# Patient Record
Sex: Male | Born: 1955 | Race: White | Hispanic: No | Marital: Married | Smoking: Current every day smoker
Health system: Southern US, Community
[De-identification: ages and names within clinical notes are randomized; demographics above are authoritative.]

## PROBLEM LIST (undated history)

## (undated) DIAGNOSIS — I898 Other specified noninfective disorders of lymphatic vessels and lymph nodes: Secondary | ICD-10-CM

## (undated) DIAGNOSIS — Z95810 Presence of automatic (implantable) cardiac defibrillator: Secondary | ICD-10-CM

## (undated) DIAGNOSIS — R739 Hyperglycemia, unspecified: Secondary | ICD-10-CM

## (undated) DIAGNOSIS — I2510 Atherosclerotic heart disease of native coronary artery without angina pectoris: Secondary | ICD-10-CM

## (undated) DIAGNOSIS — J189 Pneumonia, unspecified organism: Secondary | ICD-10-CM

## (undated) DIAGNOSIS — Z955 Presence of coronary angioplasty implant and graft: Secondary | ICD-10-CM

## (undated) DIAGNOSIS — M1990 Unspecified osteoarthritis, unspecified site: Secondary | ICD-10-CM

## (undated) DIAGNOSIS — R519 Headache, unspecified: Secondary | ICD-10-CM

## (undated) DIAGNOSIS — R51 Headache: Secondary | ICD-10-CM

## (undated) DIAGNOSIS — I469 Cardiac arrest, cause unspecified: Secondary | ICD-10-CM

## (undated) DIAGNOSIS — I213 ST elevation (STEMI) myocardial infarction of unspecified site: Secondary | ICD-10-CM

## (undated) DIAGNOSIS — C3490 Malignant neoplasm of unspecified part of unspecified bronchus or lung: Secondary | ICD-10-CM

## (undated) DIAGNOSIS — I472 Ventricular tachycardia: Secondary | ICD-10-CM

## (undated) DIAGNOSIS — I255 Ischemic cardiomyopathy: Secondary | ICD-10-CM

## (undated) DIAGNOSIS — E039 Hypothyroidism, unspecified: Secondary | ICD-10-CM

## (undated) DIAGNOSIS — Z9289 Personal history of other medical treatment: Secondary | ICD-10-CM

## (undated) DIAGNOSIS — K219 Gastro-esophageal reflux disease without esophagitis: Secondary | ICD-10-CM

## (undated) DIAGNOSIS — R7989 Other specified abnormal findings of blood chemistry: Secondary | ICD-10-CM

## (undated) HISTORY — PX: NO PAST SURGERIES: SHX2092

## (undated) HISTORY — DX: Personal history of other medical treatment: Z92.89

---

## 2008-11-22 DIAGNOSIS — C3490 Malignant neoplasm of unspecified part of unspecified bronchus or lung: Secondary | ICD-10-CM

## 2008-11-22 HISTORY — DX: Malignant neoplasm of unspecified part of unspecified bronchus or lung: C34.90

## 2008-12-23 ENCOUNTER — Ambulatory Visit: Payer: Self-pay | Admitting: Oncology

## 2008-12-31 ENCOUNTER — Ambulatory Visit: Payer: Self-pay | Admitting: Diagnostic Radiology

## 2008-12-31 ENCOUNTER — Ambulatory Visit (HOSPITAL_BASED_OUTPATIENT_CLINIC_OR_DEPARTMENT_OTHER): Admission: RE | Admit: 2008-12-31 | Discharge: 2008-12-31 | Payer: Self-pay | Admitting: Family Medicine

## 2009-01-01 ENCOUNTER — Ambulatory Visit: Payer: Self-pay | Admitting: Cardiothoracic Surgery

## 2009-01-02 ENCOUNTER — Ambulatory Visit: Payer: Self-pay | Admitting: Oncology

## 2009-01-03 ENCOUNTER — Ambulatory Visit: Payer: Self-pay | Admitting: General Surgery

## 2009-01-06 ENCOUNTER — Ambulatory Visit: Payer: Self-pay | Admitting: General Surgery

## 2009-01-20 ENCOUNTER — Ambulatory Visit: Payer: Self-pay | Admitting: Oncology

## 2009-01-20 ENCOUNTER — Ambulatory Visit: Payer: Self-pay | Admitting: Cardiothoracic Surgery

## 2009-02-20 ENCOUNTER — Ambulatory Visit: Payer: Self-pay | Admitting: Oncology

## 2009-02-20 ENCOUNTER — Ambulatory Visit: Payer: Self-pay | Admitting: Cardiothoracic Surgery

## 2009-03-22 ENCOUNTER — Ambulatory Visit: Payer: Self-pay | Admitting: Cardiothoracic Surgery

## 2009-03-22 ENCOUNTER — Ambulatory Visit: Payer: Self-pay | Admitting: Oncology

## 2009-04-22 ENCOUNTER — Ambulatory Visit: Payer: Self-pay | Admitting: Cardiothoracic Surgery

## 2009-04-22 ENCOUNTER — Ambulatory Visit: Payer: Self-pay | Admitting: Oncology

## 2009-05-22 ENCOUNTER — Ambulatory Visit: Payer: Self-pay | Admitting: Cardiothoracic Surgery

## 2009-05-22 ENCOUNTER — Ambulatory Visit: Payer: Self-pay | Admitting: Oncology

## 2009-06-22 ENCOUNTER — Ambulatory Visit: Payer: Self-pay | Admitting: Oncology

## 2009-06-22 ENCOUNTER — Ambulatory Visit: Payer: Self-pay | Admitting: Cardiothoracic Surgery

## 2009-07-23 ENCOUNTER — Ambulatory Visit: Payer: Self-pay | Admitting: Oncology

## 2009-07-23 ENCOUNTER — Ambulatory Visit: Payer: Self-pay | Admitting: Cardiothoracic Surgery

## 2009-10-22 ENCOUNTER — Ambulatory Visit: Payer: Self-pay | Admitting: Internal Medicine

## 2009-11-05 ENCOUNTER — Ambulatory Visit: Payer: Self-pay | Admitting: Radiation Oncology

## 2009-11-20 ENCOUNTER — Ambulatory Visit: Payer: Self-pay | Admitting: Internal Medicine

## 2009-11-22 ENCOUNTER — Ambulatory Visit: Payer: Self-pay | Admitting: Internal Medicine

## 2009-12-19 ENCOUNTER — Ambulatory Visit: Payer: Self-pay | Admitting: Oncology

## 2009-12-23 ENCOUNTER — Ambulatory Visit: Payer: Self-pay | Admitting: Oncology

## 2009-12-23 ENCOUNTER — Ambulatory Visit: Payer: Self-pay | Admitting: Internal Medicine

## 2010-03-22 ENCOUNTER — Ambulatory Visit: Payer: Self-pay | Admitting: Oncology

## 2010-03-23 ENCOUNTER — Ambulatory Visit: Payer: Self-pay | Admitting: Oncology

## 2010-04-17 ENCOUNTER — Ambulatory Visit: Payer: Self-pay | Admitting: Oncology

## 2010-04-22 ENCOUNTER — Ambulatory Visit: Payer: Self-pay | Admitting: Oncology

## 2010-06-22 ENCOUNTER — Ambulatory Visit: Payer: Self-pay | Admitting: Oncology

## 2010-07-22 ENCOUNTER — Ambulatory Visit: Payer: Self-pay | Admitting: Cardiothoracic Surgery

## 2010-07-23 ENCOUNTER — Ambulatory Visit: Payer: Self-pay | Admitting: Cardiothoracic Surgery

## 2010-09-09 ENCOUNTER — Ambulatory Visit: Payer: Self-pay | Admitting: Oncology

## 2010-09-24 ENCOUNTER — Ambulatory Visit: Payer: Self-pay | Admitting: Oncology

## 2010-10-22 ENCOUNTER — Ambulatory Visit: Payer: Self-pay | Admitting: Oncology

## 2010-11-22 ENCOUNTER — Ambulatory Visit: Payer: Self-pay | Admitting: Oncology

## 2012-07-23 DIAGNOSIS — J189 Pneumonia, unspecified organism: Secondary | ICD-10-CM

## 2012-07-23 HISTORY — PX: CORONARY ANGIOPLASTY WITH STENT PLACEMENT: SHX49

## 2012-07-23 HISTORY — DX: Pneumonia, unspecified organism: J18.9

## 2012-08-13 ENCOUNTER — Inpatient Hospital Stay (HOSPITAL_COMMUNITY)
Admission: EM | Admit: 2012-08-13 | Discharge: 2012-09-07 | DRG: 550 | Disposition: A | Discharge status: Home / Self Care | Payer: BC Managed Care – PPO | Attending: Cardiovascular Disease | Admitting: Cardiovascular Disease

## 2012-08-13 ENCOUNTER — Inpatient Hospital Stay (HOSPITAL_COMMUNITY): Payer: BC Managed Care – PPO

## 2012-08-13 ENCOUNTER — Encounter (HOSPITAL_COMMUNITY)
Admission: EM | Disposition: A | Discharge status: Home / Self Care | Payer: Self-pay | Source: Home / Self Care | Attending: Cardiovascular Disease

## 2012-08-13 ENCOUNTER — Emergency Department (HOSPITAL_COMMUNITY): Payer: BC Managed Care – PPO

## 2012-08-13 ENCOUNTER — Encounter (HOSPITAL_COMMUNITY): Payer: Self-pay

## 2012-08-13 DIAGNOSIS — I472 Ventricular tachycardia, unspecified: Secondary | ICD-10-CM | POA: Diagnosis present

## 2012-08-13 DIAGNOSIS — R579 Shock, unspecified: Secondary | ICD-10-CM | POA: Diagnosis present

## 2012-08-13 DIAGNOSIS — I2510 Atherosclerotic heart disease of native coronary artery without angina pectoris: Secondary | ICD-10-CM

## 2012-08-13 DIAGNOSIS — I2589 Other forms of chronic ischemic heart disease: Secondary | ICD-10-CM | POA: Diagnosis present

## 2012-08-13 DIAGNOSIS — I898 Other specified noninfective disorders of lymphatic vessels and lymph nodes: Secondary | ICD-10-CM | POA: Clinically undetermined

## 2012-08-13 DIAGNOSIS — R918 Other nonspecific abnormal finding of lung field: Secondary | ICD-10-CM

## 2012-08-13 DIAGNOSIS — F17200 Nicotine dependence, unspecified, uncomplicated: Secondary | ICD-10-CM | POA: Diagnosis present

## 2012-08-13 DIAGNOSIS — R0902 Hypoxemia: Secondary | ICD-10-CM

## 2012-08-13 DIAGNOSIS — Z85118 Personal history of other malignant neoplasm of bronchus and lung: Secondary | ICD-10-CM

## 2012-08-13 DIAGNOSIS — R7309 Other abnormal glucose: Secondary | ICD-10-CM | POA: Diagnosis not present

## 2012-08-13 DIAGNOSIS — Z950 Presence of cardiac pacemaker: Secondary | ICD-10-CM | POA: Diagnosis present

## 2012-08-13 DIAGNOSIS — Z955 Presence of coronary angioplasty implant and graft: Secondary | ICD-10-CM

## 2012-08-13 DIAGNOSIS — I2119 ST elevation (STEMI) myocardial infarction involving other coronary artery of inferior wall: Principal | ICD-10-CM | POA: Diagnosis present

## 2012-08-13 DIAGNOSIS — J189 Pneumonia, unspecified organism: Secondary | ICD-10-CM | POA: Diagnosis not present

## 2012-08-13 DIAGNOSIS — I213 ST elevation (STEMI) myocardial infarction of unspecified site: Secondary | ICD-10-CM

## 2012-08-13 DIAGNOSIS — E873 Alkalosis: Secondary | ICD-10-CM | POA: Diagnosis not present

## 2012-08-13 DIAGNOSIS — K219 Gastro-esophageal reflux disease without esophagitis: Secondary | ICD-10-CM

## 2012-08-13 DIAGNOSIS — J9600 Acute respiratory failure, unspecified whether with hypoxia or hypercapnia: Secondary | ICD-10-CM | POA: Diagnosis not present

## 2012-08-13 DIAGNOSIS — I4729 Other ventricular tachycardia: Secondary | ICD-10-CM | POA: Diagnosis present

## 2012-08-13 DIAGNOSIS — R7989 Other specified abnormal findings of blood chemistry: Secondary | ICD-10-CM | POA: Diagnosis not present

## 2012-08-13 DIAGNOSIS — R570 Cardiogenic shock: Secondary | ICD-10-CM | POA: Diagnosis present

## 2012-08-13 DIAGNOSIS — I5021 Acute systolic (congestive) heart failure: Secondary | ICD-10-CM | POA: Diagnosis not present

## 2012-08-13 DIAGNOSIS — I509 Heart failure, unspecified: Secondary | ICD-10-CM | POA: Diagnosis not present

## 2012-08-13 DIAGNOSIS — J90 Pleural effusion, not elsewhere classified: Secondary | ICD-10-CM | POA: Diagnosis not present

## 2012-08-13 DIAGNOSIS — I469 Cardiac arrest, cause unspecified: Secondary | ICD-10-CM | POA: Diagnosis not present

## 2012-08-13 DIAGNOSIS — R739 Hyperglycemia, unspecified: Secondary | ICD-10-CM

## 2012-08-13 DIAGNOSIS — E876 Hypokalemia: Secondary | ICD-10-CM | POA: Diagnosis not present

## 2012-08-13 DIAGNOSIS — J869 Pyothorax without fistula: Secondary | ICD-10-CM | POA: Diagnosis not present

## 2012-08-13 DIAGNOSIS — I959 Hypotension, unspecified: Secondary | ICD-10-CM | POA: Diagnosis present

## 2012-08-13 DIAGNOSIS — I255 Ischemic cardiomyopathy: Secondary | ICD-10-CM

## 2012-08-13 DIAGNOSIS — E871 Hypo-osmolality and hyponatremia: Secondary | ICD-10-CM | POA: Diagnosis not present

## 2012-08-13 HISTORY — DX: Atherosclerotic heart disease of native coronary artery without angina pectoris: I25.10

## 2012-08-13 HISTORY — DX: Presence of coronary angioplasty implant and graft: Z95.5

## 2012-08-13 HISTORY — DX: Ventricular tachycardia, unspecified: I47.20

## 2012-08-13 HISTORY — DX: Hyperglycemia, unspecified: R73.9

## 2012-08-13 HISTORY — DX: Gastro-esophageal reflux disease without esophagitis: K21.9

## 2012-08-13 HISTORY — PX: LEFT HEART CATHETERIZATION WITH CORONARY ANGIOGRAM: SHX5451

## 2012-08-13 HISTORY — DX: Ventricular tachycardia: I47.2

## 2012-08-13 HISTORY — DX: Ischemic cardiomyopathy: I25.5

## 2012-08-13 HISTORY — DX: Other specified abnormal findings of blood chemistry: R79.89

## 2012-08-13 HISTORY — DX: ST elevation (STEMI) myocardial infarction of unspecified site: I21.3

## 2012-08-13 HISTORY — DX: Other specified noninfective disorders of lymphatic vessels and lymph nodes: I89.8

## 2012-08-13 HISTORY — PX: PERCUTANEOUS CORONARY STENT INTERVENTION (PCI-S): SHX5485

## 2012-08-13 HISTORY — PX: TEMPORARY PACEMAKER INSERTION: SHX5471

## 2012-08-13 HISTORY — DX: Cardiac arrest, cause unspecified: I46.9

## 2012-08-13 LAB — CBC WITH DIFFERENTIAL/PLATELET
HCT: 41.8 % (ref 39.0–52.0)
Hemoglobin: 14.8 g/dL (ref 13.0–17.0)
Lymphocytes Relative: 13 % (ref 12–46)
Lymphs Abs: 1.5 10*3/uL (ref 0.7–4.0)
MCHC: 35.4 g/dL (ref 30.0–36.0)
Monocytes Absolute: 0.7 10*3/uL (ref 0.1–1.0)
Monocytes Relative: 6 % (ref 3–12)
Neutro Abs: 9.1 10*3/uL — ABNORMAL HIGH (ref 1.7–7.7)
RBC: 4.62 MIL/uL (ref 4.22–5.81)
WBC: 11.5 10*3/uL — ABNORMAL HIGH (ref 4.0–10.5)

## 2012-08-13 LAB — COMPREHENSIVE METABOLIC PANEL
Alkaline Phosphatase: 59 U/L (ref 39–117)
BUN: 14 mg/dL (ref 6–23)
CO2: 24 mEq/L (ref 19–32)
Chloride: 95 mEq/L — ABNORMAL LOW (ref 96–112)
Creatinine, Ser: 0.82 mg/dL (ref 0.50–1.35)
GFR calc non Af Amer: >90 mL/min (ref >90)
Glucose, Bld: 273 mg/dL — ABNORMAL HIGH (ref 70–99)
Total Bilirubin: 0.4 mg/dL (ref 0.3–1.2)

## 2012-08-13 LAB — TROPONIN I
Troponin I: <0.3 ng/mL (ref <0.30)
Troponin I: <0.3 ng/mL (ref <0.30)

## 2012-08-13 LAB — PROTIME-INR
INR: 1.11 (ref 0.00–1.49)
Prothrombin Time: 14.2 seconds (ref 11.6–15.2)

## 2012-08-13 LAB — CK TOTAL AND CKMB (NOT AT ARMC)
CK, MB: 5.3 ng/mL — ABNORMAL HIGH (ref 0.3–4.0)
Total CK: 173 U/L (ref 7–232)

## 2012-08-13 LAB — GLUCOSE, CAPILLARY: Glucose-Capillary: 105 mg/dL — ABNORMAL HIGH (ref 70–99)

## 2012-08-13 SURGERY — LEFT HEART CATHETERIZATION WITH CORONARY ANGIOGRAM
Anesthesia: LOCAL | Site: Groin | Laterality: Right

## 2012-08-13 MED ORDER — FENTANYL CITRATE 0.05 MG/ML IJ SOLN
INTRAMUSCULAR | Status: AC
  Filled 2012-08-13: qty 2

## 2012-08-13 MED ORDER — AMIODARONE HCL IN DEXTROSE 360-4.14 MG/200ML-% IV SOLN
30.0000 mg/h | INTRAVENOUS | Status: DC
  Administered 2012-08-13: 30 mg/h via INTRAVENOUS
  Administered 2012-08-14 – 2012-08-18 (×16): 60 mg/h via INTRAVENOUS
  Administered 2012-08-18 – 2012-08-20 (×4): 30 mg/h via INTRAVENOUS
  Filled 2012-08-13 (×45): qty 200

## 2012-08-13 MED ORDER — SODIUM CHLORIDE 0.9 % IV SOLN
0.2500 mg/kg/h | INTRAVENOUS | Status: AC
  Filled 2012-08-13: qty 250

## 2012-08-13 MED ORDER — HEPARIN (PORCINE) IN NACL 2-0.9 UNIT/ML-% IJ SOLN
INTRAMUSCULAR | Status: AC
  Filled 2012-08-13: qty 1000

## 2012-08-13 MED ORDER — ACETAMINOPHEN 325 MG PO TABS
650.0000 mg | ORAL_TABLET | ORAL | Status: DC | PRN
  Administered 2012-08-20 – 2012-08-21 (×3): 650 mg via ORAL
  Filled 2012-08-13 (×3): qty 2

## 2012-08-13 MED ORDER — METOPROLOL TARTRATE 1 MG/ML IV SOLN
INTRAVENOUS | Status: AC
  Filled 2012-08-13: qty 5

## 2012-08-13 MED ORDER — TICAGRELOR 90 MG PO TABS
ORAL_TABLET | ORAL | Status: AC
  Filled 2012-08-13: qty 2

## 2012-08-13 MED ORDER — MORPHINE SULFATE 2 MG/ML IJ SOLN: 2.0000 mg | INTRAMUSCULAR | Status: DC | PRN

## 2012-08-13 MED ORDER — MIDAZOLAM HCL 2 MG/2ML IJ SOLN
INTRAMUSCULAR | Status: AC
  Filled 2012-08-13: qty 2

## 2012-08-13 MED ORDER — DEXTROSE 5 % IV SOLN
60.0000 mg/h | Freq: Once | INTRAVENOUS | Status: AC
  Administered 2012-08-13: 150 mg via INTRAVENOUS
  Filled 2012-08-13: qty 9

## 2012-08-13 MED ORDER — METOPROLOL TARTRATE 1 MG/ML IV SOLN
5.0000 mg | Freq: Once | INTRAVENOUS | Status: AC
  Administered 2012-08-13: 5 mg via INTRAVENOUS

## 2012-08-13 MED ORDER — LIDOCAINE HCL (PF) 1 % IJ SOLN
INTRAMUSCULAR | Status: AC
  Filled 2012-08-13: qty 30

## 2012-08-13 MED ORDER — SODIUM CHLORIDE 0.9 % IV SOLN: 0.2500 mg/kg/h | INTRAVENOUS | Status: DC

## 2012-08-13 MED ORDER — SODIUM CHLORIDE 0.9 % IV SOLN
0.2500 mg/kg/h | INTRAVENOUS | Status: AC
  Administered 2012-08-13: 0.25 mg/kg/h via INTRAVENOUS
  Filled 2012-08-13: qty 250

## 2012-08-13 MED ORDER — HEPARIN SODIUM (PORCINE) 5000 UNIT/ML IJ SOLN
4000.0000 [IU] | Freq: Once | INTRAMUSCULAR | Status: AC
  Administered 2012-08-13: 4000 [IU] via INTRAVENOUS

## 2012-08-13 MED ORDER — ZOLPIDEM TARTRATE 5 MG PO TABS
10.0000 mg | ORAL_TABLET | Freq: Every evening | ORAL | Status: DC | PRN
  Administered 2012-08-13: 10 mg via ORAL
  Filled 2012-08-13: qty 2

## 2012-08-13 MED ORDER — SODIUM CHLORIDE 0.9 % IV SOLN
INTRAVENOUS | Status: DC
  Administered 2012-08-13: 23:00:00 via INTRAVENOUS
  Administered 2012-08-14: 50 mL/h via INTRAVENOUS
  Administered 2012-08-16: 08:00:00 via INTRAVENOUS

## 2012-08-13 MED ORDER — ALPRAZOLAM 0.25 MG PO TABS
0.2500 mg | ORAL_TABLET | Freq: Three times a day (TID) | ORAL | Status: DC | PRN
  Administered 2012-08-15: 0.25 mg via ORAL
  Filled 2012-08-13: qty 1

## 2012-08-13 MED ORDER — ONDANSETRON HCL 4 MG/2ML IJ SOLN
4.0000 mg | Freq: Four times a day (QID) | INTRAMUSCULAR | Status: DC | PRN
  Administered 2012-08-14 (×3): 4 mg via INTRAVENOUS
  Filled 2012-08-13 (×3): qty 2

## 2012-08-13 MED ORDER — TICAGRELOR 90 MG PO TABS
90.0000 mg | ORAL_TABLET | Freq: Two times a day (BID) | ORAL | Status: DC
  Administered 2012-08-14 – 2012-09-07 (×49): 90 mg via ORAL
  Filled 2012-08-13 (×52): qty 1

## 2012-08-13 MED ORDER — METOPROLOL TARTRATE 12.5 MG HALF TABLET
12.5000 mg | ORAL_TABLET | Freq: Two times a day (BID) | ORAL | Status: DC
  Administered 2012-08-13: 12.5 mg via ORAL
  Filled 2012-08-13 (×3): qty 1

## 2012-08-13 MED ORDER — NITROGLYCERIN 0.2 MG/ML ON CALL CATH LAB
INTRAVENOUS | Status: AC
  Filled 2012-08-13: qty 1

## 2012-08-13 MED ORDER — RAMIPRIL 1.25 MG PO CAPS
1.2500 mg | ORAL_CAPSULE | Freq: Every day | ORAL | Status: DC
  Administered 2012-08-14 – 2012-08-15 (×2): 1.25 mg via ORAL
  Filled 2012-08-13 (×5): qty 1

## 2012-08-13 MED ORDER — ASPIRIN EC 81 MG PO TBEC
81.0000 mg | DELAYED_RELEASE_TABLET | Freq: Every day | ORAL | Status: DC
  Administered 2012-08-14 – 2012-09-07 (×25): 81 mg via ORAL
  Filled 2012-08-13 (×25): qty 1

## 2012-08-13 MED ORDER — BIVALIRUDIN 250 MG IV SOLR
INTRAVENOUS | Status: AC
  Filled 2012-08-13: qty 250

## 2012-08-13 MED ORDER — ADENOSINE 6 MG/2ML IV SOLN
INTRAVENOUS | Status: AC
  Filled 2012-08-13: qty 8

## 2012-08-13 MED ORDER — INSULIN ASPART 100 UNIT/ML ~~LOC~~ SOLN
0.0000 [IU] | Freq: Three times a day (TID) | SUBCUTANEOUS | Status: DC
  Administered 2012-08-14: 2 [IU] via SUBCUTANEOUS
  Administered 2012-08-15 (×2): 1 [IU] via SUBCUTANEOUS

## 2012-08-13 NOTE — CV Procedure (Signed)
Emergent Cardiac Catheterization/ Temporary Pacemaker/PTCA-Stenting of RCA  [name] [name], 56 y.o., male  Full note dictated; See diagram in chart  DICTATION [phone] , [id number]  Ao: 89/56 LV 89/8/22  LM: nl LAD: ostial occlusion RI: patent LCx: 70% prox, 50% mid stenosis RCA: 70% prox, 50% mid; acute occlusion post crux with Timi 0 flow  Successful PTCA/PCI/Stent witn BM Vision 2.75 x 28 mm stent post dilated to 3.1 to 2.8 mm taper into PDA proximally  EF: 30 - 35% with significant anterolateral and inferior hypocontractility.  Pacemaker inserted into RV  With paced rhythm stabilization of recurrent VT.   Lennette Bihari, MD, [facility] 08/13/2012 4:56 PM

## 2012-08-13 NOTE — H&P (Signed)
[name] [name] is an 56 y.o. male.   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  EMS called and he was found to be in sustained V. Tach.  Originally given adenosine without change in rhythm,  Then a total of 2-150 mg boluses of Amiodarone.the patient was conscious through out  Ride to hospital.  He also rec'd 4 baby asa.   Mild chest discomfort.  No other associated symptoms except dizziness while in V tach.   In ER he rec'd another 150 mg of amio for sustained V. Tach.  And IV Lopressor and 4,00 units of Heparin.  Labs drawn in ER and CXR.  EKG appears true post. MI.   History of Lung cancer 3 years ago treated with 33 treatments of radiation and chemo as well.  He was told he had a mass above his heart.    Past Medical History  Diagnosis Date  . Cancer     lung  . STEMI (ST elevation myocardial infarction) 08/13/2012  . Sustained ventricular tachycardia with associated syncope 08/13/2012  . Hx of cancer of lung, 3 years ago treated with radiation and chemo and resolved 08/13/2012  . GERD (gastroesophageal reflux disease) 08/13/2012    History reviewed. No pertinent past surgical history.  History reviewed. No pertinent family history. Social History:  does not have a smoking history on file. He does not have any smokeless tobacco history on file. His alcohol and drug histories not on file. married.  Continued tobacco abuse, started age 43 stopped for 1 year after lung Ca, then resumed again.(TK)  Allergies: No Known Allergies  OUT PATIENT Medications PRN ibuprofen OTC PPI for indigestion since radiation   No results found for this or any previous visit (from the past 48 hour(s)). No results found.  ROS: General:no recent colds or fevers Skin:no rashes or ulcers HEENT:no blurred vision, dentures WR:UEAV chest pain with episode today PUL:no  SOB GI:+ indigestion since radiation, no diarrhea, constipation or melena GU:no hematuria, no dysuria WU:JWJXBJYNW pains Neuro:syncope today Endo:no diabetes, no thyroid disease   Blood pressure 125/70, pulse 186, SpO2 99.00%. PE: General:alert and oriented, anxious , dizzy with v. tach Skin:warm and dry brisk capillary refill HEENT:normocephalic, sclera clear, dentures in place Neck: 1 cm JVD, no carotid bruits Heart:S1S2 RRR at times rapid.  No [facility] Lungs:clear ant. Without rales rhonchi or wheezes Abd:+ BS, soft non tender, no masses palpated Ext:no edema Neuro:alert and oriented X 3, MAE, follows commands, + facial symmetry.    Assessment/Plan Active Problems:  STEMI (ST elevation myocardial infarction)  Sustained ventricular tachycardia with associated syncope  Hx of cancer of lung, 3 years ago treated with radiation and chemo and resolved  GERD (gastroesophageal reflux disease)  PLAN: IV heparin, IV amio, IV lopressor given.  To cath lab emergently for true post. MI and associated sustained v tach.  Labs pending.  [name],[name] 08/13/2012, 1:54 PM    Patient seen and examined. Agree with assessment and plan. Pt with recurrent VT treated with 3 boluses of amiodarone and IV Lopressor. ECG suggest IMI. Plan emergent cath with pacemaker and PCI if needed. Discussed with pt and wife.   Lennette Bihari, MD, [facility] 08/13/2012 3:33 PM

## 2012-08-13 NOTE — ED Provider Notes (Addendum)
History     CSN: [id number]  Arrival date & time 08/13/12  1310   First MD Initiated Contact with Patient 08/13/12 1337    Low 5 CAVEAT  UNSTABLE VITAL SIGNS  Chief Complaint  Patient presents with  . Loss of Consciousness   Seen on arrival chief complaint syncope and chest pain. (Consider location/radiation/quality/duration/timing/severity/associated sxs/prior treatment) HPI Patient called 911 today as he suffered a syncopal event while parked in his truck. He was apparently unconscious for approximately 45 minutes. He complained of slight anterior chest pain. On EMS presents he went in to a tachycardic wide-complex rhythm. EMS treated patient with adenosine 12 mg IV, without change rhythm. EMS then encoded to me. With a faxed 12-lead ECG which showed wide-complex tachycardia suggestive of ventricular tachycardia. I ordered amiodarone 150 mg IV bolus which transiently converted patient to normal sinus rhythm for approximately 2 minutes. A twelve-lead ECG performed after conversion showed normal sinus rhythm 75 beats per minute with acute inferior and posterior wall myocardial infarction. Code STEMI called while patient in the field. Patient then returned to wide-complex rhythm after initial amiodarone bolus administered. A second amiodarone bolus ordered by me was administered.  150 mg, without success in converting the patient. Upon arrival here patient complained of lightheadedness and minimal anterior chest discomfort. He was treated with an amiodarone drip at 1 mg per minute which converted patient to sinus rhythm within one or 2 minutes after amiodarone intravenous drip startED. Past Medical History  Diagnosis Date  . 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 No pertinent past surgical history.  History reviewed. No pertinent family history.  History  Substance Use Topics  . Smoking status: Not on file  . Smokeless  tobacco: Not on file  . Alcohol Use:    social history positive smoker occasional alcohol no drug use    Review of Systems  Unable to perform ROS: Unstable vital signs  Cardiovascular: Positive for chest pain.  Neurological: Positive for weakness.    Allergies  Review of patient's allergies indicates no known allergies.  Home Medications  No current outpatient prescriptions on file.  There were no vitals taken for this visit.  Physical Exam  Nursing note and vitals reviewed. Constitutional: He appears well-developed and well-nourished.  HENT:  Head: Normocephalic and atraumatic.  Eyes: Conjunctivae normal are normal. Pupils are equal, round, and reactive to light.  Neck: Neck supple. No tracheal deviation present. No thyromegaly present.  Cardiovascular: Regular rhythm.   No murmur heard.      Tachycardic  Pulmonary/Chest: Effort normal and breath sounds normal.  Abdominal: Soft. Bowel sounds are normal. He exhibits no distension. There is no tenderness.  Musculoskeletal: Normal range of motion. He exhibits no edema and no tenderness.  Neurological: He is alert. Coordination normal.  Skin: Skin is warm and dry. No rash noted.  Psychiatric: He has a normal mood and affect.    ED Course  Procedures (including critical care time)   Labs Reviewed  CK TOTAL AND CKMB  TROPONIN I  CBC WITH DIFFERENTIAL  COMPREHENSIVE METABOLIC PANEL  PROTIME-INR  TSH  MAGNESIUM  PHOSPHORUS   No results found.   Date: 09/22/20131313 PM  Rate: 190  Rhythm: ventricular tachycardia  QRS Axis: left  Intervals: normal  ST/T Wave abnormalities: nonspecific ST/T changes  Conduction Disutrbances:none  Narrative Interpretation:   Old EKG Reviewed: none available No diagnosis found.   Date: 08/13/2012 1317  PM  Rate: 90  Rhythm: normal sinus rhythm  QRS Axis: normal  Intervals: normal  ST/T Wave abnormalities: nonspecific T wave changes  Conduction Disutrbances:none  Narrative  Interpretation:   Old EKG Reviewed: none available Patient administered aspirin 324 mg by mouth and heparin 4000 unit intravenous bolus. Amiodarone drip started at 1 mg per minute. Patient broke to sinus rhythm within 5 minutes of amiodarone drip started and however ventricular tachycardia returned and patient became lightheaded. He denied chest pain upon return ventricular tachycardia Dr. [name] arrived and took over care of patient a prescription patient to cardiac catheterization laboratory Diagnosis #1 acute posterior wall STEMI Diagnosis #2 ventricular tachycardia  MDM  Patient taken to cardiac catheterization lab for treatment of dysrhythmia and acute MI CRITICAL CARE Performed by: Doug Sou   Total critical care time: 60 MINUTE  Critical care time was exclusive of separately billable procedures and treating other patients.  Critical care was necessary to treat or prevent imminent or life-threatening deterioration.  Critical care was time spent personally by me on the following activities: development of treatment plan with patient and/or surrogate as well as nursing, discussions with consultants, evaluation of patient's response to treatment, examination of patient, obtaining history from patient or surrogate, ordering and performing treatments and interventions, ordering and review of laboratory studies, ordering and review of radiographic studies, pulse oximetry and re-evaluation of patient's condition.        Doug Sou, MD 08/13/12 1349  Doug Sou, MD 08/13/12 1350

## 2012-08-13 NOTE — ED Notes (Signed)
Pt reports mild chest pain starting yesterday  and passed out in his truck, ST elevation, found in VT, given 300 amiodarone, 12 adenosine and 324 ASA

## 2012-08-13 NOTE — ED Notes (Signed)
To cath lab.

## 2012-08-14 DIAGNOSIS — R0902 Hypoxemia: Secondary | ICD-10-CM

## 2012-08-14 DIAGNOSIS — R222 Localized swelling, mass and lump, trunk: Secondary | ICD-10-CM

## 2012-08-14 DIAGNOSIS — R918 Other nonspecific abnormal finding of lung field: Secondary | ICD-10-CM | POA: Insufficient documentation

## 2012-08-14 DIAGNOSIS — I472 Ventricular tachycardia: Secondary | ICD-10-CM

## 2012-08-14 DIAGNOSIS — I2119 ST elevation (STEMI) myocardial infarction involving other coronary artery of inferior wall: Principal | ICD-10-CM

## 2012-08-14 DIAGNOSIS — Z85118 Personal history of other malignant neoplasm of bronchus and lung: Secondary | ICD-10-CM

## 2012-08-14 LAB — CBC
Hemoglobin: 13.3 g/dL (ref 13.0–17.0)
Platelets: 219 10*3/uL (ref 150–400)
RBC: 4.25 MIL/uL (ref 4.22–5.81)
WBC: 9.5 10*3/uL (ref 4.0–10.5)

## 2012-08-14 LAB — TROPONIN I: Troponin I: <0.3 ng/mL (ref <0.30)

## 2012-08-14 LAB — BASIC METABOLIC PANEL
BUN: 8 mg/dL (ref 6–23)
Calcium: 7.9 mg/dL — ABNORMAL LOW (ref 8.4–10.5)
Creatinine, Ser: 0.62 mg/dL (ref 0.50–1.35)
GFR calc Af Amer: >90 mL/min (ref >90)
GFR calc non Af Amer: >90 mL/min (ref >90)

## 2012-08-14 LAB — GLUCOSE, CAPILLARY: Glucose-Capillary: 144 mg/dL — ABNORMAL HIGH (ref 70–99)

## 2012-08-14 LAB — HEPARIN LEVEL (UNFRACTIONATED): Heparin Unfractionated: 0.37 IU/mL (ref 0.30–0.70)

## 2012-08-14 LAB — POCT ACTIVATED CLOTTING TIME: Activated Clotting Time: 579 seconds

## 2012-08-14 MED ORDER — METOPROLOL TARTRATE 1 MG/ML IV SOLN
INTRAVENOUS | Status: AC
  Administered 2012-08-14: 5 mg
  Filled 2012-08-14: qty 5

## 2012-08-14 MED ORDER — LIDOCAINE IN D5W 4-5 MG/ML-% IV SOLN
1.0000 mg/min | INTRAVENOUS | Status: AC
  Administered 2012-08-14: 2 mg/min via INTRAVENOUS
  Filled 2012-08-14 (×3): qty 500

## 2012-08-14 MED ORDER — METOPROLOL TARTRATE 1 MG/ML IV SOLN
5.0000 mg | Freq: Once | INTRAVENOUS | Status: AC
  Administered 2012-08-14: 5 mg via INTRAVENOUS

## 2012-08-14 MED ORDER — AMIODARONE LOAD VIA INFUSION
150.0000 mg | Freq: Once | INTRAVENOUS | Status: AC
  Administered 2012-08-14: 150 mg via INTRAVENOUS
  Filled 2012-08-14: qty 83.34

## 2012-08-14 MED ORDER — LIDOCAINE BOLUS VIA INFUSION
100.0000 mg | Freq: Once | INTRAVENOUS | Status: AC
  Administered 2012-08-14: 100 mg via INTRAVENOUS
  Filled 2012-08-14: qty 100

## 2012-08-14 MED ORDER — METOPROLOL TARTRATE 12.5 MG HALF TABLET
12.5000 mg | ORAL_TABLET | Freq: Four times a day (QID) | ORAL | Status: DC
  Administered 2012-08-14 – 2012-08-15 (×4): 12.5 mg via ORAL
  Filled 2012-08-14 (×16): qty 1

## 2012-08-14 MED ORDER — HEPARIN (PORCINE) IN NACL 100-0.45 UNIT/ML-% IJ SOLN
1450.0000 [IU]/h | INTRAMUSCULAR | Status: DC
  Administered 2012-08-14 – 2012-08-15 (×2): 1450 [IU]/h via INTRAVENOUS
  Filled 2012-08-14 (×3): qty 250

## 2012-08-14 MED ORDER — LIDOCAINE IN D5W 4-5 MG/ML-% IV SOLN
1.0000 mg/min | INTRAVENOUS | Status: DC
  Filled 2012-08-14: qty 250

## 2012-08-14 MED ORDER — AMIODARONE IV BOLUS ONLY 150 MG/100ML: 150.0000 mg | Freq: Once | INTRAVENOUS | Status: AC

## 2012-08-14 MED ORDER — ONDANSETRON HCL 4 MG/2ML IJ SOLN
4.0000 mg | INTRAMUSCULAR | Status: DC | PRN
  Administered 2012-08-15 – 2012-09-01 (×2): 4 mg via INTRAVENOUS
  Filled 2012-08-14 (×3): qty 2

## 2012-08-14 MED ORDER — FUROSEMIDE 10 MG/ML IJ SOLN
40.0000 mg | Freq: Once | INTRAMUSCULAR | Status: AC
  Administered 2012-08-14: 40 mg via INTRAVENOUS

## 2012-08-14 MED ORDER — LIDOCAINE IN D5W 4-5 MG/ML-% IV SOLN
2.0000 mg/min | INTRAVENOUS | Status: DC
  Administered 2012-08-14: 2 mg/min via INTRAVENOUS
  Filled 2012-08-14: qty 250

## 2012-08-14 MED ORDER — LEVALBUTEROL HCL 0.63 MG/3ML IN NEBU
0.6300 mg | INHALATION_SOLUTION | Freq: Four times a day (QID) | RESPIRATORY_TRACT | Status: DC | PRN
  Administered 2012-08-14 – 2012-08-15 (×2): 0.63 mg via RESPIRATORY_TRACT
  Filled 2012-08-14 (×4): qty 3

## 2012-08-14 MED ORDER — MAGNESIUM SULFATE 40 MG/ML IJ SOLN
2.0000 g | Freq: Once | INTRAMUSCULAR | Status: AC
  Administered 2012-08-14: 2 g via INTRAVENOUS
  Filled 2012-08-14: qty 50

## 2012-08-14 MED ORDER — PROCAINAMIDE HCL 100 MG/ML IJ SOLN
500.0000 mg | Freq: Once | INTRAVENOUS | Status: DC
  Filled 2012-08-14: qty 5

## 2012-08-14 MED ORDER — PROMETHAZINE HCL 25 MG/ML IJ SOLN
12.5000 mg | INTRAMUSCULAR | Status: DC | PRN
  Administered 2012-08-14: 12.5 mg via INTRAVENOUS
  Filled 2012-08-14: qty 1

## 2012-08-14 MED ORDER — AMIODARONE IV BOLUS ONLY 150 MG/100ML
150.0000 mg | Freq: Once | INTRAVENOUS | Status: AC
  Administered 2012-08-14: 150 mg via INTRAVENOUS

## 2012-08-14 MED ORDER — AMIODARONE IV BOLUS ONLY 150 MG/100ML
150.0000 mg | Freq: Once | INTRAVENOUS | Status: DC
  Administered 2012-08-14: 150 mg via INTRAVENOUS

## 2012-08-14 MED ORDER — FUROSEMIDE 10 MG/ML IJ SOLN
INTRAMUSCULAR | Status: AC
  Filled 2012-08-14: qty 4

## 2012-08-14 MED FILL — Dextrose Inj 5%: INTRAVENOUS | Qty: 50 | Status: AC

## 2012-08-14 NOTE — Cardiovascular Report (Signed)
NAMESAMEUL, TAGLE NO.:  [id number]  MEDICAL RECORD NO.:  [id number]  LOCATION:  2901                         FACILITY:  [facility]  PHYSICIAN:  Nicki Guadalajara, M.D.     DATE OF BIRTH:  1956-04-16  DATE OF PROCEDURE:  08/13/2012 DATE OF DISCHARGE:                           CARDIAC CATHETERIZATION   PROCEDURE:  Emergent cardiac catheterization:  Transvenous temporary pacemaker; coronary angiography; percutaneous coronary intervention with PTCA/stenting of right coronary artery; left ventriculography.  INDICATIONS:  [name] is a 56 year old gentleman who has long history of tobacco abuse and a history of previous lung cancer. Unfortunately, the patient again has resumed tobacco use.  Today, without any known prior cardiac history, he apparently "passed out." EMS was called and he was found to have sustained VT.  Initially, the EMS thought this may be a SVT and they gave Adenosine, without change in rhythm.  The patient then received amiodarone bolus.  A code STEMI was called.  He was taken to [facility] ER.  Upon arrival, he was still in the VT. He received an additional bolus of amiodarone, which did break him into sinus rhythm.  This did show T-wave inversions precordially with suggestion of mild ST elevation inferiorly.  The patient again developed recurrent VT in the ER.  At this time, I had arrived and I prescribed an additional 150 mg bolus of amiodarone in addition to IV Lopressor.  He was on an IV amiodarone drip.  The patient was then taken acutely to the cardiac catheterization laboratory.  PROCEDURE IN DETAIL:  The patient was prepped and draped in usual fashion.  He was given Versed 2 mg plus fentanyl 50 mcg.  Right femoral artery and right femoral vein were cannulated and a 6-French arterial and 7-French venous sheath were inserted.  The pacemaker was advanced to the RV apex.  With pacing at a heart rate of 89 beats per minute, the patient  maintained V-paced rhythm and it was no longer in recurrent ventricular tachycardia. A 6-French FL4 catheter was inserted into the left coronary artery.  Scout angiography revealed apparent occluded LAD at the origin, but this seem to have an old appearance and did not appear to be the acute vessel.  A right catheter was then inserted and selective angiography in the right coronary artery confirmed that the right coronary artery was the acute vessel with total occlusion in just beyond the acute margin with TIMI 0 flow.  The catheter was then exchanged for a 6-French right guiding catheter.  The patient received Angiomax bolus plus infusion.  Asahi medium wire was initially advanced and ultimately this was able to cross the total occlusion and was advanced somewhat further beyond this.  A 2-0 balloon was used with dilatation.  The wire then came up to more difficult longer reocclusion. Ultimately, the wire was then removed and replaced with an Intuition wire.  With balloon support, this wire was able to cross the more distal occlusion, which was at the takeoff of the PDA and PLA system. Dilatation was done with a 2-0 balloon at this site.  The balloon was then upgraded to a 2.5 x 20 mm Emerge balloon and multiple dilatations  were made.  The patient was then given Brilinta with the decision to place a bare-metal stent at this site.  180 mg of Brilinta were administered.  A 2.75 x 28 mm bare-metal Vision stent was then inserted and placed into the proximal PDA extending proximally into the distal RCA with joining of the continuation branch leading to the smaller PLA system.  This was dilated x2.  A 3.0 x 21 mm Noncompliant Sprinter Balloon was then used for post-stent dilatation with a tapering from 3.1 mm in the proximal portion of the stent just beyond the acute margin to approximately 2.8 mm in the ostium proximal portion of the PDA vessel. The 100% occlusion was reduced to 0%.  PDA now  was small-caliber vessel. There was collateralization to the LAD system via the right coronary system, which was further covered to evidence that the LAD most likely was the acute vessel.  Scout angiography confirmed an excellent angiographic result with brisk 3 TIMI-3 flow.  RAO ventriculography was then performed.  At this time, attempt was made to lower the pacemaker rate down to 60, but when the pacemaker rate dropped down to 60, the patient again started to have recurrent bursts of nonsustained VT, which subsided again once the pacemaker rate was advanced back up to 80 beats per minute.  The patient left the catheterization laboratory with stable hemodynamics with a pacemaker in place.  He was pain-free with resolution of his prior dizziness, which he had experienced in the ER when he was in VT.  HEMODYNAMIC DATA:  Central aortic pressure 89/56.  Left ventricular pressure 89/80. Post A-wave 22.  ANGIOGRAPHIC DATA:  Left main coronary artery was a long vessel, which trifurcated into an LAD, intermediate, and left circumflex system.  The LAD was occluded at the ostium and there was just a very minimal nubbin.  The intermediate vessel was free of significant disease.  The circumflex vessel was a large vessel that had 70% proximal stenosis before the first marginal vessel.  There was diffuse 50% mid stenosis.  The right coronary artery was moderate-sized vessel with 70% smooth proximal stenosis followed by 50% mid stenosis before the acute margin and before a RV marginal branch.  After the RV marginal branch, just beyond the acute margin, the vessel was 100% occluded with TIMI 0 flow. Once the vessel was opened with dilatation at this site and then more distally, there was a high-grade stenosis at the bifurcation of the PDA and PLA system.  Ultimately, the entire region was stented with a 2.75 x 28 mm bare-metal Vision stent, post dilated with stent taper from 3.1 to 2.8  extending into the proximal portion of the PDA to cover the entire diffusely diseased segment.  The whole region was reduced to 0% and there was brisk TIMI-3 flow.  The PDA was a large vessel, which extended to the posterior of the heart to the apex.  Collateralization was also present to the LAD system via the right injection.  RAO ventriculography revealed moderately severe LV dysfunction with moderate hypocontractility anteriorly and moderately severe inferior hypocontractility.  Acute ejection fraction was in the 30-35% range.  IMPRESSION: 1. Acute coronary syndrome/ST segment elevation myocardial infarction     secondary to total proximal right coronary artery occlusion. 2. Recurrent ventricular tachycardia. 3. Temporary pacemaker. 4. Severe multivessel coronary artery disease with total occlusion of     the left anterior descending in its ostium, 70% proximal, 50%     distal circumflex stenoses, and right  coronary artery with 70%     proximal, 50% mid, and total occlusion in the region of the acute     margin. 5. Successful percutaneous transluminal coronary angioplasty/stenting     of the right coronary artery with 100%     occlusion being reduced to 0%, with ultimate insertion of a 2.75 x     28 mm bare-metal Vision stent post dilated with stent taper to 3.1-     2.8 mm. 6. Bivalirudin/Brilinta/IC nitroglycerin.          ______________________________ Nicki Guadalajara, M.D.     TK/MEDQ  D:  08/13/2012  T:  08/14/2012  Job:  [id number]

## 2012-08-14 NOTE — Progress Notes (Signed)
Corine Shelter PA paged. Patient having runs of VT but is alert and oriented at the current time. Orders received for IV Lopessor 5mg  and 150mg  Amio bolus from gtt currently running. PA states he will be at bedside promptly.

## 2012-08-14 NOTE — Consult Note (Signed)
Name: [name] [name] MRN: [id number] DOB: 1956-06-06    LOS: 1  Referring Provider:  [name] (cards) Reason for Referral:  Recurrent VT, hx small cell lung ca  PULMONARY / CRITICAL CARE MEDICINE  HPI:  55yo male with hx CAD, small cell lung ca (rx 3 years ago with chemo and xrt) with ??residual R hilar mass, ischemic cardiomyopathy with EF 30% presented 9/22 with syncope and sustained VTach and ECG suggestive of acute MI.  He was admitted by cardiology and given IV amiodarone and taken emergently to cath lab where he had PTCA/stent and pacemaker placement. On am 9/23 he developed further recurrent VT with shock x 2, amiodarone, lidocaine, Mg+ and PCCM asked to consult. Pt did not require intubation during ACLS.   Past Medical History  Diagnosis Date  . Cancer     lung  . STEMI (ST elevation myocardial infarction) 08/13/2012  . Sustained ventricular tachycardia with associated syncope 08/13/2012  . Hx of cancer of lung, 3 years ago treated with radiation and chemo and resolved 08/13/2012  . GERD (gastroesophageal reflux disease) 08/13/2012  . Hyperglycemia 08/13/2012  . Cardiomyopathy, ischemic, by cardiac cath 30-35% 08/13/12  08/13/2012  . CAD (coronary artery disease) 3 vessel disease 08/13/2012  . S/P coronary artery stent placement, acutely to RCA for acute Post. MI with BMS, 08/13/12 08/13/2012   Past Surgical History  Procedure Date  . No past surgeries   . Cardiac catheterization    Prior to Admission medications   Medication Sig Start Date End Date Taking? Authorizing Provider  ibuprofen (ADVIL,MOTRIN) 200 MG tablet Take 600 mg by mouth every 6 (six) hours as needed. For pain   Yes Historical Provider, MD  omeprazole (PRILOSEC) 20 MG capsule Take 20 mg by mouth daily as needed. For heartburn   Yes Historical Provider, MD   Allergies No Known Allergies  Family History History reviewed. No pertinent family history. Social History  reports that he has been smoking.  He does not have  any smokeless tobacco history on file. He reports that he does not drink alcohol or use illicit drugs.  Review Of Systems:  No c/o.  Denies chest pain, SOB.  All other systems reviewed and were neg.   Vital Signs: Temp:  [97.4 F (36.3 C)-98.2 F (36.8 C)] 97.8 F (36.6 C) (09/23 0700) Pulse Rate:  [68-186] 80  (09/23 0700) Resp:  [15-37] 24  (09/23 0700) BP: (82-125)/(52-80) 99/78 mmHg (09/23 0700) SpO2:  [94 %-100 %] 98 % (09/23 0700) Weight:  [220 lb 7.4 oz (100 kg)-231 lb 7.7 oz (105 kg)] 231 lb 7.7 oz (105 kg) (09/22 1530)  Physical Examination: General:  wdwn male, NAD  Neuro:  Awake, alert, appropriate HEENT:  Mm dry, no JVD  Cardiovascular:  s1s2 V paced  Lungs:  resps even non labored on [place], CTA Abdomen:  Soft, +bs Ext: warm and dry, no edema   Principal Problem:  *STEMI (ST elevation myocardial infarction) Active Problems:  Sustained ventricular tachycardia with associated syncope  Hx of cancer of lung, 3 years ago treated with radiation and chemo and resolved  GERD (gastroesophageal reflux disease)  Hyperglycemia, pt has been on meds for diabetes in the past  Cardiomyopathy, ischemic, by cardiac cath 30-35% 08/13/12   CAD (coronary artery disease) 3 vessel disease  S/P coronary artery stent placement, acutely to RCA for acute Post. MI with BMS, 08/13/12  Pacemaker, temporary placed 08/13/12   ASSESSMENT AND PLAN  Recurrent VT - s/p pacemaker, stenting in  cath lab Acute MI Hx lung ca - small cell -- s/p chemo, xrt with ?residual R hilar mass. No recent imaging. ?? Enlarging mass contributing to VT   PLAN -  CT chest with contrast -- will f/u after CT chest.  ??residual/enlarging mass v radiation pneumonitis. If this is indeed recurrence of cancer will likely need palliative radiation.   O2 as needed  VT management per cards    BEST PRACTICE / DISPOSITION Level of Care:  ICU Primary Service:  cardiology Consultants:  none Code Status:  full Diet:  NPO DVT  Px:  heparin GI Px:  n/i Skin Integrity:  intact Social / Family:  Updated at bedside   [facility], NP Pulmonary and Critical Care Medicine [facility] Pager: [phone]  08/14/2012, 9:25 AM  Will order repeat CT to differentiate if the opacifications seen on CT are tissue (making it more likely to be oncologic in origin) or simple radiation burns.  If additional tissue is noted there then this is likely recurrence of his cancer and palliative radiation would be recommended.  Will F/U post CT.  Patient seen and examined, agree with above note.  I dictated the care and orders written for this patient under my direction.  [name], M.D. [phone]

## 2012-08-14 NOTE — Progress Notes (Signed)
8:21 code called with patient in symptomatic VT. Corine Shelter at bedside and Dr [name] on the way. Zoll already in place and patient shocked promptly. See code sheet.

## 2012-08-14 NOTE — Progress Notes (Deleted)
ANTICOAGULATION CONSULT NOTE - Initial Consult  Pharmacy Consult for Heparin Indication: chest pain/ACS with continued VT/VF  No Known Allergies  Patient Measurements: Height: 6\' 5"  (195.6 cm) Weight: 231 lb 7.7 oz (105 kg) IBW/kg (Calculated) : 89.1  Heparin Dosing Weight: 105 kg  Vital Signs: Temp: 97.8 F (36.6 C) (09/23 0700) Temp src: Oral (09/23 0700) BP: 99/78 mmHg (09/23 0700) Pulse Rate: 80  (09/23 0700)  Labs:  Basename 08/14/12 0549 08/14/12 0530 08/14/12 0130 08/13/12 1900 08/13/12 1324  HGB -- 13.3 -- -- 14.8  HCT -- 38.7* -- -- 41.8  PLT -- 219 -- -- 217  APTT -- -- -- -- --  LABPROT -- -- -- -- 14.2  INR -- -- -- -- 1.11  HEPARINUNFRC -- -- -- -- --  CREATININE -- -- 0.62 -- 0.82  CKTOTAL -- -- -- 173 167  CKMB -- -- -- 5.3* 4.2*  TROPONINI <0.30 -- <0.30 <0.30 --    Estimated Creatinine Clearance: 133 ml/min (by C-G formula based on Cr of 0.62).   Medical History: Past Medical History  Diagnosis Date  . Cancer     lung  . STEMI (ST elevation myocardial infarction) 08/13/2012  . Sustained ventricular tachycardia with associated syncope 08/13/2012  . Hx of cancer of lung, 3 years ago treated with radiation and chemo and resolved 08/13/2012  . GERD (gastroesophageal reflux disease) 08/13/2012  . Hyperglycemia 08/13/2012  . Cardiomyopathy, ischemic, by cardiac cath 30-35% 08/13/12  08/13/2012  . CAD (coronary artery disease) 3 vessel disease 08/13/2012  . S/P coronary artery stent placement, acutely to RCA for acute Post. MI with BMS, 08/13/12 08/13/2012    Medications:  Scheduled:     . adenosine      . amiodarone (CORDARONE) infusion  60 mg/hr Intravenous Once  . amiodarone  150 mg Intravenous Once  . amiodarone  150 mg Intravenous Once  . amiodarone  150 mg Intravenous Once  . aspirin EC  81 mg Oral Daily  . bivalirudin      . bivalirudin (ANGIOMAX) infusion 5 mg/mL (Cath Lab,ACS,PCI indication)  0.25 mg/kg/hr Intravenous To Cath  . fentaNYL        . heparin      . heparin  4,000 Units Intravenous Once  . insulin aspart  0-9 Units Subcutaneous TID WC  . lidocaine      . lidocaine  100 mg Intravenous Once  . magnesium sulfate 1 - 4 g bolus IVPB  2 g Intravenous Once  . metoprolol      . metoprolol  5 mg Intravenous Once  . metoprolol  5 mg Intravenous Once  . metoprolol tartrate  12.5 mg Oral Q6H  . midazolam      . midazolam      . nitroGLYCERIN      . ramipril  1.25 mg Oral Daily  . Ticagrelor      . Ticagrelor  90 mg Oral BID  . DISCONTD: amiodarone  150 mg Intravenous Once  . DISCONTD: bivalirudin (ANGIOMAX) infusion 5 mg/mL (Cath Lab,ACS,PCI indication)  0.25 mg/kg/hr Intravenous To Cath  . DISCONTD: metoprolol tartrate  12.5 mg Oral BID  . DISCONTD: procainamide (PRONESTYL) BOLUS IVPB  500 mg Intravenous Once    Assessment: 56 yom admitted for STEMI with continued VT/VF; previously received heparin bolus 4000 units in ED and s/p PTCA-stenting to RCA.  Rx consulted for initiation of anticoagulation with heparin. PTA negative for previous anticoagulation medication use. Baseline CBC WNL.   Goal of  Therapy:  Heparin level 0.3-0.7 units/ml Monitor platelets by anticoagulation protocol: Yes   Plan:  Start heparin infusion at 1450 units/hr Check anti-Xa level in 6 hours and daily while on heparin Continue to monitor H&H and platelets and s/sx of bleeding  Tiney Rouge PharmD Candidate  -- I have reviewed the plan and agree with the above  Harland German, Pharm D 08/14/2012 9:46 AM

## 2012-08-14 NOTE — Code Documentation (Signed)
CODE BLUE NOTE  Patient Name: [name] [name]   MRN: [id number]   Date of Birth/ Sex: 05-04-1956 , male      Admission Date: 08/13/2012  Attending Provider: Lennette Bihari, MD  Primary Diagnosis: STEMI (ST elevation myocardial infarction)    Indication: Pt  S/p emergent cath 08/13/2012. This am No chest pain. Runs of symptomatic NSVT this am. He required shock X 2., Amiodarone bolus, Mg++ 2gm, Lidocaine and increased pacer rate. He continued to have VT/VF.  Code blue was subsequently called. At the time of arrival on scene, ACLS protocol was underway.  Defibrillated 120J x2 Amiodarone titrated 30--> 60 Lidocaine bolus 100mg  --> Lidocaine gtt Mg 2gm IV Lopressor AMiodarone 150 mg bolus x2 Debrillated 120J x1  -No bag ventilation, no compression, pt alert and responsive      Technical Description:  - CPR performance duration:  29 minutes   - Was defibrillation or cardioversion used? Yes   - Was external pacer placed? Yes  - Was patient intubated pre/post CPR? No    Medications Administered: Y = Yes; Blank = No Amiodarone    Atropine    Calcium    Epinephrine    Lidocaine    Magnesium    Norepinephrine    Phenylephrine    Sodium bicarbonate    Vasopressin      Post CPR evaluation:  - Final Status - Was patient successfully resuscitated ? Yes - What is current rhythm? NSR with PVCs - What is current hemodynamic status? guarded   Miscellaneous Information:  - Labs sent, including: no  - Primary team notified?  Yes  - Family Notified? Yes  - Additional notes/ transfer status: Transfer to CCU   Manuela Schwartz, MD  08/14/2012, 8:50 AM

## 2012-08-14 NOTE — Progress Notes (Signed)
ANTICOAGULATION CONSULT NOTE - Initial Consult  Pharmacy Consult for Heparin Indication: chest pain/ACS with continued VT/VF  No Known Allergies  Patient Measurements: Height: 6\' 5"  (195.6 cm) Weight: 231 lb 7.7 oz (105 kg) IBW/kg (Calculated) : 89.1  Heparin Dosing Weight: 105 kg  Vital Signs: Temp: 97.9 F (36.6 C) (09/23 1608) Temp src: Oral (09/23 1608) BP: 88/67 mmHg (09/23 1900) Pulse Rate: 89  (09/23 1900)  Labs:  Basename 08/14/12 1800 08/14/12 0549 08/14/12 0530 08/14/12 0130 08/13/12 1900 08/13/12 1324  HGB -- -- 13.3 -- -- 14.8  HCT -- -- 38.7* -- -- 41.8  PLT -- -- 219 -- -- 217  APTT -- -- -- -- -- --  LABPROT -- -- -- -- -- 14.2  INR -- -- -- -- -- 1.11  HEPARINUNFRC 0.37 -- -- -- -- --  CREATININE -- -- -- 0.62 -- 0.82  CKTOTAL -- -- -- -- 173 167  CKMB -- -- -- -- 5.3* 4.2*  TROPONINI -- <0.30 -- <0.30 <0.30 --    Estimated Creatinine Clearance: 133 ml/min (by C-G formula based on Cr of 0.62).   Medical History: Past Medical History  Diagnosis Date  . Cancer     lung  . STEMI (ST elevation myocardial infarction) 08/13/2012  . Sustained ventricular tachycardia with associated syncope 08/13/2012  . Hx of cancer of lung, 3 years ago treated with radiation and chemo and resolved 08/13/2012  . GERD (gastroesophageal reflux disease) 08/13/2012  . Hyperglycemia 08/13/2012  . Cardiomyopathy, ischemic, by cardiac cath 30-35% 08/13/12  08/13/2012  . CAD (coronary artery disease) 3 vessel disease 08/13/2012  . S/P coronary artery stent placement, acutely to RCA for acute Post. MI with BMS, 08/13/12 08/13/2012    Medications:  Scheduled:     . adenosine      . amiodarone  150 mg Intravenous Once  . amiodarone  150 mg Intravenous Once  . amiodarone  150 mg Intravenous Once  . aspirin EC  81 mg Oral Daily  . furosemide      . furosemide  40 mg Intravenous Once  . insulin aspart  0-9 Units Subcutaneous TID WC  . lidocaine  100 mg Intravenous Once  . lidocaine   100 mg Intravenous Once  . magnesium sulfate 1 - 4 g bolus IVPB  2 g Intravenous Once  . metoprolol      . metoprolol  5 mg Intravenous Once  . metoprolol tartrate  12.5 mg Oral Q6H  . ramipril  1.25 mg Oral Daily  . Ticagrelor  90 mg Oral BID  . DISCONTD: amiodarone  150 mg Intravenous Once  . DISCONTD: metoprolol tartrate  12.5 mg Oral BID  . DISCONTD: procainamide (PRONESTYL) BOLUS IVPB  500 mg Intravenous Once    Assessment: Heparin level is 0.37 on 1450 units/hr in this 56 yom admitted for STEMI with continued VT/VF.  Currently heparin level is therapeutic. No bleeding noted. S/p emergent successful PTCA/PCI/stent 9/22 PM. Baseline CBC WNL.    Goal of Therapy:  Heparin level 0.3-0.7 units/ml Monitor platelets by anticoagulation protocol: Yes   Plan:  Continue IV heparin drip at 1450 units/hr. Check heparin level and CBC in AM.   [name] [name], RPh Clinical Pharmacist Pager: [phone] 08/14/2012 7:45 PM

## 2012-08-14 NOTE — Care Management Note (Signed)
    Page 1 of 2   09/07/2012     3:09:53 PM   CARE MANAGEMENT NOTE 09/07/2012  Patient:  [name] [name], [name] [name]   Account Number:  [id number]  Date Initiated:  08/14/2012  Documentation initiated by:  Alvira Philips Assessment:   56 yr-old male adm with dx of STEMI; lives with spouse, independent PTA     Action/Plan:   Anticipated DC Date:  09/01/2012   Anticipated DC Plan:  HOME W HOME HEALTH SERVICES      DC Planning Services  CM consult  Medication Assistance  PCP issues      Choice offered to / List presented to:     DME arranged  OTHER - SEE COMMENT           Status of service:  Completed, signed off Medicare Important Message given?   (If response is "NO", the following Medicare IM given date fields will be blank) Date Medicare IM given:   Date Additional Medicare IM given:    Discharge Disposition:  HOME/SELF CARE  Per UR Regulation:  Reviewed for med. necessity/level of care/duration of stay  If discussed at Long Length of Stay Meetings, dates discussed:   08/22/2012  08/24/2012  08/29/2012  08/31/2012  09/05/2012  09/07/2012    Comments:  09/07/12 [name] [name],R,BSN [phone] PT FOR DISCHARGE HOME TODAY WITH WIFE.  [employer] HAS BEEN FITTED, AND PT HAS HAD TEACHING.  MET WITH PT AND WIFE RE: HOME HEALTH NEEDS:  THEY DENY ANY HOME NEEDS.  PT CURRENTLY AMBULATING WELL WITHOUT ASSISTIVE DEVICE.  09/01/12 [name] Stankiewicz,RN,BSN [phone] TALC PLEURODESIS DONE TODAY AT BEDSIDE.  PT REMAINS ON TPN AND CL DIET. PLANNING DC HOME WITH [employer].   WILL CONT TO FOLLOW PROGRESS, WHICH HAS BEEN SOMEWHAT SLOW.  08/23/12 1100 [name] Mayo RN MSN CCM Talked with spouse re PT recommendations.  She agrees with pt that he is progressing so well he will not need PT - also states they have a cane and walker @ home that belonged to her parents.  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  08/22/12 1600 [name] Mayo RN MSN CCM PT recommends home health PT and  rolling walker.  Discussed with pt who declines referral - requests CM wait until he is ready for discharge - states he feels he will need neither one.  9/30 12n [name] dowell rn,bsn during rounds staff mentioned pt may need lifevest. have placed lifevest form on chart if needed.  08/16/12 0930 [name] Mayo RN MSN CCM Spouse has insurance card received in the mail with effective date of 08/15/12. 1300 Copay check for Brilinta reveals $80 for 30-day supply.  08/14/12 1200 [name] Mayo RN MSN CCM Pt has no insurance as he started a new job and insurance had not gone into effect.  Pt will d/c on Brilinta--provided card for free 30 day supply and pharmaceutical company patient assistance application.  Pt has no PCP--provided information on [facility] in [place], address and telephone number.

## 2012-08-14 NOTE — Progress Notes (Signed)
ANTICOAGULATION CONSULT NOTE - Initial Consult  Pharmacy Consult for Heparin Indication: chest pain/ACS with continued VT/VF  No Known Allergies  Patient Measurements: Height: 6\' 5"  (195.6 cm) Weight: 231 lb 7.7 oz (105 kg) IBW/kg (Calculated) : 89.1  Heparin Dosing Weight: 105 kg  Vital Signs: Temp: 97.8 F (36.6 C) (09/23 0700) Temp src: Oral (09/23 0700) BP: 99/78 mmHg (09/23 0700) Pulse Rate: 80  (09/23 0700)  Labs:  Basename 08/14/12 0549 08/14/12 0530 08/14/12 0130 08/13/12 1900 08/13/12 1324  HGB -- 13.3 -- -- 14.8  HCT -- 38.7* -- -- 41.8  PLT -- 219 -- -- 217  APTT -- -- -- -- --  LABPROT -- -- -- -- 14.2  INR -- -- -- -- 1.11  HEPARINUNFRC -- -- -- -- --  CREATININE -- -- 0.62 -- 0.82  CKTOTAL -- -- -- 173 167  CKMB -- -- -- 5.3* 4.2*  TROPONINI <0.30 -- <0.30 <0.30 --    Estimated Creatinine Clearance: 133 ml/min (by C-G formula based on Cr of 0.62).   Medical History: Past Medical History  Diagnosis Date  . Cancer     lung  . STEMI (ST elevation myocardial infarction) 08/13/2012  . Sustained ventricular tachycardia with associated syncope 08/13/2012  . Hx of cancer of lung, 3 years ago treated with radiation and chemo and resolved 08/13/2012  . GERD (gastroesophageal reflux disease) 08/13/2012  . Hyperglycemia 08/13/2012  . Cardiomyopathy, ischemic, by cardiac cath 30-35% 08/13/12  08/13/2012  . CAD (coronary artery disease) 3 vessel disease 08/13/2012  . S/P coronary artery stent placement, acutely to RCA for acute Post. MI with BMS, 08/13/12 08/13/2012    Medications:  Scheduled:    . adenosine      . amiodarone (CORDARONE) infusion  60 mg/hr Intravenous Once  . amiodarone  150 mg Intravenous Once  . amiodarone  150 mg Intravenous Once  . amiodarone  150 mg Intravenous Once  . aspirin EC  81 mg Oral Daily  . bivalirudin      . bivalirudin (ANGIOMAX) infusion 5 mg/mL (Cath Lab,ACS,PCI indication)  0.25 mg/kg/hr Intravenous To Cath  . fentaNYL        . heparin      . heparin  4,000 Units Intravenous Once  . insulin aspart  0-9 Units Subcutaneous TID WC  . lidocaine      . lidocaine  100 mg Intravenous Once  . magnesium sulfate 1 - 4 g bolus IVPB  2 g Intravenous Once  . metoprolol      . metoprolol  5 mg Intravenous Once  . metoprolol  5 mg Intravenous Once  . metoprolol tartrate  12.5 mg Oral Q6H  . midazolam      . midazolam      . nitroGLYCERIN      . ramipril  1.25 mg Oral Daily  . Ticagrelor      . Ticagrelor  90 mg Oral BID  . DISCONTD: amiodarone  150 mg Intravenous Once  . DISCONTD: bivalirudin (ANGIOMAX) infusion 5 mg/mL (Cath Lab,ACS,PCI indication)  0.25 mg/kg/hr Intravenous To Cath  . DISCONTD: metoprolol tartrate  12.5 mg Oral BID  . DISCONTD: procainamide (PRONESTYL) BOLUS IVPB  500 mg Intravenous Once    Assessment: 56 yom admitted for STEMI with continued VT/VF; previously received heparin bolus 4000 units in ED and s/p PTCA-stenting to RCA  (9/22).  Rx consulted for initiation of  heparin infusion with no bolus d/t bleeding risk. PTA negative for previous anticoagulation medication use.  Baseline CBC WNL.   Goal of Therapy:  Heparin level 0.3-0.7 units/ml Monitor platelets by anticoagulation protocol: Yes   Plan:  Start heparin infusion at 1450 units/hr Check anti-Xa level in 6 hours and daily while on heparin Continue to monitor H&H and platelets and s/sx of bleeding  Tiney Rouge, PharmD Candidate 08/14/2012,9:30 AM  I reviewed the plan above and agree with the recommendations  Harland German, Pharm D 08/14/2012 10:11 AM

## 2012-08-14 NOTE — Consult Note (Signed)
ELECTROPHYSIOLOGY CONSULT NOTE  Patient ID: [name] [name] MRN: [id number], DOB/AGE: January 17, 1956   Admit date: 08/13/2012 Date of Consult: 08/14/2012  Primary Physician: No primary provider on file. Primary Cardiologist: Nicki Guadalajara, MD Reason for Consultation: Sustained VT and Torsaides  History of Present Illness: The patient is a 56 yo man with lung Cancer, s/p chemo and radiation, still smoking who had experienced intermittent chest pain and sob for several days, maybe weeks. He presented with syncope and was found to be in sustained VT. He was treated with DCCV and subsequent ECG demonstrated STT changes and he was taken emergently to catheterization where multivessel disease was demonstrated and he underwent stenting of an occluded PDA. He also had Prox LAD disease. His EF 35%. The patient was placed on IV amio and had a temp TV PPM placed. Today he had recurrent VT, this time polymorphic associated with hemodynamic instability and subsequent defibrillation. He has calmed down from an arrhythmia perspective with IV Amio, Mg, lidocaine and beta blocker. He denies chest pain or sob. No prior syncope until this hospitalization.  Past Medical History Past Medical History  Diagnosis Date  . Cancer     lung  . STEMI (ST elevation myocardial infarction) 08/13/2012  . Sustained ventricular tachycardia with associated syncope 08/13/2012  . Hx of cancer of lung, 3 years ago treated with radiation and chemo and resolved 08/13/2012  . GERD (gastroesophageal reflux disease) 08/13/2012  . Hyperglycemia 08/13/2012  . Cardiomyopathy, ischemic, by cardiac cath 30-35% 08/13/12  08/13/2012  . CAD (coronary artery disease) 3 vessel disease 08/13/2012  . S/P coronary artery stent placement, acutely to RCA for acute Post. MI with BMS, 08/13/12 08/13/2012    Past Surgical History Past Surgical History  Procedure Date  . No past surgeries   . Cardiac catheterization      Allergies/Intolerances No Known  Allergies  Inpatient Medications . adenosine      . amiodarone  150 mg Intravenous Once  . amiodarone  150 mg Intravenous Once  . amiodarone  150 mg Intravenous Once  . aspirin EC  81 mg Oral Daily  . furosemide      . furosemide  40 mg Intravenous Once  . insulin aspart  0-9 Units Subcutaneous TID WC  . lidocaine  100 mg Intravenous Once  . magnesium sulfate 1 - 4 g bolus IVPB  2 g Intravenous Once  . metoprolol      . metoprolol  5 mg Intravenous Once  . metoprolol tartrate  12.5 mg Oral Q6H  . ramipril  1.25 mg Oral Daily  . Ticagrelor  90 mg Oral BID  . DISCONTD: amiodarone  150 mg Intravenous Once  . DISCONTD: metoprolol tartrate  12.5 mg Oral BID  . DISCONTD: procainamide (PRONESTYL) BOLUS IVPB  500 mg Intravenous Once   . sodium chloride 100 mL/hr at 08/13/12 2232  . amiodarone (NEXTERONE PREMIX) 360 mg/200 mL dextrose 60 mg/hr (08/14/12 1514)  . bivalirudin (ANGIOMAX) infusion 5 mg/mL (Cath Lab,ACS,PCI indication)    . heparin 1,450 Units/hr (08/14/12 1147)  . lidocaine    . DISCONTD: lidocaine 2 mg/min (08/14/12 0836)  . DISCONTD: lidocaine      Family History History reviewed. No pertinent family history.   Social History Social History  . Marital Status: Married   Social History Main Topics  . Smoking status: Current Every Day Smoker -- 2.0 packs/day  . Smokeless tobacco: Not on file  . Alcohol Use: No  . Drug Use: No  .  Sexually Active:    Review of Systems General: No chills, fever, night sweats or weight changes  Cardiovascular: No chest pain, dyspnea on exertion, edema, orthopnea, palpitations, paroxysmal nocturnal dyspnea Dermatological: No rash, lesions or masses Respiratory: No cough, dyspnea Urologic: No hematuria, dysuria Abdominal: No nausea, vomiting, diarrhea, bright red blood per rectum, melena, or hematemesis Neurologic: No visual changes, weakness, changes in mental status All other systems reviewed and are otherwise negative except as  noted above.  Physical Exam Blood pressure 125/73, pulse 89, temperature 97.9 F (36.6 C), temperature source Oral, resp. rate 20, height 6\' 5"  (1.956 m), weight 231 lb 7.7 oz (105 kg), SpO2 97.00%.  General: Well developed 56 year old male in no acute distress. HEENT: Normocephalic, atraumatic. EOMs intact. Sclera nonicteric. Oropharynx clear.  Neck: Supple without bruits. No JVD. Lungs:  Respirations regular and unlabored, CTA bilaterally. No wheezes, rales or rhonchi. Heart: RRR. S1, S2 present. Soft systolic murmur, no rub, S3 or S4. Abdomen: Soft, non-tender, non-distended. BS present x 4 quadrants. No hepatosplenomegaly.  Extremities: No clubbing, cyanosis or edema. DP/PT/Radials 2+ and equal bilaterally. Psych: Normal affect. Neuro: Alert and oriented X 3. Moves all extremities spontaneously. Musculoskeletal: No kyphosis. Skin: Intact. Warm and dry. No rashes or petechiae in exposed areas.   Labs  Basename 08/14/12 0549 08/14/12 0130 08/13/12 1900 08/13/12 1324  CKTOTAL -- -- 173 167  CKMB -- -- 5.3* 4.2*  TROPONINI <0.30 <0.30 <0.30 <0.30   Lab Results  Component Value Date   WBC 9.5 08/14/2012   HGB 13.3 08/14/2012   HCT 38.7* 08/14/2012   MCV 91.1 08/14/2012   PLT 219 08/14/2012    Lab 08/14/12 0130 08/13/12 1324  NA 136 --  K 3.9 --  CL 102 --  CO2 22 --  BUN 8 --  CREATININE 0.62 --  CALCIUM 7.9* --  PROT -- 6.6  BILITOT -- 0.4  ALKPHOS -- 59  ALT -- 18  AST -- 30  GLUCOSE 101* --   No components found with this basename: MAGNESIUM   Basename 08/13/12 1324  TSH 5.724*  T4TOTAL --  T3FREE --  THYROIDAB --    Basename 08/13/12 1324  INR 1.11    Radiology/Studies Dg Chest Port 1 View  08/13/2012  *RADIOLOGY REPORT*  Clinical Data: 56 year old male ST elevation myocardial infarction.  PORTABLE CHEST - 1 VIEW  Comparison: 1321 hours the same day.  Findings: Chronic abnormal mediastinal contour is stable.  No pneumothorax.  No pleural effusion.  No  pulmonary edema.  No definite consolidation.  Stable tracheal contour.  IMPRESSION: Stable portable appearance the chest with abnormal mediastinal contour presumably related to chronic lymphadenopathy first identified in 2010 by CT.  No superimposed acute pulmonary abnormality suspected.   Original Report Authenticated By: Harley Hallmark, M.D.    Dg Chest Port 1 View  08/13/2012  *RADIOLOGY REPORT*  Clinical Data: V-tach, ST-segment elevated MI  PORTABLE CHEST - 1 VIEW  Comparison: Chest CT - 12/31/2008  Findings:  Grossly unchanged cardiac silhouette with nodular widening of the mediastinum secondary to bulky mediastinal and right hilar adenopathy.  Lung volumes are reduced with perihilar heterogeneous opacities, likely atelectasis.  No focal airspace opacities.  No definite pleural effusion or pneumothorax.  No acute osseous abnormalities.  IMPRESSION: 1.  Grossly unchanged bulky mediastinal and right hilar lymphadenopathy demonstrated on remote chest CT. 2.  Decreased lung volumes without acute cardiopulmonary disease.   Original Report Authenticated By: Judene Companion.D.  12-lead ECG on presentation to [facility] ED shows monomorphic VT at 187 bpm Telemetry - NSR with ventricular pacing and NSVT.  Assessment and Plan 1a. Sustained, monomorphic VT - in setting of acute coronary ischemia 1b. Torsaides/Polymorphic VT 2. Acute inferior wall MI 3. Abnormal chest x-ray 4. H/o lung CA Rec: A difficult situation. In the short term, I would recommend continued IV lopressor, lidocain, Mg, and Amiodarone. Once/if she cools off from an arrhythmia perspective, would workup status of lung CA with PET scan. If no evidence of recurrent cancer, consider referral for CABG. 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 I will follow with you.  Lewayne Bunting, M.D.

## 2012-08-14 NOTE — Progress Notes (Signed)
Subjective:  No chest. Runs of symptomatic NSVT this am.He required shock X 2., Amiodarone bolus, Mg++ 2gm, Lidocaine and increased pacer rate. He continues to have VT/VF, code called.  Objective:  Vital Signs in the last 24 hours: Temp:  [97.4 F (36.3 C)-98.2 F (36.8 C)] 97.8 F (36.6 C) (09/23 0700) Pulse Rate:  [68-186] 80  (09/23 0700) Resp:  [15-37] 24  (09/23 0700) BP: (82-125)/(52-80) 99/78 mmHg (09/23 0700) SpO2:  [94 %-100 %] 98 % (09/23 0700) Weight:  [100 kg (220 lb 7.4 oz)-105 kg (231 lb 7.7 oz)] 105 kg (231 lb 7.7 oz) (09/22 1530)  Intake/Output from previous day:  Intake/Output Summary (Last 24 hours) at 08/14/12 0819 Last data filed at 08/14/12 0700  Gross per 24 hour  Intake 2179.42 ml  Output   2295 ml  Net -115.58 ml    Physical Exam: General appearance: alert, cooperative  no JVD Lungs: decreased breath sounds Heart: regular rate and rhythm Abd; BS+ No sig edema   Rate: 80   Rhythm: paced  Lab Results:  Basename 08/14/12 0530 08/13/12 1324  WBC 9.5 11.5*  HGB 13.3 14.8  PLT 219 217    Basename 08/14/12 0130 08/13/12 1324  NA 136 132*  K 3.9 4.3  CL 102 95*  CO2 22 24  GLUCOSE 101* 273*  BUN 8 14  CREATININE 0.62 0.82    Basename 08/14/12 0549 08/14/12 0130  TROPONINI <0.30 <0.30   Hepatic Function Panel  Basename 08/13/12 1324  PROT 6.6  ALBUMIN 3.5  AST 30  ALT 18  ALKPHOS 59  BILITOT 0.4  BILIDIR --  IBILI --   No results found for this basename: CHOL in the last 72 hours  Basename 08/13/12 1324  INR 1.11    Imaging: Imaging results have been reviewed  Cardiac Studies:  Assessment/Plan:   Principal Problem:  *STEMI (ST elevation myocardial infarction) Active Problems:  Sustained ventricular tachycardia with associated syncope  CAD (coronary artery disease) 3 vessel disease  Hx of cancer of lung, 3 years ago treated with radiation and chemo and resolved  Cardiomyopathy, ischemic, by cardiac cath 30-35%  08/13/12   S/P coronary artery stent placement, acutely to RCA for acute Post. MI with BMS, 08/13/12  Pacemaker, temporary placed 08/13/12  GERD (gastroesophageal reflux disease)  Hyperglycemia, pt has been on meds for diabetes in the past   Plan- Pt initially awake and alert, no distress, he then had recurrent VT/VF resulting in code blue. Dr [name] present.   Corine Shelter PA-C 08/14/2012, 8:19 AM   Patient seen and examined. Agree with assessment and plan. Pt with recurrent VT. VT yesterday monomorphic suggestive of scar substrate as well an different morphology, and ? Polymorphic secondary to ischemia Recurrent VT this am treated with additional 300 mg amiodorone bolus, IV Lopressor total of 10 mg and V pacing at 90, now v paced rhythm  without recurrence. 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

## 2012-08-14 NOTE — Progress Notes (Signed)
Chaplain Responded to Code Stemi, reported to ED to assist with any pastoral needs. Patient was taken to Cath Lab upon arrival to ED, Chaplain was able to get contact information for family which was call and informed of patients presence in hospital.  Medical staff was informed of family notification and travel from [place].  Greeted family upon arrival to hospital and escorted them to patients room, offered comfort measures as needed. Family was appreciative of all support.  On-Call Chaplain Janell Quiet [phone]

## 2012-08-15 ENCOUNTER — Inpatient Hospital Stay (HOSPITAL_COMMUNITY): Payer: BC Managed Care – PPO

## 2012-08-15 DIAGNOSIS — J9600 Acute respiratory failure, unspecified whether with hypoxia or hypercapnia: Secondary | ICD-10-CM | POA: Diagnosis not present

## 2012-08-15 DIAGNOSIS — I2589 Other forms of chronic ischemic heart disease: Secondary | ICD-10-CM

## 2012-08-15 DIAGNOSIS — J189 Pneumonia, unspecified organism: Secondary | ICD-10-CM | POA: Diagnosis present

## 2012-08-15 DIAGNOSIS — R579 Shock, unspecified: Secondary | ICD-10-CM

## 2012-08-15 LAB — GLUCOSE, CAPILLARY: Glucose-Capillary: 95 mg/dL (ref 70–99)

## 2012-08-15 LAB — BASIC METABOLIC PANEL
BUN: 11 mg/dL (ref 6–23)
CO2: 23 mEq/L (ref 19–32)
Calcium: 8.3 mg/dL — ABNORMAL LOW (ref 8.4–10.5)
Chloride: 97 mEq/L (ref 96–112)
Creatinine, Ser: 0.76 mg/dL (ref 0.50–1.35)
GFR calc Af Amer: >90 mL/min (ref >90)
GFR calc non Af Amer: >90 mL/min (ref >90)
Glucose, Bld: 149 mg/dL — ABNORMAL HIGH (ref 70–99)
Potassium: 4 mEq/L (ref 3.5–5.1)
Sodium: 133 mEq/L — ABNORMAL LOW (ref 135–145)

## 2012-08-15 LAB — LACTATE DEHYDROGENASE, PLEURAL OR PERITONEAL FLUID: LD, Fluid: 124 U/L — ABNORMAL HIGH (ref 3–23)

## 2012-08-15 LAB — CBC
Hemoglobin: 14.3 g/dL (ref 13.0–17.0)
MCH: 31.1 pg (ref 26.0–34.0)
RBC: 4.6 MIL/uL (ref 4.22–5.81)

## 2012-08-15 LAB — BODY FLUID CELL COUNT WITH DIFFERENTIAL
Eos, Fluid: 0 %
Neutrophil Count, Fluid: 3 % (ref 0–25)
Total Nucleated Cell Count, Fluid: 1452 cu mm — ABNORMAL HIGH (ref 0–1000)

## 2012-08-15 LAB — GLUCOSE, SEROUS FLUID: Glucose, Fluid: 153 mg/dL

## 2012-08-15 MED ORDER — SUCCINYLCHOLINE CHLORIDE 20 MG/ML IJ SOLN
INTRAMUSCULAR | Status: AC
  Filled 2012-08-15: qty 10

## 2012-08-15 MED ORDER — PIPERACILLIN-TAZOBACTAM 3.375 G IVPB
3.3750 g | Freq: Three times a day (TID) | INTRAVENOUS | Status: DC
  Administered 2012-08-15 – 2012-08-20 (×15): 3.375 g via INTRAVENOUS
  Filled 2012-08-15 (×17): qty 50

## 2012-08-15 MED ORDER — MIDAZOLAM HCL 2 MG/2ML IJ SOLN
INTRAMUSCULAR | Status: AC
  Administered 2012-08-15: 2 mg
  Filled 2012-08-15: qty 2

## 2012-08-15 MED ORDER — FUROSEMIDE 10 MG/ML IJ SOLN
40.0000 mg | Freq: Once | INTRAMUSCULAR | Status: AC
  Filled 2012-08-15: qty 4

## 2012-08-15 MED ORDER — BIOTENE DRY MOUTH MT LIQD
15.0000 mL | Freq: Two times a day (BID) | OROMUCOSAL | Status: DC
  Administered 2012-08-15: 15 mL via OROMUCOSAL

## 2012-08-15 MED ORDER — FENTANYL CITRATE 0.05 MG/ML IJ SOLN
INTRAMUSCULAR | Status: AC
  Filled 2012-08-15: qty 2

## 2012-08-15 MED ORDER — FENTANYL BOLUS VIA INFUSION
50.0000 ug | Freq: Four times a day (QID) | INTRAVENOUS | Status: DC | PRN
  Filled 2012-08-15: qty 100

## 2012-08-15 MED ORDER — ROCURONIUM BROMIDE 50 MG/5ML IV SOLN
INTRAVENOUS | Status: AC
  Administered 2012-08-15: 80 mg
  Filled 2012-08-15: qty 2

## 2012-08-15 MED ORDER — LIDOCAINE HCL (CARDIAC) 20 MG/ML IV SOLN
INTRAVENOUS | Status: AC
  Filled 2012-08-15: qty 5

## 2012-08-15 MED ORDER — CHLORHEXIDINE GLUCONATE 0.12 % MT SOLN
OROMUCOSAL | Status: AC
  Filled 2012-08-15: qty 15

## 2012-08-15 MED ORDER — PANTOPRAZOLE SODIUM 40 MG IV SOLR
40.0000 mg | INTRAVENOUS | Status: DC
  Administered 2012-08-15: 40 mg via INTRAVENOUS
  Filled 2012-08-15 (×2): qty 40

## 2012-08-15 MED ORDER — VANCOMYCIN HCL 1000 MG IV SOLR
1750.0000 mg | Freq: Once | INTRAVENOUS | Status: AC
  Administered 2012-08-15: 1750 mg via INTRAVENOUS
  Filled 2012-08-15: qty 1750

## 2012-08-15 MED ORDER — ATROPINE SULFATE 1 MG/ML IJ SOLN
INTRAMUSCULAR | Status: AC
  Filled 2012-08-15: qty 1

## 2012-08-15 MED ORDER — BIOTENE DRY MOUTH MT LIQD
15.0000 mL | Freq: Four times a day (QID) | OROMUCOSAL | Status: DC
  Administered 2012-08-15 – 2012-08-20 (×18): 15 mL via OROMUCOSAL

## 2012-08-15 MED ORDER — ETOMIDATE 2 MG/ML IV SOLN
INTRAVENOUS | Status: AC
  Administered 2012-08-15: 20 mg
  Filled 2012-08-15: qty 20

## 2012-08-15 MED ORDER — CHLORHEXIDINE GLUCONATE 0.12 % MT SOLN
15.0000 mL | Freq: Two times a day (BID) | OROMUCOSAL | Status: DC
  Administered 2012-08-15 – 2012-08-19 (×9): 15 mL via OROMUCOSAL
  Filled 2012-08-15 (×8): qty 15

## 2012-08-15 MED ORDER — VANCOMYCIN HCL IN DEXTROSE 1-5 GM/200ML-% IV SOLN
1000.0000 mg | Freq: Two times a day (BID) | INTRAVENOUS | Status: DC
  Administered 2012-08-16 – 2012-08-17 (×4): 1000 mg via INTRAVENOUS
  Filled 2012-08-15 (×4): qty 200

## 2012-08-15 MED ORDER — SODIUM CHLORIDE 0.9 % IV SOLN
50.0000 ug/h | INTRAVENOUS | Status: DC
  Administered 2012-08-15 – 2012-08-17 (×2): 50 ug/h via INTRAVENOUS
  Filled 2012-08-15 (×2): qty 50

## 2012-08-15 MED ORDER — INSULIN ASPART 100 UNIT/ML ~~LOC~~ SOLN
2.0000 [IU] | SUBCUTANEOUS | Status: DC
  Administered 2012-08-15 – 2012-08-17 (×5): 2 [IU] via SUBCUTANEOUS
  Administered 2012-08-17: 6 [IU] via SUBCUTANEOUS
  Administered 2012-08-17: 2 [IU] via SUBCUTANEOUS

## 2012-08-15 MED ORDER — FENTANYL CITRATE 0.05 MG/ML IJ SOLN
INTRAMUSCULAR | Status: AC
  Administered 2012-08-15: 100 ug
  Filled 2012-08-15: qty 2

## 2012-08-15 MED ORDER — SODIUM CHLORIDE 0.9 % IV SOLN
2.0000 mg/h | INTRAVENOUS | Status: DC
  Administered 2012-08-15 – 2012-08-16 (×2): 2 mg/h via INTRAVENOUS
  Administered 2012-08-17 (×2): 3 mg/h via INTRAVENOUS
  Filled 2012-08-15 (×6): qty 10

## 2012-08-15 MED ORDER — NOREPINEPHRINE BITARTRATE 1 MG/ML IJ SOLN
2.0000 ug/min | INTRAVENOUS | Status: DC
  Administered 2012-08-15: 10 ug/min via INTRAVENOUS
  Administered 2012-08-16: 2 ug/min via INTRAVENOUS
  Administered 2012-08-17: 7 ug/min via INTRAVENOUS
  Administered 2012-08-18: 3 ug/min via INTRAVENOUS
  Filled 2012-08-15 (×4): qty 4

## 2012-08-15 MED ORDER — MIDAZOLAM BOLUS VIA INFUSION
1.0000 mg | INTRAVENOUS | Status: DC | PRN
  Filled 2012-08-15: qty 2

## 2012-08-15 NOTE — Progress Notes (Signed)
[facility]  Subjective: Still with some chest discomfort.  Some SOB last night.  Objective: Vital signs in last 24 hours: Temp:  [97.7 F (36.5 C)-98.6 F (37 C)] 98.3 F (36.8 C) (09/24 0400) Pulse Rate:  [71-91] 89  (09/24 0700) Resp:  [16-40] 31  (09/24 0700) BP: (85-125)/(62-73) 96/69 mmHg (09/24 0600) SpO2:  [92 %-100 %] 95 % (09/24 0700) FiO2 (%):  [100 %] 100 % (09/23 0915) Last BM Date: 08/13/12  Intake/Output from previous day: 09/23 0701 - 09/24 0700 In: 3482.1 [P.O.:360; I.V.:3092.1; IV Piggyback:30] Out: 2100 [Urine:1600; Emesis/NG output:500] Intake/Output this shift:    Medications Current Facility-Administered Medications  Medication Dose Route Frequency Provider Last Rate Last Dose  . 0.9 %  sodium chloride infusion   Intravenous Continuous Eda Paschal [place], [place] 50 mL/hr at 08/14/12 1519 50 mL/hr at 08/14/12 1519  . acetaminophen (TYLENOL) tablet 650 mg  650 mg Oral Q4H PRN Lennette Bihari, MD      . ALPRAZolam Prudy Feeler) tablet 0.25 mg  0.25 mg Oral TID PRN Nada Boozer, NP      . amiodarone (NEXTERONE PREMIX) 360 mg/200 mL dextrose IV infusion  60 mg/hr Intravenous Continuous Abelino Derrick, PA 33.3 mL/hr at 08/15/12 0318 60 mg/hr at 08/15/12 0318  . amiodarone (NEXTERONE) 1.8 mg/mL load via infusion 150 mg  150 mg Intravenous Once Lennette Bihari, MD 500.1 mL/hr at 08/14/12 0837 150 mg at 08/14/12 0837  . amiodarone (NEXTERONE) IV bolus only 150 mg/100 mL  150 mg Intravenous Once Abelino Derrick, [place]      . amiodarone (NEXTERONE) IV bolus only 150 mg/100 mL  150 mg Intravenous Once Abelino Derrick, PA   150 mg at 08/14/12 0850  . antiseptic oral rinse (BIOTENE) solution 15 mL  15 mL Mouth Rinse BID Lennette Bihari, MD      . aspirin EC tablet 81 mg  81 mg Oral Daily Lennette Bihari, MD   81 mg at 08/14/12 0906  . furosemide (LASIX) 10 MG/ML injection           . furosemide (LASIX) injection 40 mg  40 mg Intravenous Once [employer], [place]   40  mg at 08/14/12 1522  . heparin ADULT infusion 100 units/mL (25000 units/250 mL)  1,450 Units/hr Intravenous Continuous Benny Lennert, PHARMD 14.5 mL/hr at 08/15/12 0320 1,450 Units/hr at 08/15/12 0320  . insulin aspart (novoLOG) injection 0-9 Units  0-9 Units Subcutaneous TID WC Nada Boozer, NP   2 Units at 08/14/12 1745  . levalbuterol (XOPENEX) nebulizer solution 0.63 mg  0.63 mg Nebulization Q6H PRN Abelino Derrick, PA   0.63 mg at 08/14/12 1607  . lidocaine (cardiac) IV  infusion 4 mg/mL  1-4 mg/min Intravenous Continuous Lennette Bihari, MD 30 mL/hr at 08/14/12 2337 2 mg/min at 08/14/12 2337  . lidocaine (XYLOCAINE) 4 mg/mL bolus via infusion 100 mg  100 mg Intravenous Once Abelino Derrick, PA   100 mg at 08/14/12 0826  . lidocaine (XYLOCAINE) 4 mg/mL bolus via infusion 100 mg  100 mg Intravenous Once Abelino Derrick, PA   100 mg at 08/14/12 1519  . magnesium sulfate IVPB 2 g 50 mL  2 g Intravenous Once Abelino Derrick, [place]   2 g at 08/14/12 0904  . metoprolol (LOPRESSOR) 1 MG/ML injection        5 mg at 08/14/12 0810  . metoprolol (LOPRESSOR) injection 5 mg  5  mg Intravenous Once Abelino Derrick, [place]   5 mg at 08/14/12 0839  . metoprolol tartrate (LOPRESSOR) tablet 12.5 mg  12.5 mg Oral Q6H Eda Paschal [place], [place]   12.5 mg at 08/15/12 0616  . morphine 2 MG/ML injection 2 mg  2 mg Intravenous Q3H PRN Lennette Bihari, MD      . ondansetron [facility]) injection 4 mg  4 mg Intravenous Q4H PRN Chrystie Nose, MD   4 mg at 08/15/12 0207  . promethazine (PHENERGAN) injection 12.5 mg  12.5 mg Intravenous Q4H PRN Chrystie Nose, MD   12.5 mg at 08/14/12 2020  . ramipril (ALTACE) capsule 1.25 mg  1.25 mg Oral Daily Eda Paschal [place], PA   1.25 mg at 08/14/12 1147  . Ticagrelor (BRILINTA) tablet 90 mg  90 mg Oral BID Lennette Bihari, MD   90 mg at 08/14/12 2242  . zolpidem (AMBIEN) tablet 10 mg  10 mg Oral QHS PRN Abelino Derrick, PA   10 mg at 08/13/12 2137  . DISCONTD: amiodarone (NEXTERONE) IV bolus only 150 mg/100 mL   150 mg Intravenous Once Abelino Derrick, PA   150 mg at 08/14/12 0805  . DISCONTD: lidocaine (cardiac) IV  infusion 4 mg/mL  2 mg/min Intravenous Continuous Eda Paschal [place], [place] 30 mL/hr at 08/14/12 0836 2 mg/min at 08/14/12 0836  . DISCONTD: lidocaine (cardiac) IV  infusion 4 mg/mL  1-4 mg/min Intravenous Titrated Lennette Bihari, MD      . DISCONTD: metoprolol tartrate (LOPRESSOR) tablet 12.5 mg  12.5 mg Oral BID Lennette Bihari, MD   12.5 mg at 08/13/12 2136  . DISCONTD: ondansetron (ZOFRAN) injection 4 mg  4 mg Intravenous Q6H PRN Lennette Bihari, MD   4 mg at 08/14/12 1928  . DISCONTD: procainamide (PRONESTYL) 500 mg in dextrose 5 % 50 mL IVPB  500 mg Intravenous Once Lennette Bihari, MD        PE: General appearance: alert, cooperative and no distress Lungs: Bilateral rhonchi Heart: regular rate and rhythm Abdomen: +BS Extremities: No LEE Pulses: 2+ and symmetric 1+ right DP, 2+ left DP.  Right foot cooler than left. Neurologic: Grossly normal  Lab Results:   Basename 08/15/12 0445 08/14/12 0530 08/13/12 1324  WBC 17.4* 9.5 11.5*  HGB 14.3 13.3 14.8  HCT 41.7 38.7* 41.8  PLT 223 219 217   BMET  Basename 08/15/12 0445 08/14/12 0130 08/13/12 1324  NA 133* 136 132*  K 4.0 3.9 4.3  CL 97 102 95*  CO2 23 22 24   GLUCOSE 149* 101* 273*  BUN 11 8 14   CREATININE 0.76 0.62 0.82  CALCIUM 8.3* 7.9* 8.8   PT/INR  Basename 08/13/12 1324  LABPROT 14.2  INR 1.11   Cardiac Panel (last 3 results)  Basename 08/14/12 0549 08/14/12 0130 08/13/12 1900 08/13/12 1324  CKTOTAL -- -- 173 167  CKMB -- -- 5.3* 4.2*  TROPONINI <0.30 <0.30 <0.30 --  RELINDX -- -- 3.1* 2.5   Echo, Study Conclusions  - Left ventricle: The cavity size was normal. Wall thickness was normal. Systolic function was severely reduced. The estimated ejection fraction was in the range of 25% to 30%. There is severe hypokinesis of the anterior, anteroseptal and apical walls with a "hinge-point" suggestive of LAD  territory ischemia/infarct. There ismoderate inferior hypokinesis and mild hypokinesis of the lateral wall. The study is not technically sufficient to allow evaluation of LV diastolic function. - Mitral valve: Calcified annulus. Trivial  regurgitation. - Left atrium: The atrium was normal in size. - Right ventricle: Pacer wire or catheter noted in right ventricle. - Right atrium: Pacer wire or catheter noted in right atrium. - Systemic veins: The IVC measures <2.1 cm but does not collapse >50%, suggesting an elevated RA pressure of 10 mmHg. A pacer wire is seen in the IVC.    Assessment/Plan  Principal Problem:  *STEMI (ST elevation myocardial infarction) Active Problems:  Sustained ventricular tachycardia with associated syncope  Hx of cancer of lung, 3 years ago treated with radiation and chemo and resolved  GERD (gastroesophageal reflux disease)  Hyperglycemia, pt has been on meds for diabetes in the past  Cardiomyopathy, ischemic, by cardiac cath 30-35% 08/13/12   CAD (coronary artery disease) 3 vessel disease  S/P coronary artery stent placement, acutely to RCA for acute Post. MI with BMS, 08/13/12  Pacemaker, temporary placed 08/13/12  Lung mass  Hypoxemia  Plan:  S/p STEMI and PCI to RCA with BM Vision stent.  NSVT 9/23 requiring shock times 2.  No further NSVT. Amiodarone/lidocaine.  EF 25-30% by echo.  CT Chest with CM pending.   ? AICD/CABG.  WBCs increased.  Afebrile   LOS: 2 days    [name] [name] 08/15/2012 7:44 AM

## 2012-08-15 NOTE — Progress Notes (Signed)
Name: [name] [name] MRN: [id number] DOB: 10-18-1956    LOS: 2  Referring Provider:  [name] (cards) Reason for Referral:  Recurrent VT, hx small cell lung ca  PULMONARY / CRITICAL CARE MEDICINE  HPI:  56yo male with hx CAD, small cell lung ca (rx 3 years ago with chemo and xrt) with ??residual R hilar mass, ischemic cardiomyopathy with EF 30% presented 9/22 with syncope and sustained VTach and ECG suggestive of acute MI.  He was admitted by cardiology and given IV amiodarone and taken emergently to cath lab where he had PTCA/stent and pacemaker placement. On am 9/23 he developed further recurrent VT with shock x 2, amiodarone, lidocaine, Mg+ and PCCM asked to consult. Pt did not require intubation during ACLS.   Vital Signs: Temp:  [97.7 F (36.5 C)-98.7 F (37.1 C)] 98.7 F (37.1 C) (09/24 0754) Pulse Rate:  [71-94] 94  (09/24 0800) Resp:  [16-40] 32  (09/24 0800) BP: (85-125)/(62-73) 113/69 mmHg (09/24 0800) SpO2:  [92 %-99 %] 93 % (09/24 0800)  Physical Examination: General:  wdwn male, NAD  Neuro:  Awake, alert, appropriate HEENT:  Mm dry, no JVD  Cardiovascular:  s1s2 V paced  Lungs:  resps even non labored on , CTA Abdomen:  Soft, +bs Ext: warm and dry, no edema   Principal Problem:  *STEMI (ST elevation myocardial infarction) Active Problems:  Sustained ventricular tachycardia with associated syncope  Hx of cancer of lung, 3 years ago treated with radiation and chemo and resolved  GERD (gastroesophageal reflux disease)  Hyperglycemia, pt has been on meds for diabetes in the past  Cardiomyopathy, ischemic, by cardiac cath 30-35% 08/13/12   CAD (coronary artery disease) 3 vessel disease  S/P coronary artery stent placement, acutely to RCA for acute Post. MI with BMS, 08/13/12  Pacemaker, temporary placed 08/13/12  Lung mass  Hypoxemia  BMET    Component Value Date/Time   NA 133* 08/15/2012 0445   K 4.0 08/15/2012 0445   CL 97 08/15/2012 0445   CO2 23 08/15/2012 0445     GLUCOSE 149* 08/15/2012 0445   BUN 11 08/15/2012 0445   CREATININE 0.76 08/15/2012 0445   CALCIUM 8.3* 08/15/2012 0445   GFRNONAA >90 08/15/2012 0445   GFRAA >90 08/15/2012 0445   CBC    Component Value Date/Time   WBC 17.4* 08/15/2012 0445   RBC 4.60 08/15/2012 0445   HGB 14.3 08/15/2012 0445   HCT 41.7 08/15/2012 0445   PLT 223 08/15/2012 0445   MCV 90.7 08/15/2012 0445   MCH 31.1 08/15/2012 0445   MCHC 34.3 08/15/2012 0445   RDW 13.8 08/15/2012 0445   LYMPHSABS 1.5 08/13/2012 1324   MONOABS 0.7 08/13/2012 1324   EOSABS 0.1 08/13/2012 1324   BASOSABS 0.1 08/13/2012 1324   ASSESSMENT AND PLAN  Recurrent VT - s/p pacemaker, stenting in cath lab Acute MI Hx lung ca - small cell -- s/p chemo, xrt with ?residual R hilar mass. No recent imaging. ?? Enlarging mass contributing to VT specially is respiratory distress is an issue.  PLAN -  CT chest with contrast -- will f/u after CT chest.  ??residual/enlarging mass v radiation pneumonitis. If this is indeed recurrence of cancer will likely need palliative radiation.  More concerning right now however is the increased respiratory effort with positive fluid status. O2 as needed  VT management per cards  Will give a dose of lasix x1 now. F/U labs. IS per rt protocol.  Alyson Reedy, M.D. [facility]  Pulmonary/Critical Care Medicine. Pager: [phone]. After hours pager: [phone].

## 2012-08-15 NOTE — Progress Notes (Signed)
Pt. Seen and examined. Agree with the NP/PA-C note as written.  Stable rhythm overnight on amiodarone and lidocaine. Will need to think about weaning lidocaine today or tomorrow due to possibility of toxicity with extended use. White blood cell count is elevated today, ?if lung mass represents infection.  Plan for chest CT today. Appreciate PCCM input.  Will need to address concerns of recurrent cancer before plans for surgery, ICD, etc .. Can be made.  Chrystie Nose, MD, [facility] Attending Cardiologist [facility]

## 2012-08-15 NOTE — Procedures (Signed)
Intubation Procedure Note [name] [name] [id number] Aug 26, 1956  Procedure: Intubation Indications: Respiratory insufficiency  Procedure Details Consent: Risks of procedure as well as the alternatives and risks of each were explained to the (patient/caregiver).  Consent for procedure obtained. Time Out: Verified patient identification, verified procedure, site/side was marked, verified correct patient position, special equipment/implants available, medications/allergies/relevent history reviewed, required imaging and test results available.  Performed  Maximum sterile technique was used including antiseptics, cap, gloves, gown, hand hygiene and mask.  Miller    Evaluation Hemodynamic Status: BP stable throughout; O2 sats: stable throughout and transiently fell during during procedure Patient's Current Condition: stable Complications: No apparent complications Patient did tolerate procedure well. Chest X-ray ordered to verify placement.  CXR: pending.  [name] [name] 08/15/2012

## 2012-08-15 NOTE — Procedures (Signed)
Central Venous Catheter Insertion Procedure Note [name] [name] [id number] 1956/11/02  Procedure: Insertion of Central Venous Catheter Indications: Assessment of intravascular volume, Drug and/or fluid administration and Frequent blood sampling  Procedure Details Consent: Risks of procedure as well as the alternatives and risks of each were explained to the (patient/caregiver).  Consent for procedure obtained. Time Out: Verified patient identification, verified procedure, site/side was marked, verified correct patient position, special equipment/implants available, medications/allergies/relevent history reviewed, required imaging and test results available.  Performed  Maximum sterile technique was used including antiseptics, cap, gloves, gown, hand hygiene, mask and sheet. Skin prep: Chlorhexidine; local anesthetic administered A antimicrobial bonded/coated triple lumen catheter was placed in the right internal jugular vein using the Seldinger technique.  Evaluation Blood flow good Complications: No apparent complications Patient did tolerate procedure well. Chest X-ray ordered to verify placement.  CXR: pending.  U/S used in placement, picture in chart.  YACOUB,WESAM 08/15/2012, 11:12 AM

## 2012-08-15 NOTE — Procedures (Signed)
Thoracentesis  Right sided thoracentesis due to right sided pleural effusion.  Consent from patient prior to intubation and wife.  Patient sedated, right lateral chest cleaned, visualized with U/S, lidocaine injected, skin incision placed, catheter over needle, catheter placed and 40 cc of purulent fluid removed then procedure stopped and catheter removed inorder to place chest tube.  CXR pending.  Alyson Reedy, M.D. [facility] Pulmonary/Critical Care Medicine. Pager: [phone]. After hours pager: [phone].

## 2012-08-15 NOTE — Progress Notes (Signed)
Reviewed notes. Will hold ambulation today unless o/w indicated. Ethelda Chick CES, ACSM

## 2012-08-15 NOTE — Progress Notes (Signed)
eLink Physician-Brief Progress Note Patient Name: [name] [name] DOB: Dec 16, 1955 MRN: [id number]  Date of Service  08/15/2012   HPI/Events of Note   ppi needed, vent  eICU Interventions  Add it   Intervention Category Intermediate Interventions: Best-practice therapies (e.g. DVT, beta blocker, etc.)  Nelda Bucks. 08/15/2012, 3:15 PM

## 2012-08-15 NOTE — Progress Notes (Signed)
eLink Physician-Brief Progress Note Patient Name: [name] [name] DOB: [birth year] MRN: [id number]  Date of Service  08/15/2012   HPI/Events of Note   Some blood from CT  eICU Interventions  Hold heparin   Intervention Category Major Interventions: Hemorrhage - evaluation and management  Nelda Bucks. 08/15/2012, 5:11 PM

## 2012-08-15 NOTE — Progress Notes (Signed)
MD notified of patient's 4-5 beat runs of Vtach and frequent PVC's.  Will wean Lidocaine off per MD order.  Will monitor patient closely.

## 2012-08-15 NOTE — Procedures (Signed)
Chest Tube Insertion Procedure Note  Indications:  Clinically significant Empyema  Pre-operative Diagnosis: Empyema  Post-operative Diagnosis: Empyema  Procedure Details  Informed consent was obtained for the procedure, including sedation.  Risks of lung perforation, hemorrhage, arrhythmia, and adverse drug reaction were discussed.   After sterile skin prep, using standard technique, a 32 French tube was placed in the right anterior 6 rib space.  Findings: 1000 ml of purulent fluid obtained  Estimated Blood Loss:  Minimal         Specimens:  Sent purulent fluid              Complications:  None; patient tolerated the procedure well.         Disposition: ICU - intubated and critically ill.         Condition: stable  Attending Attestation: I was present and scrubbed for the entire procedure.  Alyson Reedy, M.D. [facility] Pulmonary/Critical Care Medicine. Pager: [phone]. After hours pager: [phone].

## 2012-08-15 NOTE — Progress Notes (Signed)
Saw patient's CT with large right sided pleural effusion and diffuse infiltrate as well as question of bronchus intermedius obstructive lesion.  After discussion with the patient then after that the wife over the phone, decision was made to go ahead and intubate the patient, place TLC, perform a thora, pan culture, broad spectrum abx and likely a bronch post intubation.  Heparin was held for now.  Consents all from patient while awake.  Total CC time 90 min.  Alyson Reedy, M.D. [facility] Pulmonary/Critical Care Medicine. Pager: [phone]. After hours pager: [phone].

## 2012-08-15 NOTE — Progress Notes (Signed)
  ANTICOAGULATION CONSULT NOTE - Follow Up Consult  Pharmacy Consult for Heparin Indication: chest pain/ACS and SVT  No Known Allergies  Patient Measurements: Height: 6' (182.9 cm) Weight: 231 lb 7.7 oz (105 kg) IBW/kg (Calculated) : 77.6  Heparin Dosing Weight: 99.4 kg  Vital Signs: Temp: 98.7 F (37.1 C) (09/24 0754) Temp src: Oral (09/24 0754) BP: 113/69 mmHg (09/24 0800) Pulse Rate: 94  (09/24 0800)  Labs:  Basename 08/15/12 0445 08/14/12 1800 08/14/12 0549 08/14/12 0530 08/14/12 0130 08/13/12 1900 08/13/12 1324  HGB 14.3 -- -- 13.3 -- -- --  HCT 41.7 -- -- 38.7* -- -- 41.8  PLT 223 -- -- 219 -- -- 217  APTT -- -- -- -- -- -- --  LABPROT -- -- -- -- -- -- 14.2  INR -- -- -- -- -- -- 1.11  HEPARINUNFRC 0.42 0.37 -- -- -- -- --  CREATININE 0.76 -- -- -- 0.62 -- 0.82  CKTOTAL -- -- -- -- -- 173 167  CKMB -- -- -- -- -- 5.3* 4.2*  TROPONINI -- -- <0.30 -- <0.30 <0.30 --    Estimated Creatinine Clearance: 132.3 ml/min (by C-G formula based on Cr of 0.76).   Medications:  Scheduled:    . amiodarone  150 mg Intravenous Once  . antiseptic oral rinse  15 mL Mouth Rinse BID  . aspirin EC  81 mg Oral Daily  . atropine      . furosemide      . furosemide  40 mg Intravenous Once  . insulin aspart  0-9 Units Subcutaneous TID WC  . lidocaine  100 mg Intravenous Once  . magnesium sulfate 1 - 4 g bolus IVPB  2 g Intravenous Once  . metoprolol tartrate  12.5 mg Oral Q6H  . ramipril  1.25 mg Oral Daily  . Ticagrelor  90 mg Oral BID    Assessment 56 yo male admitted for STEMI with SVT and s/p PTCA/stent (9/22). Today's am HL therapeutic at 0.42 on 1450 unit/hour; last night's HL also therapeutic (0.37) on current rate with no bleeding noted; H/H and plt WNL.  Goal of Therapy:  Heparin level 0.3-0.7 units/ml Monitor platelets by anticoagulation protocol: Yes   Plan:  -Continue heparin infusion at 1450 units/hr -Daily HL, CBC, and monitor s/sx of bleeding  Tiney Rouge, PharmD Candidate 08/15/2012,9:13 AM  I have reviewed the plan above and agree with the recommendations.  Harland German, Pharm D 08/15/2012 9:52 AM

## 2012-08-15 NOTE — Progress Notes (Signed)
ANTIBIOTIC CONSULT NOTE - INITIAL  Pharmacy Consult for Vancomycin/Zosyn Indication: rule out pneumonia  No Known Allergies  Patient Measurements: Height: 6' (182.9 cm) Weight: 231 lb 7.7 oz (105 kg) IBW/kg (Calculated) : 77.6  Adjusted Body Weight: 88.6 kg  Vital Signs: Temp: 98.7 F (37.1 C) (09/24 0754) Temp src: Oral (09/24 0754) BP: 113/69 mmHg (09/24 0800) Pulse Rate: 94  (09/24 0800) Intake/Output from previous day: 09/23 0701 - 09/24 0700 In: 3609.9 [P.O.:360; I.V.:3219.9; IV Piggyback:30] Out: 2100 [Urine:1600; Emesis/NG output:500] Intake/Output from this shift: Total I/O In: 503.4 [P.O.:120; I.V.:383.4] Out: 200 [Urine:200]  Labs:  Basename 08/15/12 0445 08/14/12 0530 08/14/12 0130 08/13/12 1324  WBC 17.4* 9.5 -- 11.5*  HGB 14.3 13.3 -- 14.8  PLT 223 219 -- 217  LABCREA -- -- -- --  CREATININE 0.76 -- 0.62 0.82   Estimated Creatinine Clearance: 132.3 ml/min (by C-G formula based on Cr of 0.76). No results found for this basename: VANCOTROUGH:2,VANCOPEAK:2,VANCORANDOM:2,GENTTROUGH:2,GENTPEAK:2,GENTRANDOM:2,TOBRATROUGH:2,TOBRAPEAK:2,TOBRARND:2,AMIKACINPEAK:2,AMIKACINTROU:2,AMIKACIN:2, in the last 72 hours   Microbiology: Recent Results (from the past 720 hour(s))  MRSA PCR SCREENING     Status: Normal   Collection Time   08/13/12  3:41 PM      Component Value Range Status Comment   MRSA by PCR NEGATIVE  NEGATIVE Final     Medical History: Past Medical History  Diagnosis Date  . Cancer     lung  . STEMI (ST elevation myocardial infarction) 08/13/2012  . Sustained ventricular tachycardia with associated syncope 08/13/2012  . Hx of cancer of lung, 3 years ago treated with radiation and chemo and resolved 08/13/2012  . GERD (gastroesophageal reflux disease) 08/13/2012  . Hyperglycemia 08/13/2012  . Cardiomyopathy, ischemic, by cardiac cath 30-35% 08/13/12  08/13/2012  . CAD (coronary artery disease) 3 vessel disease 08/13/2012  . S/P coronary artery stent  placement, acutely to RCA for acute Post. MI with BMS, 08/13/12 08/13/2012    Medications:  Prescriptions prior to admission  Medication Sig Dispense Refill  . ibuprofen (ADVIL,MOTRIN) 200 MG tablet Take 600 mg by mouth every 6 (six) hours as needed. For pain      . omeprazole (PRILOSEC) 20 MG capsule Take 20 mg by mouth daily as needed. For heartburn       Assessment: 56 yo male in respiratory distress s/p STEMI w/ recurrent SVT--now NSR. Patient intubated today (9/24) with WBC elevated at 17.4 and afebrile. PMH history significant for small-cell lung cancer with no recent imaging. Rx consulted for empiric antibiotic management of pneumonia rule out. Plans for chest CT today. Scr WNL and stable.    Goal of Therapy:  Vancomycin trough level 15-20 mcg/ml  Plan:  -Give vancomycin loading dose of 1750 mg IV X1  -Vancomycin 1000 mg IV Q 12 H  -Zosyn 3.375g IV Q 8 H -Monitor renal function  Tiney Rouge PharmD Candidate 08/15/2012,11:13 AM  Harland German, Pharm D 08/15/2012 12:58 PM

## 2012-08-16 ENCOUNTER — Inpatient Hospital Stay (HOSPITAL_COMMUNITY): Payer: BC Managed Care – PPO

## 2012-08-16 DIAGNOSIS — J869 Pyothorax without fistula: Secondary | ICD-10-CM | POA: Diagnosis not present

## 2012-08-16 LAB — URINE CULTURE

## 2012-08-16 LAB — BLOOD GAS, ARTERIAL
MECHVT: 620 mL
PEEP: 10 cmH2O
TCO2: 25.4 mmol/L (ref 0–100)
pCO2 arterial: 31.3 mmHg — ABNORMAL LOW (ref 35.0–45.0)
pH, Arterial: 7.507 — ABNORMAL HIGH (ref 7.350–7.450)

## 2012-08-16 LAB — CBC
HCT: 36.2 % — ABNORMAL LOW (ref 39.0–52.0)
MCH: 31.6 pg (ref 26.0–34.0)
MCHC: 35.1 g/dL (ref 30.0–36.0)
MCHC: 34.3 g/dL (ref 30.0–36.0)
MCV: 90.3 fL (ref 78.0–100.0)
Platelets: 142 10*3/uL — ABNORMAL LOW (ref 150–400)
Platelets: 138 10*3/uL — ABNORMAL LOW (ref 150–400)
RDW: 13.7 % (ref 11.5–15.5)
WBC: 12.5 10*3/uL — ABNORMAL HIGH (ref 4.0–10.5)

## 2012-08-16 LAB — BASIC METABOLIC PANEL
Calcium: 8 mg/dL — ABNORMAL LOW (ref 8.4–10.5)
GFR calc non Af Amer: >90 mL/min (ref >90)
Sodium: 130 mEq/L — ABNORMAL LOW (ref 135–145)

## 2012-08-16 LAB — LIPID PANEL
Cholesterol: 130 mg/dL (ref 0–200)
LDL Cholesterol: 74 mg/dL (ref 0–99)
VLDL: 21 mg/dL (ref 0–40)

## 2012-08-16 LAB — GLUCOSE, CAPILLARY
Glucose-Capillary: 116 mg/dL — ABNORMAL HIGH (ref 70–99)
Glucose-Capillary: 110 mg/dL — ABNORMAL HIGH (ref 70–99)

## 2012-08-16 LAB — POCT I-STAT, CHEM 8
BUN: 14 mg/dL (ref 6–23)
Calcium, Ion: 1.06 mmol/L — ABNORMAL LOW (ref 1.12–1.23)
Chloride: 99 mEq/L (ref 96–112)
Creatinine, Ser: 0.9 mg/dL (ref 0.50–1.35)
Glucose, Bld: 263 mg/dL — ABNORMAL HIGH (ref 70–99)
TCO2: 24 mmol/L (ref 0–100)

## 2012-08-16 LAB — CHOLESTEROL, BODY FLUID: Cholesterol, Fluid: 62 mg/dL

## 2012-08-16 LAB — PHOSPHORUS: Phosphorus: 1.6 mg/dL — ABNORMAL LOW (ref 2.3–4.6)

## 2012-08-16 LAB — MAGNESIUM: Magnesium: 1.9 mg/dL (ref 1.5–2.5)

## 2012-08-16 MED ORDER — OSMOLITE 1.2 CAL PO LIQD
1000.0000 mL | ORAL | Status: DC
  Administered 2012-08-16 – 2012-08-17 (×2): 1000 mL
  Filled 2012-08-16 (×6): qty 1000

## 2012-08-16 MED ORDER — PRO-STAT SUGAR FREE PO LIQD
60.0000 mL | Freq: Four times a day (QID) | ORAL | Status: DC
  Administered 2012-08-16 – 2012-08-17 (×7): 60 mL
  Filled 2012-08-16 (×12): qty 60

## 2012-08-16 MED ORDER — LIDOCAINE IN D5W 4-5 MG/ML-% IV SOLN
1.0000 mg/min | INTRAVENOUS | Status: DC
  Administered 2012-08-16: 1 mg/min via INTRAVENOUS
  Filled 2012-08-16: qty 250
  Filled 2012-08-16 (×2): qty 500

## 2012-08-16 MED ORDER — SODIUM CHLORIDE 0.9 % IV SOLN
INTRAVENOUS | Status: DC
  Administered 2012-08-19: 13:00:00 via INTRAVENOUS

## 2012-08-16 MED ORDER — POTASSIUM PHOSPHATE DIBASIC 3 MMOLE/ML IV SOLN
30.0000 mmol | Freq: Once | INTRAVENOUS | Status: AC
  Administered 2012-08-16: 30 mmol via INTRAVENOUS
  Filled 2012-08-16: qty 10

## 2012-08-16 MED ORDER — PANTOPRAZOLE SODIUM 40 MG PO PACK
40.0000 mg | PACK | ORAL | Status: DC
  Administered 2012-08-16 – 2012-08-19 (×4): 40 mg
  Filled 2012-08-16 (×4): qty 20

## 2012-08-16 MED ORDER — ADULT MULTIVITAMIN LIQUID CH
5.0000 mL | Freq: Every day | ORAL | Status: DC
  Administered 2012-08-16 – 2012-08-19 (×4): 5 mL
  Filled 2012-08-16 (×5): qty 5

## 2012-08-16 NOTE — Progress Notes (Signed)
eLink Physician-Brief Progress Note Patient Name: ROODY THEBO DOB: 1956/02/14 MRN: [id number]  Date of Service  08/16/2012   HPI/Events of Note   resp alkalosis  eICU Interventions  Drop RR to 15   Intervention Category Major Interventions: Acid-Base disturbance - evaluation and management  Jillayne Witte V. 08/16/2012, 4:35 AM

## 2012-08-16 NOTE — Progress Notes (Signed)
Patient ID: [name] [name], male   DOB: [birth year], 56 y.o.   MRN: [id number] Subjective:  He remains awake but intubated  Objective:  Vital Signs in the last 24 hours: Temp:  [98.8 F (37.1 C)-100 F (37.8 C)] 99.7 F (37.6 C) (09/25 1100) Pulse Rate:  [53-122] 122  (09/25 1200) Resp:  [13-41] 14  (09/25 1200) BP: (77-100)/(50-77) 89/51 mmHg (09/25 1200) SpO2:  [99 %-100 %] 100 % (09/25 1200) FiO2 (%):  [40 %-100 %] 40 % (09/25 1200)  Intake/Output from previous day: 09/24 0701 - 09/25 0700 In: 3698.6 [P.O.:120; I.V.:2558.6; IV Piggyback:1020] Out: 3160 [Urine:1860; Chest Tube:1300] Intake/Output from this shift: Total I/O In: 1032 [I.V.:492; IV Piggyback:540] Out: 550 [Urine:550]  Physical Exam: Critically ill appearing on mechanical ventilation. HEENT: Unremarkable except for ETT tube in place Neck JVD 7 cm Lungs:  Scattered rales and rhonchi, no wheezes HEART:  Regular rate rhythm, no murmurs, no rubs, no clicks Abd:  Flat, positive bowel sounds, no organomegally, no rebound, no guarding Ext:  2 plus pulses, no edema, no cyanosis, no clubbing Skin:  No rashes no nodules Neuro:  CN II through XII intact, motor grossly intact  Lab Results:  Basename 08/16/12 0845 08/16/12 0410  WBC 12.5* 12.7*  HGB 12.4* 12.9*  PLT 138* 142*    Basename 08/16/12 0410 08/15/12 0445  NA 130* 133*  K 3.4* 4.0  CL 96 97  CO2 26 23  GLUCOSE 115* 149*  BUN 14 11  CREATININE 0.95 0.76    Basename 08/14/12 0549 08/14/12 0130  TROPONINI <0.30 <0.30   Hepatic Function Panel  Basename 08/13/12 1324  PROT 6.6  ALBUMIN 3.5  AST 30  ALT 18  ALKPHOS 59  BILITOT 0.4  BILIDIR --  IBILI --    Basename 08/16/12 0410  CHOL 130   No results found for this basename: PROTIME in the last 72 hours  Imaging: Ct Chest Wo Contrast  08/15/2012  *RADIOLOGY REPORT*  Clinical Data: Myocardial infarction with extreme shortness of breath.  History of lung cancer.  CT CHEST WITHOUT CONTRAST   Technique:  Multidetector CT imaging of the chest was performed following the standard protocol without IV contrast.  Comparison: Radiographs 08/13/2012.  Chest CT 12/31/2008.  Findings: Within the limitations of noncontrast technique, no residual discretely enlarged left hilar or mediastinal lymph nodes are demonstrated.  Right hilar assessment is limited by adjacent airspace disease.  There are moderate right and small left pleural effusions.  There are patchy airspace opacities with air bronchograms in both lungs. On the right, there is central perihilar component as well as a more peripheral component in the right upper lobe.  On the left, there are scattered components in the upper and lower lobes. There is some central airway narrowing bilaterally.  A femoral pacemaker is noted.  There is atherosclerosis of the coronary arteries.  The visualized upper abdomen appears unremarkable.  There are no acute or suspicious osseous findings.  IMPRESSION:  1.  Moderate right and small left pleural effusions with associated dependent atelectasis in both lungs. 2.  Patchy perihilar airspace opacities in both lungs have developed over the last several days and are suspicious for pneumonia. 3.  A residual right hilar mass is difficult to exclude on this noncontrast examination given the adjacent airspace disease. Follow-up imaging or bronchoscopy may be warranted.   Original Report Authenticated By: Gerrianne Scale, M.D.    Dg Chest Port 1 View  08/16/2012  *RADIOLOGY REPORT*  Clinical  Data: Respiratory failure.  PORTABLE CHEST - 1 VIEW  Comparison: 08/15/2012 and CT chest 08/15/2012.  Findings: Endotracheal tube terminates approximately 2.5 cm above the carina.  Right IJ central line tip projects over the SVC. Right chest tube is in place.  There is patchy bilateral air space disease, left greater than right, with perihilar predominance.  Probable small right pleural effusion.  Difficult to exclude small right apical  pneumothorax.  IMPRESSION:  1.  Patchy bilateral air space disease, left greater than right, perihilar predominant, likely stable from 08/15/2012, given slight decrease in lung volume. 2.  Small right pleural effusion with possible tiny right apical pneumothorax and right chest tube in place.   Original Report Authenticated By: Reyes Ivan, M.D.    Dg Chest Port 1 View  08/15/2012  *RADIOLOGY REPORT*  Clinical Data: Status post thoracentesis.  PORTABLE CHEST - 1 VIEW  Comparison: Earlier today at 1124 hours and chest CT of earlier today.  Findings: 1537 hours.  Right internal jugular line terminating at mid SVC.  Endotracheal tube terminates 2.7 cm above carina.  A nasogastric tube terminates at the body of the stomach, with side port likely above the gastroesophageal junction.  There is an inferiorly placed right ventricular pacer.  Right-sided chest tube is new in the interval. No pneumothorax.  Normal heart size.  Mild right hemidiaphragm elevation.  Probable small right pleural effusion.  Improved perihilar interstitial and airspace opacities.  IMPRESSION:  1.  Improved aeration, with decreased interstitial and airspace disease.  Likely improving pulmonary edema. 2.  Right-sided chest tube in place, without pneumothorax. 3.  Nasogastric tube tip at proximal stomach.  Consider advancement. 4.  Endotracheal tube remains borderline low in position.  Consider retraction 2 cm.  This study was made a "call report".   Original Report Authenticated By: Consuello Bossier, M.D.    Dg Chest Port 1 View  08/15/2012  *RADIOLOGY REPORT*  Clinical Data: Endotracheal tube and central line placement.  PORTABLE CHEST - 1 VIEW  Comparison: Chest CT today.  Radiographs 08/13/2012.  Findings: 1124 hours.  Interval intubation.  The endotracheal tube tip is just above the carina (1 cm superior to it).  There is a right IJ central venous catheter at the level of the lower SVC. As demonstrated on today's CT, there are increased  perihilar airspace opacities bilaterally concerning for pneumonia or acute pulmonary edema.  There are right greater than left pleural effusions.  No pneumothorax is seen.  IMPRESSION:  1.  Endotracheal tube is low, just above the carina. 2.  Central line tip is at the level of the lower SVC. 3.  Bilateral air space opacities and pleural effusions as demonstrated on CT consistent with pneumonia or acute pulmonary edema.   Original Report Authenticated By: Gerrianne Scale, M.D.     Cardiac Studies: Tele - ventricular pacing with NSVT, many runs Assessment/Plan:  1. MMVT 2. PMVT 3. Acute systolic CHF 4. Pneumonia\ 5. Hypotension 6. VDRF secondary to all of the above. Rec: a difficult last several days. I would recommend restarting IV lidocaine at low dose of 1 mg/min. Consider stopping RV pacing. At some point the line will need to be removed and I suspect that RV apical pacing is having deleterious hemodynamic effect. He could have an atrial temporary pacemaker placed to promote AV conduction and keep his heart rate elevated. If he were to have worsening ventricular arrhythmias off of ventricular pacing. Overall prognosis is guarded.  LOS: 3 days  Gregg Taylor,M.D. 08/16/2012, 12:20 PM

## 2012-08-16 NOTE — Progress Notes (Signed)
Name: [name] [name] MRN: [id number] DOB: 1956-04-09    LOS: 3  Referring Provider:  [name] (cards) Reason for Referral:  Recurrent VT, hx small cell lung ca  PULMONARY / CRITICAL CARE MEDICINE  HPI:  56 yo male smoker admitted 08/13/2012 with syncope and sustained VT with STEMI.  He had emergent cath with PCI/stent and pacer.  Noted to have abnormal CXR with hx of SCLC (Tx 3 yrs ago) and PCCM consulted 9/23.  Developed respiratory distress with VDRF and found to have PNA and moderate Rt pleural effusion on CT chest.  Pleural fluid was consistent with empyema and chest tube placed. PMHx systolic CHF (EF 78%)  Lines/tubes: ETT 9/24>> Rt IJ CVL 9/24>> Rt chest tube 9/24>>  Cultures: Sputum 9/24>> Blood 9/24>> Rt pleural fluid 9/24>>  Antiotics: Vancomycin 9/24>> Zosyn 9/24>>  Tests/events: 9//23 Echo>>EF 25 to 30% 9/24 VDRF, pulmonary infiltrates, pleural effusion, chest tube 9/24 Rt pleural fluid>>protein 2.9, LDH 124, glucose 153, WBC 1452 (3N, 89L, 37M, 0E) 9/24 CT chest>>patchy perihilar ASD b/l, moderate right pleural effusion  Subjective: Pressors overnight.  Off lidocaine.  Remains on amiodarone.  Vital Signs: Temp:  [98.7 F (37.1 C)-100 F (37.8 C)] 98.8 F (37.1 C) (09/25 0700) Pulse Rate:  [52-101] 94  (09/25 0700) Resp:  [15-41] 15  (09/25 0700) BP: (74-119)/(45-78) 93/54 mmHg (09/25 0700) SpO2:  [92 %-100 %] 100 % (09/25 0700) FiO2 (%):  [40 %-100 %] 40 % (09/25 0425)  I/O last 3 completed shifts: In: 5226.7 [P.O.:300; I.V.:3906.7; IV Piggyback:1020] Out: 3910 [Urine:2510; Emesis/NG output:100; Chest Tube:1300]  CVP 5  Physical Examination: General - no distress HEENT - ETT, OG tube in place Cardiac - s2 s2 regular Chest - scattered rhonchi, no wheeze Abd - soft, non tender Ext - no edema Neuro - sedated, easily arousable, follows commands  Ct Chest Wo Contrast  08/15/2012  *RADIOLOGY REPORT*  Clinical Data: Myocardial infarction with extreme  shortness of breath.  History of lung cancer.  CT CHEST WITHOUT CONTRAST  Technique:  Multidetector CT imaging of the chest was performed following the standard protocol without IV contrast.  Comparison: Radiographs 08/13/2012.  Chest CT 12/31/2008.  Findings: Within the limitations of noncontrast technique, no residual discretely enlarged left hilar or mediastinal lymph nodes are demonstrated.  Right hilar assessment is limited by adjacent airspace disease.  There are moderate right and small left pleural effusions.  There are patchy airspace opacities with air bronchograms in both lungs. On the right, there is central perihilar component as well as a more peripheral component in the right upper lobe.  On the left, there are scattered components in the upper and lower lobes. There is some central airway narrowing bilaterally.  A femoral pacemaker is noted.  There is atherosclerosis of the coronary arteries.  The visualized upper abdomen appears unremarkable.  There are no acute or suspicious osseous findings.  IMPRESSION:  1.  Moderate right and small left pleural effusions with associated dependent atelectasis in both lungs. 2.  Patchy perihilar airspace opacities in both lungs have developed over the last several days and are suspicious for pneumonia. 3.  A residual right hilar mass is difficult to exclude on this noncontrast examination given the adjacent airspace disease. Follow-up imaging or bronchoscopy may be warranted.   Original Report Authenticated By: Gerrianne Scale, M.D.    Dg Chest Port 1 View  08/15/2012  *RADIOLOGY REPORT*  Clinical Data: Status post thoracentesis.  PORTABLE CHEST - 1 VIEW  Comparison: Earlier today  at 1124 hours and chest CT of earlier today.  Findings: 1537 hours.  Right internal jugular line terminating at mid SVC.  Endotracheal tube terminates 2.7 cm above carina.  A nasogastric tube terminates at the body of the stomach, with side port likely above the gastroesophageal  junction.  There is an inferiorly placed right ventricular pacer.  Right-sided chest tube is new in the interval. No pneumothorax.  Normal heart size.  Mild right hemidiaphragm elevation.  Probable small right pleural effusion.  Improved perihilar interstitial and airspace opacities.  IMPRESSION:  1.  Improved aeration, with decreased interstitial and airspace disease.  Likely improving pulmonary edema. 2.  Right-sided chest tube in place, without pneumothorax. 3.  Nasogastric tube tip at proximal stomach.  Consider advancement. 4.  Endotracheal tube remains borderline low in position.  Consider retraction 2 cm.  This study was made a "call report".   Original Report Authenticated By: Consuello Bossier, M.D.    Dg Chest Port 1 View  08/15/2012  *RADIOLOGY REPORT*  Clinical Data: Endotracheal tube and central line placement.  PORTABLE CHEST - 1 VIEW  Comparison: Chest CT today.  Radiographs 08/13/2012.  Findings: 1124 hours.  Interval intubation.  The endotracheal tube tip is just above the carina (1 cm superior to it).  There is a right IJ central venous catheter at the level of the lower SVC. As demonstrated on today's CT, there are increased perihilar airspace opacities bilaterally concerning for pneumonia or acute pulmonary edema.  There are right greater than left pleural effusions.  No pneumothorax is seen.  IMPRESSION:  1.  Endotracheal tube is low, just above the carina. 2.  Central line tip is at the level of the lower SVC. 3.  Bilateral air space opacities and pleural effusions as demonstrated on CT consistent with pneumonia or acute pulmonary edema.   Original Report Authenticated By: Gerrianne Scale, M.D.     BMET Lab Results  Component Value Date   CREATININE 0.95 08/16/2012   BUN 14 08/16/2012   NA 130* 08/16/2012   K 3.4* 08/16/2012   CL 96 08/16/2012   CO2 26 08/16/2012    CBC Lab Results  Component Value Date   WBC 12.7* 08/16/2012   HGB 12.9* 08/16/2012   HCT 36.7* 08/16/2012   MCV 90.0  08/16/2012   PLT 142* 08/16/2012   Lab Results  Component Value Date   ALT 18 08/13/2012   AST 30 08/13/2012   ALKPHOS 59 08/13/2012   BILITOT 0.4 08/13/2012   BNP    Component Value Date/Time   PROBNP 2610.0* 08/14/2012 0130   ABG    Component Value Date/Time   PHART 7.507* 08/16/2012 0309   PCO2ART 31.3* 08/16/2012 0309   PO2ART 333.0* 08/16/2012 0309   HCO3 24.5* 08/16/2012 0309   TCO2 25.4 08/16/2012 0309   O2SAT 99.9 08/16/2012 0309   CBG (last 3)   Basename 08/16/12 0719 08/16/12 0400 08/15/12 2312  GLUCAP 122* 116* 95     ASSESSMENT AND PLAN  A: Acute respiratory failure with pulmonary infiltrates, and right pleural effusion.  Rt pleural effusion noted to be purulent, but numbers show transudate with predominance of lymphocytes.  Hx of SCLC.  ?PNA vs recurrence of maligancy. P: Continue full vent support Adjust PEEP/FiO2 to keep SpO2 > 92% F/u CXR and ABG Continue chest tube D 2/x vancomycin, zosyn F/u pleural fluid cytology May need bronchoscopy when more stable Send pleural fluid for ADA  A: Shock ?cardiogenic vs septic. P: Wean  pressors to keep MAP > 65 Goal CVP > 6  A: Recurrent VT with STEMI.  Systolic CHF. P: Per cardiology and EP cardiology  Updated family at bedside.  A: Hyperglycemia. P: SSI  A: Nutrition. P: Start tube feeds while on vent   Critical care time 50 minutes.  Coralyn Helling, MD [facility] Pulmonary/Critical Care 08/16/2012, 8:17 AM Pager:  [phone] After 3pm call: [phone]

## 2012-08-16 NOTE — Progress Notes (Signed)
[facility]  Subjective: Intubated. Yesterday's events noted.  CV collapse while in CT scanner - requiring Intubation, R sided Chest Tube placed, placed on Levophed gtt; Lidocaine added for recurrent Monomorphic VT. Broad Spectrum Abx initiated for ? Empyema -- strangely, this was mostly lymphocytes. Levophed weaned to 13mcg/min by this AM & Lidocaine weaned.  Objective: Vital signs in last 24 hours: Temp:  [98.8 F (37.1 C)-100 F (37.8 C)] 98.8 F (37.1 C) (09/25 0700) Pulse Rate:  [52-101] 90  (09/25 0805) Resp:  [14-41] 15  (09/25 0805) BP: (74-119)/(45-78) 84/53 mmHg (09/25 0800) SpO2:  [92 %-100 %] 100 % (09/25 0805) FiO2 (%):  [40 %-100 %] 40 % (09/25 0805) Last BM Date: 08/13/12  Intake/Output from previous day: 09/24 0701 - 09/25 0700 In: 3698.6 [P.O.:120; I.V.:2558.6; IV Piggyback:1020] Out: 3160 [Urine:1860; Chest Tube:1300] Intake/Output this shift: Total I/O In: 132.8 [I.V.:47.8; IV Piggyback:85] Out: 200 [Urine:200]  Medications Current Facility-Administered Medications  Medication Dose Route Frequency Provider Last Rate Last Dose  . 0.9 %  sodium chloride infusion   Intravenous Continuous Eda Paschal [place], [place] 50 mL/hr at 08/14/12 1519 50 mL/hr at 08/14/12 1519  . acetaminophen (TYLENOL) tablet 650 mg  650 mg Oral Q4H PRN Lennette Bihari, MD      . amiodarone (NEXTERONE PREMIX) 360 mg/200 mL dextrose IV infusion  60 mg/hr Intravenous Continuous Abelino Derrick, PA 33.3 mL/hr at 08/16/12 0754 60 mg/hr at 08/16/12 0754  . antiseptic oral rinse (BIOTENE) solution 15 mL  15 mL Mouth Rinse QID Lennette Bihari, MD   15 mL at 08/16/12 0403  . aspirin EC tablet 81 mg  81 mg Oral Daily Lennette Bihari, MD   81 mg at 08/15/12 1022  . chlorhexidine (PERIDEX) 0.12 % solution 15 mL  15 mL Mouth Rinse BID Lennette Bihari, MD   15 mL at 08/16/12 0754  . chlorhexidine (PERIDEX) 0.12 % solution           . etomidate (AMIDATE) 2 MG/ML injection        20 mg  at 08/15/12 1215  . fentaNYL (SUBLIMAZE) 0.05 MG/ML injection        100 mcg at 08/15/12 1214  . fentaNYL (SUBLIMAZE) 0.05 MG/ML injection           . fentaNYL (SUBLIMAZE) 10 mcg/mL in sodium chloride 0.9 % 250 mL infusion  50-400 mcg/hr Intravenous Titrated Alyson Reedy, MD 5 mL/hr at 08/15/12 1900 50 mcg/hr at 08/15/12 1900   And  . fentaNYL (SUBLIMAZE) bolus via infusion 50-100 mcg  50-100 mcg Intravenous Q6H PRN Alyson Reedy, MD      . insulin aspart (novoLOG) injection 2-6 Units  2-6 Units Subcutaneous Q4H Simonne Martinet, NP   2 Units at 08/15/12 1656  . levalbuterol (XOPENEX) nebulizer solution 0.63 mg  0.63 mg Nebulization Q6H PRN Abelino Derrick, PA   0.63 mg at 08/15/12 1008  . lidocaine (cardiac) 100 mg/17ml (XYLOCAINE) 20 MG/ML injection 2%           . lidocaine (cardiac) IV  infusion 4 mg/mL  1-4 mg/min Intravenous Continuous Chrystie Nose, MD 30 mL/hr at 08/15/12 1800 2 mg/min at 08/15/12 1800  . metoprolol tartrate (LOPRESSOR) tablet 12.5 mg  12.5 mg Oral Q6H Eda Paschal Manteno, PA   12.5 mg at 08/15/12 0616  . midazolam (VERSED) 1 mg/mL in sodium chloride 0.9 % 50 mL infusion  2-10 mg/hr Intravenous Titrated Wesam G  Molli Knock, MD 2 mL/hr at 08/16/12 0413 2 mg/hr at 08/16/12 0413   And  . midazolam (VERSED) bolus via infusion 1-2 mg  1-2 mg Intravenous Q2H PRN Alyson Reedy, MD      . midazolam (VERSED) 2 MG/2ML injection        2 mg at 08/15/12 1213  . midazolam (VERSED) 2 MG/2ML injection        2 mg at 08/15/12 1130  . norepinephrine (LEVOPHED) 4 mg in dextrose 5 % 250 mL infusion  2-50 mcg/min Intravenous Titrated Alyson Reedy, MD 7.5 mL/hr at 08/16/12 0800 2 mcg/min at 08/16/12 0800  . ondansetron (ZOFRAN) injection 4 mg  4 mg Intravenous Q4H PRN Chrystie Nose, MD   4 mg at 08/15/12 0207  . pantoprazole sodium (PROTONIX) 40 mg/20 mL oral suspension 40 mg  40 mg Per Tube Q24H Coralyn Helling, MD      . piperacillin-tazobactam (ZOSYN) IVPB 3.375 g  3.375 g Intravenous Q8H Benny Lennert, PHARMD   3.375 g at 08/16/12 0405  . potassium phosphate 30 mmol in dextrose 5 % 500 mL infusion  30 mmol Intravenous Once Oretha Milch, MD   30 mmol at 08/16/12 0617  . ramipril (ALTACE) capsule 1.25 mg  1.25 mg Oral Daily Eda Paschal Chili, PA   1.25 mg at 08/15/12 1022  . rocuronium (ZEMURON) 50 MG/5ML injection        80 mg at 08/15/12 1216  . succinylcholine (ANECTINE) 20 MG/ML injection           . Ticagrelor (BRILINTA) tablet 90 mg  90 mg Oral BID Lennette Bihari, MD   90 mg at 08/15/12 2307  . vancomycin (VANCOCIN) 1,750 mg in sodium chloride 0.9 % 500 mL IVPB  1,750 mg Intravenous Once Benny Lennert, PHARMD   1,750 mg at 08/15/12 1211  . vancomycin (VANCOCIN) IVPB 1000 mg/200 mL premix  1,000 mg Intravenous Q12H Benny Lennert, PHARMD   1,000 mg at 08/16/12 0012  . DISCONTD: ALPRAZolam Prudy Feeler) tablet 0.25 mg  0.25 mg Oral TID PRN Nada Boozer, NP   0.25 mg at 08/15/12 0854  . DISCONTD: antiseptic oral rinse (BIOTENE) solution 15 mL  15 mL Mouth Rinse BID Lennette Bihari, MD   15 mL at 08/15/12 0800  . DISCONTD: atropine 1 MG/ML injection           . DISCONTD: heparin ADULT infusion 100 units/mL (25000 units/250 mL)  1,450 Units/hr Intravenous Continuous Benny Lennert, PHARMD 14.5 mL/hr at 08/15/12 1800 1,450 Units/hr at 08/15/12 1800  . DISCONTD: insulin aspart (novoLOG) injection 0-9 Units  0-9 Units Subcutaneous TID WC Nada Boozer, NP   1 Units at 08/15/12 1230  . DISCONTD: morphine 2 MG/ML injection 2 mg  2 mg Intravenous Q3H PRN Lennette Bihari, MD      . DISCONTD: pantoprazole (PROTONIX) injection 40 mg  40 mg Intravenous Q24H Nelda Bucks, MD   40 mg at 08/15/12 1649  . DISCONTD: zolpidem (AMBIEN) tablet 10 mg  10 mg Oral QHS PRN Abelino Derrick, PA   10 mg at 08/13/12 2137    PE: General appearance: Intubated and sedated. Lungs: Bilateral rhonchi Heart: regular rate and rhythm Abdomen: +BS Extremities: No LEE Pulses: 2+ and symmetric, 1+ right DP, 2+  left DP Skin: Warm and dry.  Lab Results:   Basename 08/16/12 0410 08/15/12 0445 08/14/12 0530  WBC 12.7* 17.4* 9.5  HGB 12.9* 14.3 13.3  HCT 36.7* 41.7 38.7*  PLT 142* 223 219   BMET  Basename 08/16/12 0410 08/15/12 0445 08/14/12 0130  NA 130* 133* 136  K 3.4* 4.0 3.9  CL 96 97 102  CO2 26 23 22   GLUCOSE 115* 149* 101*  BUN 14 11 8   CREATININE 0.95 0.76 0.62  CALCIUM 8.0* 8.3* 7.9*   PT/INR  Basename 08/13/12 1324  LABPROT 14.2  INR 1.11   Cholesterol  Basename 08/16/12 0410  CHOL 130   Lipid Panel     Component Value Date/Time   CHOL 130 08/16/2012 0410   TRIG 103 08/16/2012 0410   HDL 35* 08/16/2012 0410   CHOLHDL 3.7 08/16/2012 0410   VLDL 21 08/16/2012 0410   LDLCALC 74 08/16/2012 0410   Studies/Results: CT CHEST WITHOUT CONTRAST  Technique: Multidetector CT imaging of the chest was performed following the standard protocol without IV contrast.  Comparison: Radiographs 08/13/2012. Chest CT 12/31/2008.  Findings: Within the limitations of noncontrast technique, no residual discretely enlarged left hilar or mediastinal lymph nodes are demonstrated. Right hilar assessment is limited by adjacent airspace disease.  There are moderate right and small left pleural effusions. There are patchy airspace opacities with air bronchograms in both lungs.  On the right, there is central perihilar component as well as a more peripheral component in the right upper lobe. On the left, there are scattered components in the upper and lower lobes. There is some central airway narrowing bilaterally. A femoral pacemaker is noted. There is atherosclerosis of the coronary arteries. The visualized upper abdomen appears unremarkable. There are no acute or suspicious osseous findings.  IMPRESSION:  1. Moderate right and small left pleural effusions with associated dependent atelectasis in both lungs.  2. Patchy perihilar airspace opacities in both lungs have developed over the last several days  and are suspicious for pneumonia.  3. A residual right hilar mass is difficult to exclude on this noncontrast examination given the adjacent airspace disease.  Follow-up imaging or bronchoscopy may be warranted.  PORTABLE CHEST - 1 VIEW  Comparison: 08/15/2012 and CT chest 08/15/2012.  Findings: Endotracheal tube terminates approximately 2.5 cm above the carina. Right IJ central line tip projects over the SVC. Right chest tube is in place.  There is patchy bilateral air space disease, left greater than right, with perihilar predominance. Probable small right pleural effusion. Difficult to exclude small right apical pneumothorax.  IMPRESSION:  1. Patchy bilateral air space disease, left greater than right, perihilar predominant, likely stable from 08/15/2012, given slight decrease in lung volume.  2. Small right pleural effusion with possible tiny right apical pneumothorax and right chest tube in place.  Assessment/Plan   Principal Problem:  *Sustained ventricular tachycardia with associated syncope; Monomorphic Active Problems:  Hx of cancer of lung, 3 years ago treated with radiation and chemo and resolved  Cardiomyopathy, ischemic, by cardiac cath 30-35% 08/13/12   CAD (coronary artery disease) 3 vessel disease  S/P coronary artery stent placement, acutely to RCA for acute Post. MI with BMS, 08/13/12  Pacemaker, temporary placed 08/13/12  Hypoxemia  Acute respiratory failure  Pneumonia, organism unspecified  Shock - unclear if Cardiogenic vs. Septic  Empyema of right pleural space - lymphocyte predominant.  STEMI (ST elevation myocardial infarction) -- With negative cardiac biomarkers  Hyperglycemia, pt has been on meds for diabetes in the past  Lung mass  GERD (gastroesophageal reflux disease)  Plan:  S/P chest tube placement for empyema.  S/p ? STEMI vs VT arrest with  PCI to RCA with BM Vision stent 9/22 & ostial LAD occlusion noted. NSVT 9/23 requiring shock times 2.  Intermittent V  pacing.  PVCs 4-5 beat NSVT Lidocaine and levophed turned off.  Amiodarone at 60mg /hr (goal for full load is 10g).   Hypotensive. MAP 61   LOS: 3 days   HAGER, BRYAN 08/16/2012 7:48 AM  ATTENDING ATTESTATION:  I have seen and examined the patient along with Wilburt Finlay, PA.  I have reviewed the chart, notes and new data.  I agree with Brya's findings, examination & recommendations as noted above.  Brief Description: Very pleasant 56 y/o man with h/o Small Cell Lung Ca treated ~3 yrs ago who presented 9/23 as a STEMI with sustained Monomorphic VT -- found to have chronic LAD occlusion & presumed culprit was RCA -- PCI with BMS (also noted LCx disease); EF ~30-35%. Interestingly, troponin levels never increased -- therefore NOT STEMI. Syncope due to VT. Yesterday -- when down for CT to evaluate Lung Mass --> developed respiratory (hypoxic) requiring intubation, Chest tube placement & initiation of pressors for BP support.   Key examination changes: agree with exam above.  Key new findings / data: Troponin Negative.    With decreasing PEEP - HRs increased, physiologically hard to explain beyond perturbing the system.  Very perplexing situation with R FV TPM in place pacing currently at 96bpm (4mA Output) -- intention in to entrain the RV to overdrive pace out of VT. Still having frequent ectopy & brief 2-5 beats of VT despite continued full dose of Amiodarone IV & Lidocaine overnight.  PLAN:  Hope for a "quiet day" to allow for BP stabilization & vent weaning.  If recurrent VT occurs, will try to overdrive pace 1st, but would then need to consider Mexiletine as the next line -- BB worked best to drop catecholamine drive, but BP would not tolerate.   ?? If he would benefit from "acute" VT ablation, will readdress with EP.  With target MAP of ~60, hope to wean off Levophed today (will help reducing catecholamine drive).  With RFV TPM, will need to consider moving to non-femoral access  site by tomorrow if TPM still required.  ?? If we ask EP to simply place ICD.  The other concerning issues include this "empyema" & lung mass / pneumonia.  Appreciate PCCM assistance with management of Abx, Ventilator & likely TF initiation if needed.  This patient is critically ill with multiple organ system involvement requiring advanced level of cardiac, pulmonary and infection support.  Critical Care time ~45 min with adjusting TPM settings & determining contingency plans for recurrent VT.  Marykay Lex, M.D., M.S. [facility] [address]. [address] [place], [place]  [address]  [phone]  08/16/2012 8:45 AM

## 2012-08-16 NOTE — Progress Notes (Signed)
Chaplain responded to Code blue page at [phone]. Chaplain found medical Team working with patient in the room to get his heart rate back to normal. Chaplain found patient's wife in the room and provided words of encouragement and comfort to family member. Patient heart rate was back to normal later. Chaplain will continue to visit and provide spiritual care to both patient and family member at later time as needed.

## 2012-08-16 NOTE — Progress Notes (Signed)
INITIAL ADULT NUTRITION ASSESSMENT Date: 08/16/2012   Time: 9:29 AM  Reason for Assessment: Consult- TF initiation and management   INTERVENTION: 1. Initiate Osmolite 1.2 @ 20 ml/hr via OG and increase by 10 ml every 4 hours to goal rate of 30 ml/hr. 30 ml Prostat 8 times daily.  At goal rate, tube feeding regimen will provide 1664 kcal, 160 grams of protein, and 590 ml of H2O.   2. If no IV fluids, will require additional free water flush to meet hydration needs  3. Daily multivitamin via tube  4. RD will continue to follow      DOCUMENTATION CODES Per approved criteria  -Obesity Unspecified     ASSESSMENT: Male 56 y.o.  Dx: Sustained ventricular tachycardia  Hx:  Past Medical History  Diagnosis Date  . Cancer     lung  . STEMI (ST elevation myocardial infarction) 08/13/2012  . Sustained ventricular tachycardia with associated syncope 08/13/2012  . Hx of cancer of lung, 3 years ago treated with radiation and chemo and resolved 08/13/2012  . GERD (gastroesophageal reflux disease) 08/13/2012  . Hyperglycemia 08/13/2012  . Cardiomyopathy, ischemic, by cardiac cath 30-35% 08/13/12  08/13/2012  . CAD (coronary artery disease) 3 vessel disease 08/13/2012  . S/P coronary artery stent placement, acutely to RCA for acute Post. MI with BMS, 08/13/12 08/13/2012    Past Surgical History  Procedure Date  . No past surgeries   . Cardiac catheterization     Related Meds:     . antiseptic oral rinse  15 mL Mouth Rinse QID  . aspirin EC  81 mg Oral Daily  . chlorhexidine  15 mL Mouth Rinse BID  . chlorhexidine      . etomidate      . fentaNYL      . fentaNYL      . insulin aspart  2-6 Units Subcutaneous Q4H  . lidocaine (cardiac) 100 mg/89ml      . metoprolol tartrate  12.5 mg Oral Q6H  . midazolam      . midazolam      . pantoprazole sodium  40 mg Per Tube Q24H  . piperacillin-tazobactam (ZOSYN)  IV  3.375 g Intravenous Q8H  . potassium phosphate IVPB (mmol)  30 mmol Intravenous  Once  . ramipril  1.25 mg Oral Daily  . rocuronium      . succinylcholine      . Ticagrelor  90 mg Oral BID  . vancomycin  1,750 mg Intravenous Once  . vancomycin  1,000 mg Intravenous Q12H  . DISCONTD: antiseptic oral rinse  15 mL Mouth Rinse BID  . DISCONTD: insulin aspart  0-9 Units Subcutaneous TID WC  . DISCONTD: pantoprazole (PROTONIX) IV  40 mg Intravenous Q24H     Ht: 6' (182.9 cm)  Wt: 231 lb 7.7 oz (105 kg)  Ideal Wt: 81 kg  % Ideal Wt: 130%  Usual Wt: unknown- pt intubated and unable to provide hx, no family in room at time of RD visit  Wt Readings from Last 5 Encounters:  08/13/12 231 lb 7.7 oz (105 kg)  08/13/12 231 lb 7.7 oz (105 kg)    % Usual Wt: --  Body mass index is 31.39 kg/(m^2). Pt is obese class 1 per current BMI   Food/Nutrition Related Hx: No recent unintentional weight loss or poor appetite per MST (Malnutrition Screening Tool) on admission   Labs:  CMP     Component Value Date/Time   NA 130* 08/16/2012 0410  K 3.4* 08/16/2012 0410   CL 96 08/16/2012 0410   CO2 26 08/16/2012 0410   GLUCOSE 115* 08/16/2012 0410   BUN 14 08/16/2012 0410   CREATININE 0.95 08/16/2012 0410   CALCIUM 8.0* 08/16/2012 0410   PROT 6.6 08/13/2012 1324   ALBUMIN 3.5 08/13/2012 1324   AST 30 08/13/2012 1324   ALT 18 08/13/2012 1324   ALKPHOS 59 08/13/2012 1324   BILITOT 0.4 08/13/2012 1324   GFRNONAA >90 08/16/2012 0410   GFRAA >90 08/16/2012 0410    Intake/Output Summary (Last 24 hours) at 08/16/12 0932 Last data filed at 08/16/12 0900  Gross per 24 hour  Intake 3588.6 ml  Output   3160 ml  Net  428.6 ml     Diet Order:   none- RD will clarify to NPO   Supplements/Tube Feeding: none   IVF:    sodium chloride Last Rate: 50 mL/hr (08/14/12 1519)  amiodarone (NEXTERONE PREMIX) 360 mg/200 mL dextrose Last Rate: 60 mg/hr (08/16/12 0754)  fentaNYL infusion INTRAVENOUS Last Rate: 50 mcg/hr (08/15/12 1900)  lidocaine Last Rate: 2 mg/min (08/15/12 1800)  midazolam  (VERSED) infusion Last Rate: 2 mg/hr (08/16/12 0413)  norepinephrine (LEVOPHED) Adult infusion Last Rate: 2 mcg/min (08/16/12 0900)  DISCONTD: heparin Last Rate: 1,450 Units/hr (08/15/12 1800)   Patient is currently intubated on ventilator support.  MV: 11.2 Temp:Temp (24hrs), Avg:99.4 F (37.4 C), Min:98.8 F (37.1 C), Max:100 F (37.8 C)  Propofol: none    Estimated Nutritional Needs:   Kcal: 2302 kcal (1381-1622 kcal underfeeding goal) Protein: 150-162 gm  Fluid:  1.5 L   Pt was admitted after syncopal episode while at work, pt found to have sustained V-tach. Awake/alert on admission, did not require intubation at that time. Pt with right sided pleural effusion and infiltrate. Hx of lung cancer. Pt intubated on 9/24 for thoracentesis, chest tube insertion. Question of empyema vs reoccurrence of malignancy.    Pt remains intubated, RD was consulted for initiation and management of EN. Pt with OG tube verified by CXR. Pt meets criteria for permissive underfeeding per ASPEN guidelines.    NUTRITION DIAGNOSIS: -Inadequate oral intake (NI-2.1).  Status: Ongoing  RELATED TO: inability to eat  AS EVIDENCE BY: mechanical ventilation   MONITORING/EVALUATION(Goals): Goal: Enteral nutrition to provide 60-70% of estimated calorie needs (22-25 kcals/kg ideal body weight) and >/= 90% of estimated protein needs, based on ASPEN guidelines for permissive underfeeding in critically ill obese individuals.  Monitor: EN initiation/tolerance, weight, labs, vent status   EDUCATION NEEDS: -No education needs identified at this time   Clarene Duke RD, LDN Pager [phone] After Hours pager [phone]  08/16/2012, 9:29 AM

## 2012-08-16 NOTE — Progress Notes (Signed)
eLink Physician-Brief Progress Note Patient Name: [name] [name] DOB: 09-21-1956 MRN: [id number]  Date of Service  08/16/2012   HPI/Events of Note     eICU Interventions  Hypokalemia , hypophos -repleted    Intervention Category Intermediate Interventions: Electrolyte abnormality - evaluation and management  Kimbrely Buckel V. 08/16/2012, 5:01 AM

## 2012-08-17 ENCOUNTER — Inpatient Hospital Stay (HOSPITAL_COMMUNITY): Payer: BC Managed Care – PPO

## 2012-08-17 DIAGNOSIS — I959 Hypotension, unspecified: Secondary | ICD-10-CM | POA: Diagnosis present

## 2012-08-17 DIAGNOSIS — E876 Hypokalemia: Secondary | ICD-10-CM | POA: Diagnosis not present

## 2012-08-17 DIAGNOSIS — J90 Pleural effusion, not elsewhere classified: Secondary | ICD-10-CM | POA: Diagnosis not present

## 2012-08-17 LAB — COMPREHENSIVE METABOLIC PANEL
ALT: 8 U/L (ref 0–53)
AST: 12 U/L (ref 0–37)
Albumin: 2.3 g/dL — ABNORMAL LOW (ref 3.5–5.2)
CO2: 27 mEq/L (ref 19–32)
Chloride: 97 mEq/L (ref 96–112)
GFR calc non Af Amer: >90 mL/min (ref >90)
Sodium: 133 mEq/L — ABNORMAL LOW (ref 135–145)
Total Bilirubin: 0.6 mg/dL (ref 0.3–1.2)

## 2012-08-17 LAB — GLUCOSE, CAPILLARY
Glucose-Capillary: 123 mg/dL — ABNORMAL HIGH (ref 70–99)
Glucose-Capillary: 127 mg/dL — ABNORMAL HIGH (ref 70–99)
Glucose-Capillary: 134 mg/dL — ABNORMAL HIGH (ref 70–99)
Glucose-Capillary: 135 mg/dL — ABNORMAL HIGH (ref 70–99)
Glucose-Capillary: 157 mg/dL — ABNORMAL HIGH (ref 70–99)

## 2012-08-17 LAB — CULTURE, BLOOD (ROUTINE X 2)

## 2012-08-17 LAB — POCT I-STAT 3, ART BLOOD GAS (G3+)
O2 Saturation: 99 %
TCO2: 29 mmol/L (ref 0–100)
pCO2 arterial: 36.4 mmHg (ref 35.0–45.0)
pH, Arterial: 7.498 — ABNORMAL HIGH (ref 7.350–7.450)
pO2, Arterial: 125 mmHg — ABNORMAL HIGH (ref 80.0–100.0)

## 2012-08-17 LAB — VANCOMYCIN, TROUGH: Vancomycin Tr: 5.8 ug/mL — ABNORMAL LOW (ref 10.0–20.0)

## 2012-08-17 LAB — LIDOCAINE LEVEL: Lidocaine Lvl: 3.7 ug/mL (ref 1.5–5.0)

## 2012-08-17 MED ORDER — POTASSIUM CHLORIDE 20 MEQ/15ML (10%) PO LIQD
40.0000 meq | Freq: Once | ORAL | Status: AC
  Administered 2012-08-17: 40 meq
  Filled 2012-08-17: qty 30

## 2012-08-17 MED ORDER — INSULIN ASPART 100 UNIT/ML ~~LOC~~ SOLN
0.0000 [IU] | SUBCUTANEOUS | Status: DC
  Administered 2012-08-17 – 2012-08-18 (×3): 3 [IU] via SUBCUTANEOUS
  Administered 2012-08-18: 4 [IU] via SUBCUTANEOUS
  Administered 2012-08-18 – 2012-08-19 (×5): 3 [IU] via SUBCUTANEOUS

## 2012-08-17 MED ORDER — POTASSIUM CHLORIDE 10 MEQ/50ML IV SOLN
10.0000 meq | INTRAVENOUS | Status: AC
  Administered 2012-08-17 (×3): 10 meq via INTRAVENOUS
  Filled 2012-08-17: qty 50
  Filled 2012-08-17: qty 100

## 2012-08-17 MED ORDER — VANCOMYCIN HCL 1000 MG IV SOLR
1250.0000 mg | Freq: Three times a day (TID) | INTRAVENOUS | Status: DC
  Administered 2012-08-17 – 2012-08-18 (×4): 1250 mg via INTRAVENOUS
  Filled 2012-08-17 (×5): qty 1250

## 2012-08-17 MED ORDER — POTASSIUM CHLORIDE 20 MEQ/15ML (10%) PO LIQD
40.0000 meq | ORAL | Status: AC
  Administered 2012-08-17 (×4): 40 meq
  Filled 2012-08-17 (×4): qty 30

## 2012-08-17 MED ORDER — POTASSIUM CHLORIDE 20 MEQ/15ML (10%) PO LIQD
ORAL | Status: AC
  Filled 2012-08-17: qty 30

## 2012-08-17 MED FILL — Medication: Qty: 1 | Status: AC

## 2012-08-17 NOTE — Progress Notes (Signed)
Patient ID: [name] [name], male   DOB: 08/05/1956, 56 y.o.   MRN: [id number] Subjective:  Remains awake and intubated  Objective:  Vital Signs in the last 24 hours: Temp:  [98.6 F (37 C)-99.7 F (37.6 C)] 99.1 F (37.3 C) (09/26 0400) Pulse Rate:  [39-122] 96  (09/26 0721) Resp:  [12-18] 18  (09/26 0721) BP: (72-111)/(38-74) 94/63 mmHg (09/26 0721) SpO2:  [100 %] 100 % (09/26 0721) FiO2 (%):  [30 %-40 %] 30 % (09/26 0721) Weight:  [242 lb 4.6 oz (109.9 kg)] 242 lb 4.6 oz (109.9 kg) (09/26 0400)  Intake/Output from previous day: 09/25 0701 - 09/26 0700 In: 4045.7 [I.V.:2658.2; NG/GT:460; IV Piggyback:927.5] Out: 4050 [Urine:3700; Chest Tube:350] Intake/Output from this shift:    Physical Exam: Chronically ill appearing NAD HEENT: Unremarkable except ETT tube in place Neck:  No JVD, no thyromegally Lungs:  Rales in the bases HEART:  Regular rate rhythm, no murmurs, no rubs, no clicks Abd:  soft, positive bowel sounds, no organomegally, no rebound, no guarding Ext:  2 plus pulses, no edema, no cyanosis, no clubbing Skin:  No rashes no nodules Neuro:  CN II through XII intact, motor grossly intact  Lab Results:  Basename 08/16/12 0845 08/16/12 0410  WBC 12.5* 12.7*  HGB 12.4* 12.9*  PLT 138* 142*    Basename 08/17/12 0445 08/16/12 0410  NA 133* 130*  K 2.9* 3.4*  CL 97 96  CO2 27 26  GLUCOSE 155* 115*  BUN 10 14  CREATININE 0.73 0.95   No results found for this basename: TROPONINI:2,CK,MB:2 in the last 72 hours Hepatic Function Panel  Basename 08/17/12 0445  PROT 5.1*  ALBUMIN 2.3*  AST 12  ALT 8  ALKPHOS 42  BILITOT 0.6  BILIDIR --  IBILI --    Basename 08/16/12 0410  CHOL 130   No results found for this basename: PROTIME in the last 72 hours  Imaging: Ct Chest Wo Contrast  08/15/2012  *RADIOLOGY REPORT*  Clinical Data: Myocardial infarction with extreme shortness of breath.  History of lung cancer.  CT CHEST WITHOUT CONTRAST  Technique:   Multidetector CT imaging of the chest was performed following the standard protocol without IV contrast.  Comparison: Radiographs 08/13/2012.  Chest CT 12/31/2008.  Findings: Within the limitations of noncontrast technique, no residual discretely enlarged left hilar or mediastinal lymph nodes are demonstrated.  Right hilar assessment is limited by adjacent airspace disease.  There are moderate right and small left pleural effusions.  There are patchy airspace opacities with air bronchograms in both lungs. On the right, there is central perihilar component as well as a more peripheral component in the right upper lobe.  On the left, there are scattered components in the upper and lower lobes. There is some central airway narrowing bilaterally.  A femoral pacemaker is noted.  There is atherosclerosis of the coronary arteries.  The visualized upper abdomen appears unremarkable.  There are no acute or suspicious osseous findings.  IMPRESSION:  1.  Moderate right and small left pleural effusions with associated dependent atelectasis in both lungs. 2.  Patchy perihilar airspace opacities in both lungs have developed over the last several days and are suspicious for pneumonia. 3.  A residual right hilar mass is difficult to exclude on this noncontrast examination given the adjacent airspace disease. Follow-up imaging or bronchoscopy may be warranted.   Original Report Authenticated By: Gerrianne Scale, M.D.    Dg Chest Port 1 View  08/17/2012  *RADIOLOGY  REPORT*  Clinical Data: Follow up pulmonary infiltrates.  Right pleural effusion.  PORTABLE CHEST - 1 VIEW  Comparison: 08/16/2012.  Findings: Right IJ central line is present along with the endotracheal tube.  Endotracheal tube tip is 14 mm from the carina. Nasogastric tube is present with the tip within the stomach.  This is new compared to prior.  Right thoracostomy tube is present.  No right pneumothorax.  Left pleural effusion and basilar atelectasis. Compared  yesterday's exam, the left pleural effusion appears slightly more prominent which may be positional shift.  IMPRESSION:  1.  Unchanged endotracheal tube and right IJ central line. Unchanged right thoracostomy tube without pneumothorax. 2.  New nasogastric tube with the tip in the stomach. 3.  Persistent left pleural effusion with probable positional shift and associated atelectasis.   Original Report Authenticated By: Andreas Newport, M.D.    Dg Chest Port 1 View  08/16/2012  *RADIOLOGY REPORT*  Clinical Data: Respiratory failure.  PORTABLE CHEST - 1 VIEW  Comparison: 08/15/2012 and CT chest 08/15/2012.  Findings: Endotracheal tube terminates approximately 2.5 cm above the carina.  Right IJ central line tip projects over the SVC. Right chest tube is in place.  There is patchy bilateral air space disease, left greater than right, with perihilar predominance.  Probable small right pleural effusion.  Difficult to exclude small right apical pneumothorax.  IMPRESSION:  1.  Patchy bilateral air space disease, left greater than right, perihilar predominant, likely stable from 08/15/2012, given slight decrease in lung volume. 2.  Small right pleural effusion with possible tiny right apical pneumothorax and right chest tube in place.   Original Report Authenticated By: Reyes Ivan, M.D.    Dg Chest Port 1 View  08/15/2012  *RADIOLOGY REPORT*  Clinical Data: Status post thoracentesis.  PORTABLE CHEST - 1 VIEW  Comparison: Earlier today at 1124 hours and chest CT of earlier today.  Findings: 1537 hours.  Right internal jugular line terminating at mid SVC.  Endotracheal tube terminates 2.7 cm above carina.  A nasogastric tube terminates at the body of the stomach, with side port likely above the gastroesophageal junction.  There is an inferiorly placed right ventricular pacer.  Right-sided chest tube is new in the interval. No pneumothorax.  Normal heart size.  Mild right hemidiaphragm elevation.  Probable small  right pleural effusion.  Improved perihilar interstitial and airspace opacities.  IMPRESSION:  1.  Improved aeration, with decreased interstitial and airspace disease.  Likely improving pulmonary edema. 2.  Right-sided chest tube in place, without pneumothorax. 3.  Nasogastric tube tip at proximal stomach.  Consider advancement. 4.  Endotracheal tube remains borderline low in position.  Consider retraction 2 cm.  This study was made a "call report".   Original Report Authenticated By: Consuello Bossier, M.D.    Dg Chest Port 1 View  08/15/2012  *RADIOLOGY REPORT*  Clinical Data: Endotracheal tube and central line placement.  PORTABLE CHEST - 1 VIEW  Comparison: Chest CT today.  Radiographs 08/13/2012.  Findings: 1124 hours.  Interval intubation.  The endotracheal tube tip is just above the carina (1 cm superior to it).  There is a right IJ central venous catheter at the level of the lower SVC. As demonstrated on today's CT, there are increased perihilar airspace opacities bilaterally concerning for pneumonia or acute pulmonary edema.  There are right greater than left pleural effusions.  No pneumothorax is seen.  IMPRESSION:  1.  Endotracheal tube is low, just above the carina. 2.  Central line tip is at the level of the lower SVC. 3.  Bilateral air space opacities and pleural effusions as demonstrated on CT consistent with pneumonia or acute pulmonary edema.   Original Report Authenticated By: Gerrianne Scale, M.D.     Cardiac Studies: Tele - V. Pacing with NSPMVT Assessment/Plan:  1. VT, both monomorphic and now polymorphic - he is improved on Lidocaine. Continue amio for now 2. VDRF 3. Acute CHF - Discussed with Dr. Jomarie Longs. We agree that RV temp PM needs removal as it has been in for 5 days. I would recommend temp perm atrial lead.  4. 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  Rec: change out RV temp to RA temp perm PPM. Supportive care. Followup lidocaine level.  Prognosis guarded.  LOS: 4 days    Buel Ream.D. 08/17/2012, 8:16 AM

## 2012-08-17 NOTE — Progress Notes (Signed)
eLink Physician-Brief Progress Note Patient Name: [name] [name] DOB: 07-06-1956 MRN: [id number]  Date of Service  08/17/2012   HPI/Events of Note     eICU Interventions  Hypokalemia -repleted    Intervention Category Intermediate Interventions: Electrolyte abnormality - evaluation and management  [name] V. 08/17/2012, 5:56 AM

## 2012-08-17 NOTE — Progress Notes (Signed)
Name: [name] [name] MRN: [id number] DOB: Sep 08, 1956    LOS: 4  Referring Provider:  [name] (cards) Reason for Referral:  Recurrent VT, hx small cell lung ca  PULMONARY / CRITICAL CARE MEDICINE  HPI:  56 yo male smoker admitted 08/13/2012 with syncope and sustained VT with STEMI.  He had emergent cath with PCI/stent and pacer.  Noted to have abnormal CXR with hx of SCLC (Tx 3 yrs ago) and PCCM consulted 9/23.  Developed respiratory distress with VDRF and found to have PNA and moderate Rt pleural effusion on CT chest.  Pleural fluid was consistent with empyema and chest tube placed. PMHx systolic CHF (EF 40%)  Lines/tubes: ETT 9/24>> Rt IJ CVL 9/24>> Rt chest tube 9/24>>  Cultures: Sputum 9/24>> Blood 9/24>> Rt pleural fluid 9/24>> Urine 9/24>>negative  Antiotics: Vancomycin 9/24>> Zosyn 9/24>>  Tests/events: 9//23 Echo>>EF 25 to 30% 9/24 VDRF, pulmonary infiltrates, pleural effusion, chest tube 9/24 Rt pleural fluid>>protein 2.9, LDH 124, glucose 153, WBC 1452 (3N, 89L, 38M, 0E).  Cytology negative for malignancy 9/24 CT chest>>patchy perihilar ASD b/l, moderate right pleural effusion  Subjective: Weaning off pressors.  Remains on lidocaine and amiodarone.  Vital Signs: Temp:  [98.6 F (37 C)-99.8 F (37.7 C)] 99.8 F (37.7 C) (09/26 0800) Pulse Rate:  [39-122] 72  (09/26 0800) Resp:  [12-18] 15  (09/26 0800) BP: (72-111)/(38-74) 95/62 mmHg (09/26 0800) SpO2:  [99 %-100 %] 99 % (09/26 0800) FiO2 (%):  [30 %-40 %] 30 % (09/26 0721) Weight:  [242 lb 4.6 oz (109.9 kg)] 242 lb 4.6 oz (109.9 kg) (09/26 0400)  I/O last 3 completed shifts: In: 5271.2 [I.V.:3413.7; NG/GT:460; IV Piggyback:1397.5] Out: 5060 [Urine:4410; Chest Tube:650]  CVP 8  Physical Examination: General - no distress HEENT - ETT, OG tube in place Cardiac - s2 s2 regular Chest - scattered rhonchi, no wheeze, Rt chest tube in place Abd - soft, non tender Ext - no edema Neuro - sedated, easily  arousable, follows commands  Dg Chest Port 1 View  08/17/2012  *RADIOLOGY REPORT*  Clinical Data: Follow up pulmonary infiltrates.  Right pleural effusion.  PORTABLE CHEST - 1 VIEW  Comparison: 08/16/2012.  Findings: Right IJ central line is present along with the endotracheal tube.  Endotracheal tube tip is 14 mm from the carina. Nasogastric tube is present with the tip within the stomach.  This is new compared to prior.  Right thoracostomy tube is present.  No right pneumothorax.  Left pleural effusion and basilar atelectasis. Compared yesterday's exam, the left pleural effusion appears slightly more prominent which may be positional shift.  IMPRESSION:  1.  Unchanged endotracheal tube and right IJ central line. Unchanged right thoracostomy tube without pneumothorax. 2.  New nasogastric tube with the tip in the stomach. 3.  Persistent left pleural effusion with probable positional shift and associated atelectasis.   Original Report Authenticated By: Andreas Newport, M.D.    Dg Chest Port 1 View  08/16/2012  *RADIOLOGY REPORT*  Clinical Data: Respiratory failure.  PORTABLE CHEST - 1 VIEW  Comparison: 08/15/2012 and CT chest 08/15/2012.  Findings: Endotracheal tube terminates approximately 2.5 cm above the carina.  Right IJ central line tip projects over the SVC. Right chest tube is in place.  There is patchy bilateral air space disease, left greater than right, with perihilar predominance.  Probable small right pleural effusion.  Difficult to exclude small right apical pneumothorax.  IMPRESSION:  1.  Patchy bilateral air space disease, left greater than right, perihilar predominant,  likely stable from 08/15/2012, given slight decrease in lung volume. 2.  Small right pleural effusion with possible tiny right apical pneumothorax and right chest tube in place.   Original Report Authenticated By: Reyes Ivan, M.D.    Dg Chest Port 1 View  08/15/2012  *RADIOLOGY REPORT*  Clinical Data: Status post  thoracentesis.  PORTABLE CHEST - 1 VIEW  Comparison: Earlier today at 1124 hours and chest CT of earlier today.  Findings: 1537 hours.  Right internal jugular line terminating at mid SVC.  Endotracheal tube terminates 2.7 cm above carina.  A nasogastric tube terminates at the body of the stomach, with side port likely above the gastroesophageal junction.  There is an inferiorly placed right ventricular pacer.  Right-sided chest tube is new in the interval. No pneumothorax.  Normal heart size.  Mild right hemidiaphragm elevation.  Probable small right pleural effusion.  Improved perihilar interstitial and airspace opacities.  IMPRESSION:  1.  Improved aeration, with decreased interstitial and airspace disease.  Likely improving pulmonary edema. 2.  Right-sided chest tube in place, without pneumothorax. 3.  Nasogastric tube tip at proximal stomach.  Consider advancement. 4.  Endotracheal tube remains borderline low in position.  Consider retraction 2 cm.  This study was made a "call report".   Original Report Authenticated By: Consuello Bossier, M.D.    Dg Chest Port 1 View  08/15/2012  *RADIOLOGY REPORT*  Clinical Data: Endotracheal tube and central line placement.  PORTABLE CHEST - 1 VIEW  Comparison: Chest CT today.  Radiographs 08/13/2012.  Findings: 1124 hours.  Interval intubation.  The endotracheal tube tip is just above the carina (1 cm superior to it).  There is a right IJ central venous catheter at the level of the lower SVC. As demonstrated on today's CT, there are increased perihilar airspace opacities bilaterally concerning for pneumonia or acute pulmonary edema.  There are right greater than left pleural effusions.  No pneumothorax is seen.  IMPRESSION:  1.  Endotracheal tube is low, just above the carina. 2.  Central line tip is at the level of the lower SVC. 3.  Bilateral air space opacities and pleural effusions as demonstrated on CT consistent with pneumonia or acute pulmonary edema.   Original  Report Authenticated By: Gerrianne Scale, M.D.     BMET Lab Results  Component Value Date   CREATININE 0.73 08/17/2012   BUN 10 08/17/2012   NA 133* 08/17/2012   K 2.9* 08/17/2012   CL 97 08/17/2012   CO2 27 08/17/2012    CBC Lab Results  Component Value Date   WBC 12.5* 08/16/2012   HGB 12.4* 08/16/2012   HCT 36.2* 08/16/2012   MCV 90.3 08/16/2012   PLT 138* 08/16/2012   Lab Results  Component Value Date   ALT 8 08/17/2012   AST 12 08/17/2012   ALKPHOS 42 08/17/2012   BILITOT 0.6 08/17/2012   BNP    Component Value Date/Time   PROBNP 2574.0* 08/17/2012 0445   ABG    Component Value Date/Time   PHART 7.498* 08/17/2012 0447   PCO2ART 36.4 08/17/2012 0447   PO2ART 125.0* 08/17/2012 0447   HCO3 28.1* 08/17/2012 0447   TCO2 29 08/17/2012 0447   O2SAT 99.0 08/17/2012 0447   CBG (last 3)   Basename 08/17/12 0808 08/17/12 0404 08/17/12 0027  GLUCAP 157* 139* 127*     ASSESSMENT AND PLAN  A: Acute respiratory failure with pulmonary infiltrates, and right pleural effusion.  Rt pleural effusion noted  to appear purulent, but numbers show transudate with predominance of lymphocytes, and cytology negative for malignancy.  Hx of SCLC.  ?PNA vs recurrence of maligancy. P: Continue full vent support Adjust PEEP/FiO2 to keep SpO2 > 92% F/u CXR Continue chest tube>>change to -10 suction D 3/x vancomycin, zosyn Will likely need bronchoscopy when more stable f/u pleural fluid for ADA  A: Shock ?cardiogenic vs septic. P: Wean pressors to keep MAP > 65 Goal CVP > 6  A: Recurrent VT with STEMI.  Systolic CHF. P: Per cardiology and EP cardiology  A: Hypokalemia. P: Replace and f/u BMET, Mg  A: Hyperglycemia. P: SSI  A: Nutrition. P: Tube feeds while on vent  Best Practice DVT >> SCD's SUP >> Protonix HOB > 30 degrees Daily WUA Defer SBT for now since he remains of pressors with unstable heart rhythm  Updated wife at bedside  Critical care time 40 minutes.  Coralyn Helling, MD [facility] Pulmonary/Critical Care 08/17/2012, 10:17 AM Pager:  [phone] After 3pm call: [phone]

## 2012-08-17 NOTE — Progress Notes (Signed)
Subjective:  Intubated, sedated  Objective:  Vital Signs in the last 24 hours: Temp:  [98.6 F (37 C)-99.7 F (37.6 C)] 99.1 F (37.3 C) (09/26 0400) Pulse Rate:  [39-122] 96  (09/26 0721) Resp:  [12-18] 18  (09/26 0721) BP: (72-111)/(38-74) 94/63 mmHg (09/26 0721) SpO2:  [100 %] 100 % (09/26 0721) FiO2 (%):  [30 %-40 %] 30 % (09/26 0721) Weight:  [109.9 kg (242 lb 4.6 oz)] 109.9 kg (242 lb 4.6 oz) (09/26 0400)  Intake/Output from previous day:  Intake/Output Summary (Last 24 hours) at 08/17/12 0800 Last data filed at 08/17/12 0700  Gross per 24 hour  Intake 3862.85 ml  Output   3850 ml  Net  12.85 ml    Physical Exam: General appearance: intubated Lungs: wheezing, chest tube Rt Heart: regular rate and rhythm   Rate: 90  Rhythm: normal sinus rhythm and PVCs, NSVT  Lab Results:  Basename 08/16/12 0845 08/16/12 0410  WBC 12.5* 12.7*  HGB 12.4* 12.9*  PLT 138* 142*    Basename 08/17/12 0445 08/16/12 0410  NA 133* 130*  K 2.9* 3.4*  CL 97 96  CO2 27 26  GLUCOSE 155* 115*  BUN 10 14  CREATININE 0.73 0.95   No results found for this basename: TROPONINI:2,CK,MB:2 in the last 72 hours Hepatic Function Panel  Basename 08/17/12 0445  PROT 5.1*  ALBUMIN 2.3*  AST 12  ALT 8  ALKPHOS 42  BILITOT 0.6  BILIDIR --  IBILI --    Basename 08/16/12 0410  CHOL 130   No results found for this basename: INR in the last 72 hours  Imaging: Imaging results have been reviewed  Cardiac Studies:  Assessment/Plan:   Principal Problem:  *Sustained ventricular tachycardia with associated syncope; Monomorphic Active Problems:  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  Pleural effusion, intubated, chest tube place 08/16/12  Hx of cancer of lung, 3 years ago treated with radiation and chemo and resolved  Cardiomyopathy, ischemic, by cardiac cath 30-35% 08/13/12   S/P coronary  artery stent placement, acutely to RCA for acute Post. MI with BMS, 08/13/12  Pacemaker, temporary placed 08/13/12  Hypotension, on pressors  Hypokalemia  GERD (gastroesophageal reflux disease)  Hyperglycemia, pt has been on meds for diabetes in the past   Plan- EP to see, K+ ordered. Will d/c arterial line.   Corine Shelter PA-C 08/17/2012, 8:00 AM  I have seen and examined the patient along with Corine Shelter, PA-C.  I have reviewed the chart, notes and new data.  I agree with PA's note.  Key new complaints: intubated, appears calm and comfortable Key examination changes: wheezes bilaterally Key new findings / data: severe hypokalemia, very brief NSVT  PLAN: Still on pressors and two antiarrhythmics, with major electrolyte imbalance. Despite considerable diuresis his weight is 10 kg over admission weight. Likely not ready for extubation yet. His femoral lines are 56 days old and will need to be replaced. He might benefit from A pacing rather than V pacing in the setting of cardiomyopathy. Plan to change to a IJ temporary atrial lead after correction of K level. Continue supportive care. Information re: recurrence of SCCa of lung may make a big difference in our degree of aggressivity in managing his cardiac problems.   Thurmon Fair, MD, [facility] [phone] 08/17/2012, 8:13 AM

## 2012-08-17 NOTE — Progress Notes (Signed)
Peep already reduced to 5 when RT entered room. RR reduced to 12 per MD order.

## 2012-08-17 NOTE — Progress Notes (Signed)
ID: Afeb, WBC 12.5<12.7<17.4<9.5, chest CT-large right pleural effusion and diffuse infiltrate; s/p thoracentesis--pus in pleural space; on empiric abx for PNA. Scr stable. Pleural fluid, urine, blood cx NGTD.  Vancomycin trough  5.8 (Goal 15-20). Lats dose of vancomycin was 1g at 1236.  Plan: Day 3 abx with pleural fluid cytology (-) organism w/ cx pending--bronch when stable   -Change vanc to 1250mg  iv q8h, 1st dose at 1830. -Cont Zosyn 3.375g IV Q 8 H -Repeat vancomycin trough at steady state -Monitor renal function

## 2012-08-18 ENCOUNTER — Inpatient Hospital Stay (HOSPITAL_COMMUNITY): Payer: BC Managed Care – PPO

## 2012-08-18 ENCOUNTER — Encounter (HOSPITAL_COMMUNITY)
Admission: EM | Disposition: A | Discharge status: Home / Self Care | Payer: Self-pay | Source: Home / Self Care | Attending: Cardiovascular Disease

## 2012-08-18 LAB — CBC
Hemoglobin: 12.6 g/dL — ABNORMAL LOW (ref 13.0–17.0)
MCH: 31.1 pg (ref 26.0–34.0)
RBC: 4.05 MIL/uL — ABNORMAL LOW (ref 4.22–5.81)

## 2012-08-18 LAB — ADENOSINE DEAMINASE, FLUID: Adenosine Deaminase, Fluid: 7.8 U/L — ABNORMAL HIGH (ref <7.6)

## 2012-08-18 LAB — BASIC METABOLIC PANEL
BUN: 11 mg/dL (ref 6–23)
CO2: 27 mEq/L (ref 19–32)
Calcium: 8.4 mg/dL (ref 8.4–10.5)
Chloride: 97 mEq/L (ref 96–112)
Creatinine, Ser: 0.68 mg/dL (ref 0.50–1.35)
GFR calc Af Amer: >90 mL/min (ref >90)
GFR calc non Af Amer: >90 mL/min (ref >90)
Glucose, Bld: 152 mg/dL — ABNORMAL HIGH (ref 70–99)
Potassium: 4.3 mEq/L (ref 3.5–5.1)
Sodium: 132 mEq/L — ABNORMAL LOW (ref 135–145)

## 2012-08-18 LAB — GLUCOSE, CAPILLARY
Glucose-Capillary: 156 mg/dL — ABNORMAL HIGH (ref 70–99)
Glucose-Capillary: 140 mg/dL — ABNORMAL HIGH (ref 70–99)
Glucose-Capillary: 99 mg/dL (ref 70–99)

## 2012-08-18 LAB — MAGNESIUM: Magnesium: 1.9 mg/dL (ref 1.5–2.5)

## 2012-08-18 SURGERY — LEAD REVISION
Anesthesia: LOCAL

## 2012-08-18 MED ORDER — PROMOTE PO LIQD
1000.0000 mL | ORAL | Status: DC
  Administered 2012-08-18 – 2012-08-19 (×2): 1000 mL
  Filled 2012-08-18 (×4): qty 1000

## 2012-08-18 MED ORDER — PRO-STAT SUGAR FREE PO LIQD
30.0000 mL | Freq: Three times a day (TID) | ORAL | Status: DC
  Administered 2012-08-18 – 2012-08-19 (×4): 30 mL
  Filled 2012-08-18 (×6): qty 30

## 2012-08-18 MED ORDER — PRO-STAT SUGAR FREE PO LIQD
60.0000 mL | Freq: Three times a day (TID) | ORAL | Status: DC
  Filled 2012-08-18 (×2): qty 60

## 2012-08-18 MED ORDER — PRO-STAT SUGAR FREE PO LIQD
30.0000 mL | Freq: Every day | ORAL | Status: DC
  Filled 2012-08-18: qty 30

## 2012-08-18 NOTE — Progress Notes (Signed)
Name: [name] [name] MRN: [id number] DOB: [birth year]    LOS: 5  Referring Provider:  [name] (cards) Reason for Referral:  Recurrent VT, hx small cell lung ca  PULMONARY / CRITICAL CARE MEDICINE  HPI:  56 yo male smoker admitted 08/13/2012 with syncope and sustained VT with STEMI.  He had emergent cath with PCI/stent and pacer.  Noted to have abnormal CXR with hx of SCLC (Tx 3 yrs ago) and PCCM consulted 9/23.  Developed respiratory distress with VDRF and found to have PNA and moderate Rt pleural effusion on CT chest.  Pleural fluid was consistent with empyema and chest tube placed. PMHx systolic CHF (EF 40%)  Lines/tubes: ETT 9/24>> Rt IJ CVL 9/24>> Rt chest tube 9/24>>  Cultures: Sputum 9/24>>Few S aureus Blood 9/24>>Coag neg Staph Rt pleural fluid 9/24>> Urine 9/24>>negative  Antiotics: Vancomycin 9/24>> Zosyn 9/24>>  Tests/events: 9//23 Echo>>EF 25 to 30% 9/24 VDRF, pulmonary infiltrates, pleural effusion, chest tube 9/24 Rt pleural fluid>>protein 2.9, LDH 124, glucose 153, WBC 1452 (3N, 89L, 50M, 0E).  Cytology negative for malignancy 9/24 CT chest>>patchy perihilar ASD b/l, moderate right pleural effusion  Subjective: Weaning off pressors.  575 ml from Rt chest tube.   Vital Signs: Temp:  [99.2 F (37.3 C)-99.9 F (37.7 C)] 99.5 F (37.5 C) (09/27 0400) Pulse Rate:  [67-105] 96  (09/27 0600) Resp:  [11-33] 27  (09/27 0600) BP: (82-110)/(50-68) 82/61 mmHg (09/27 0600) SpO2:  [97 %-100 %] 99 % (09/27 0600) FiO2 (%):  [30 %] 30 % (09/27 0741) Weight:  [242 lb 1 oz (109.8 kg)] 242 lb 1 oz (109.8 kg) (09/27 0500)  I/O last 3 completed shifts: In: 81 [I.V.:3783; NG/GT:905; IV Piggyback:1166] Out: 4810 [Urine:4015; Chest Tube:795]  CVP 9  Physical Examination: General - no distress HEENT - ETT, OG tube in place Cardiac - s2 s2 regular Chest - no wheeze, Rt chest tube in place Abd - soft, non tender Ext - no edema Neuro - sedated, easily arousable, follows  commands  Dg Chest Port 1 View  08/18/2012  *RADIOLOGY REPORT*  Clinical Data: Assess endotracheal tube.  PORTABLE CHEST - 1 VIEW  Comparison: 08/17/2012.  Findings: Endotracheal tube tip is 14 mm from the carina, similar yesterday's exam.  Right thoracostomy tube is present.  Tiny right apical pneumothorax with pleural line at the apex.  Right IJ central line appears similar.  Enteric tube also appears similar. Pulmonary aeration appears little changed.  Left pleural effusion and basilar atelectasis.  There may be left basilar airspace disease as well.  IMPRESSION:  1.  Stable appearance of support apparatus. 2.  No interval change in pulmonary aeration.   Original Report Authenticated By: Andreas Newport, M.D.    Dg Chest Port 1 View  08/17/2012  *RADIOLOGY REPORT*  Clinical Data: Follow up pulmonary infiltrates.  Right pleural effusion.  PORTABLE CHEST - 1 VIEW  Comparison: 08/16/2012.  Findings: Right IJ central line is present along with the endotracheal tube.  Endotracheal tube tip is 14 mm from the carina. Nasogastric tube is present with the tip within the stomach.  This is new compared to prior.  Right thoracostomy tube is present.  No right pneumothorax.  Left pleural effusion and basilar atelectasis. Compared yesterday's exam, the left pleural effusion appears slightly more prominent which may be positional shift.  IMPRESSION:  1.  Unchanged endotracheal tube and right IJ central line. Unchanged right thoracostomy tube without pneumothorax. 2.  New nasogastric tube with the tip in the stomach. 3.  Persistent left pleural effusion with probable positional shift and associated atelectasis.   Original Report Authenticated By: Andreas Newport, M.D.     BMET Lab Results  Component Value Date   CREATININE 0.68 08/18/2012   BUN 11 08/18/2012   NA 132* 08/18/2012   K 4.3 08/18/2012   CL 97 08/18/2012   CO2 27 08/18/2012    CBC Lab Results  Component Value Date   WBC 11.1* 08/18/2012   HGB 12.6*  08/18/2012   HCT 36.6* 08/18/2012   MCV 90.4 08/18/2012   PLT 156 08/18/2012   Lab Results  Component Value Date   ALT 8 08/17/2012   AST 12 08/17/2012   ALKPHOS 42 08/17/2012   BILITOT 0.6 08/17/2012   BNP    Component Value Date/Time   PROBNP 2574.0* 08/17/2012 0445   ABG    Component Value Date/Time   PHART 7.498* 08/17/2012 0447   PCO2ART 36.4 08/17/2012 0447   PO2ART 125.0* 08/17/2012 0447   HCO3 28.1* 08/17/2012 0447   TCO2 29 08/17/2012 0447   O2SAT 99.0 08/17/2012 0447   CBG (last 3)   Basename 08/18/12 0747 08/18/12 0439 08/17/12 2303  GLUCAP 140* 156* 136*     ASSESSMENT AND PLAN  A: Acute respiratory failure with pulmonary infiltrates, and right pleural effusion.  Rt pleural effusion noted to appear purulent, but numbers show transudate with predominance of lymphocytes, and cytology negative for malignancy.  Hx of SCLC.  ?PNA vs recurrence of maligancy. P: Continue full vent support until more stable>>may be able to start vent wean 9/28 Adjust PEEP/FiO2 to keep SpO2 > 92% F/u CXR 9/28 Continue chest tube -10 suction>>likely need to keep in until off vent D 4/x vancomycin, zosyn Will likely need bronchoscopy when more stable f/u pleural fluid for ADA  A: Shock ?cardiogenic vs septic. P: Wean pressors to keep MAP > 65 Goal CVP > 6  A: Recurrent VT with STEMI.  Systolic CHF. P: Per cardiology and EP cardiology  A: Hypokalemia>>improved 9/27. P: f/u BMET  A: Hyperglycemia. P: SSI  A: Nutrition. P: Tube feeds while on vent  Best Practice DVT >> SCD's SUP >> Protonix HOB > 30 degrees Daily WUA Defer SBT for now since he remains of pressors with unstable heart rhythm  Updated wife at bedside.  Explained that working dx for lung infiltrates is pneumonia, but that there is still concern for possible malignancy.  If pulmonary infiltrates don't improve, then he may need bronchoscopy.  Critical care time 35 minutes.  Coralyn Helling, MD [facility]  Pulmonary/Critical Care 08/18/2012, 9:04 AM Pager:  [phone] After 3pm call: [phone]

## 2012-08-18 NOTE — Progress Notes (Addendum)
Nutrition Follow-up  Intervention:  1. Change TF formula to Promote, initiate at 30 ml/hr and advance by 10 q 4 hours to goal rate of 50 ml/hr  2. Decrease Pro-stat to TID.   This will provide 1600 kcal, 120 gm protein, and 1006 ml free water.  3. RD will continue to follow    Assessment:   Patient is currently intubated on ventilator support.  MV: 9.6 Temp:Temp (24hrs), Avg:99.5 F (37.5 C), Min:99.2 F (37.3 C), Max:99.9 F (37.7 C)  Propofol: none  Not weaning yet. Remains on Levophed, Lidocaine, an Amiodarone. Net positive fluids.   RD spoke with RN, current NG tubes are smaller then previously used Panda tubes, having difficulty getting Pro-stat down r/t thickness. RD will attempt to adjust formula/rate to reduce the number of Pro-stat required.   Diet Order: NPO  Meds: Scheduled Meds:   . antiseptic oral rinse  15 mL Mouth Rinse QID  . aspirin EC  81 mg Oral Daily  . chlorhexidine  15 mL Mouth Rinse BID  . feeding supplement (OSMOLITE 1.2 CAL)  1,000 mL Per Tube Q24H  . feeding supplement  60 mL Per Tube QID  . insulin aspart  0-20 Units Subcutaneous Q4H  . multivitamin  5 mL Per Tube Daily  . pantoprazole sodium  40 mg Per Tube Q24H  . piperacillin-tazobactam (ZOSYN)  IV  3.375 g Intravenous Q8H  . potassium chloride  10 mEq Intravenous Q1 Hr x 3  . potassium chloride  40 mEq Per Tube Q3H  . Ticagrelor  90 mg Oral BID  . vancomycin  1,250 mg Intravenous Q8H  . DISCONTD: insulin aspart  2-6 Units Subcutaneous Q4H  . DISCONTD: metoprolol tartrate  12.5 mg Oral Q6H  . DISCONTD: ramipril  1.25 mg Oral Daily  . DISCONTD: vancomycin  1,000 mg Intravenous Q12H   Continuous Infusions:   . sodium chloride    . amiodarone (NEXTERONE PREMIX) 360 mg/200 mL dextrose 60 mg/hr (08/18/12 0212)  . fentaNYL infusion INTRAVENOUS 50 mcg/hr (08/17/12 1100)  . lidocaine 1 mg/min (08/17/12 0500)  . midazolam (VERSED) infusion 4 mg/hr (08/18/12 0000)  . norepinephrine (LEVOPHED)  Adult infusion 3 mcg/min (08/18/12 0211)   PRN Meds:.acetaminophen, fentaNYL, levalbuterol, midazolam, ondansetron (ZOFRAN) IV  Labs:  CMP     Component Value Date/Time   NA 132* 08/18/2012 0440   K 4.3 08/18/2012 0440   CL 97 08/18/2012 0440   CO2 27 08/18/2012 0440   GLUCOSE 152* 08/18/2012 0440   BUN 11 08/18/2012 0440   CREATININE 0.68 08/18/2012 0440   CALCIUM 8.4 08/18/2012 0440   PROT 5.1* 08/17/2012 0445   ALBUMIN 2.3* 08/17/2012 0445   AST 12 08/17/2012 0445   ALT 8 08/17/2012 0445   ALKPHOS 42 08/17/2012 0445   BILITOT 0.6 08/17/2012 0445   GFRNONAA >90 08/18/2012 0440   GFRAA >90 08/18/2012 0440     Intake/Output Summary (Last 24 hours) at 08/18/12 0946 Last data filed at 08/18/12 0600  Gross per 24 hour  Intake 3426.9 ml  Output   2590 ml  Net  836.9 ml    Weight Status:  242 lbs, trending up with fluids.   Re-estimated needs:  2282 kcal, (1369 - 1597 kcal underfeeding goal), 150-162 gm protein   Nutrition Dx:  Inadequate oral intake r/t inability to eat AEB mechanical ventilation.   Goal:  Enteral nutrition to provide 60-70% of estimated calorie needs (22-25 kcals/kg ideal body weight) and >/= 90% of estimated protein needs, based on  ASPEN guidelines for permissive underfeeding in critically ill obese individuals.   Monitor:  EN, weight, labs, vent status   Clarene Duke RD, LDN Pager [phone] After Hours pager [phone]

## 2012-08-18 NOTE — Progress Notes (Signed)
Subjective:  Intubated, awake and responsive.  Objective:  Vital Signs in the last 24 hours: Temp:  [99.2 F (37.3 C)-99.9 F (37.7 C)] 99.5 F (37.5 C) (09/27 0400) Pulse Rate:  [67-105] 96  (09/27 0600) Resp:  [11-33] 27  (09/27 0600) BP: (82-110)/(50-68) 82/61 mmHg (09/27 0600) SpO2:  [97 %-100 %] 99 % (09/27 0600) FiO2 (%):  [30 %] 30 % (09/27 0741) Weight:  [109.8 kg (242 lb 1 oz)] 109.8 kg (242 lb 1 oz) (09/27 0500)  Intake/Output from previous day:  Intake/Output Summary (Last 24 hours) at 08/18/12 0755 Last data filed at 08/18/12 0600  Gross per 24 hour  Intake 3688.3 ml  Output   2590 ml  Net 1098.3 ml    Physical Exam: General appearance: cooperative, no distress and intubated Lungs: rhonchi, chest tube on Rt Heart: regular rate and rhythm   Rate: 100  Rhythm: paced, PACs, PVCs  Lab Results:  Basename 08/18/12 0440 08/16/12 0845  WBC 11.1* 12.5*  HGB 12.6* 12.4*  PLT 156 138*    Basename 08/18/12 0440 08/17/12 0445  NA 132* 133*  K 4.3 2.9*  CL 97 97  CO2 27 27  GLUCOSE 152* 155*  BUN 11 10  CREATININE 0.68 0.73   No results found for this basename: TROPONINI:2,CK,MB:2 in the last 72 hours Hepatic Function Panel  Basename 08/17/12 0445  PROT 5.1*  ALBUMIN 2.3*  AST 12  ALT 8  ALKPHOS 42  BILITOT 0.6  BILIDIR --  IBILI --    Basename 08/16/12 0410  CHOL 130   No results found for this basename: INR in the last 72 hours  Imaging: Imaging results have been reviewed  Cardiac Studies:  Assessment/Plan:   Principal Problem:  *Sustained ventricular tachycardia with associated syncope; Monomorphic Active Problems:  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  Pleural effusion, intubated, chest tube place 08/16/12  Hx of cancer of lung, 3 years ago treated with radiation and chemo and resolved  Cardiomyopathy, ischemic, by cardiac cath 30-35%  08/13/12   S/P coronary artery stent placement, acutely to RCA for acute Post. MI with BMS, 08/13/12  Pacemaker, temporary placed 08/13/12  Hypotension, on pressors  Hypokalemia  GERD (gastroesophageal reflux disease)  Hyperglycemia, pt has been on meds for diabetes in the past   Plan- Dr Royann Shivers to see and decide about pacer. He remains on Lidocaine 1mg /min and Amiodarone 60mg /hr. He is still hypotensive - on Levophed. His I/O is positive about 3.5L over the last 3 days, consider Lasix today.   Corine Shelter PA-C 08/18/2012, 7:55 AM   I have seen and examined the patient along with Corine Shelter PA-C.  I have reviewed the chart, notes and new data.  I agree with PA's note.  Key new complaints: no further VT; on nominal pressor support Key examination changes: stable NSR at 68-70 bpm when pacing rate decreased Key new findings / data: normal electrolytes  PLAN: I think we will be able to DC pacemaker altogether today. Will wait for a few hours and then remove if no pauses/bradycardia or polymorphic VT. Hopefully can wean norepi drip and then also remove A-line. Agree he may need diuresis, but try to get off drips first. Not in overt pulmonary edema now but this may change when we dc PPV. Need to decrease IV fluids. Once this is accomplished we can try to extubate.  Thurmon Fair, MD, [facility] [phone] 08/18/2012, 8:29 AM

## 2012-08-18 NOTE — Procedures (Deleted)
[name] [name], cath lab staff d/c'd art and venous sheath with TVP out of right groin per MD order. Pressure held 30min. Groin level0  

## 2012-08-18 NOTE — Progress Notes (Signed)
Stable NSR in 66-70 bpm range. Will DC the temporary transvenous pacemaker. DC lidocaine. Remove arterial line. If arterial BP monitoring becomes necessary will place a radial line.  Thurmon Fair, MD, [facility] [phone] office [phone] pager

## 2012-08-18 NOTE — Procedures (Signed)
[name] [name], cath lab staff d/c'd art and venous sheath with TVP out of right groin per MD order. Pressure held . Groin level0

## 2012-08-18 NOTE — Progress Notes (Signed)
ANTIBIOTIC CONSULT NOTE - FOLLOW UP  Pharmacy Consult for vancomycin Indication: pneumonia  No Known Allergies  Patient Measurements: Height: 6' (182.9 cm) Weight: 242 lb 1 oz (109.8 kg) IBW/kg (Calculated) : 77.6    Vital Signs: Temp: 99 F (37.2 C) (09/27 1600) Temp src: Oral (09/27 1600) BP: 95/56 mmHg (09/27 1900) Pulse Rate: 72  (09/27 1900) Intake/Output from previous day: 09/26 0701 - 09/27 0700 In: 4180.7 [I.V.:2452.2; NG/GT:755; IV Piggyback:853.5] Out: 2590 [Urine:2015; Chest Tube:575] Intake/Output from this shift:    Labs:  Basename 08/18/12 0440 08/17/12 0445 08/16/12 0845 08/16/12 0410  WBC 11.1* -- 12.5* 12.7*  HGB 12.6* -- 12.4* 12.9*  PLT 156 -- 138* 142*  LABCREA -- -- -- --  CREATININE 0.68 0.73 -- 0.95   Estimated Creatinine Clearance: 135.1 ml/min (by C-G formula based on Cr of 0.68).  Basename 08/18/12 1800 08/17/12 1230  VANCOTROUGH 107.4* 5.8*  VANCOPEAK -- --  Drue Dun -- --  GENTTROUGH -- --  GENTPEAK -- --  GENTRANDOM -- --  TOBRATROUGH -- --  TOBRAPEAK -- --  TOBRARND -- --  AMIKACINPEAK -- --  AMIKACINTROU -- --  AMIKACIN -- --     Microbiology: Recent Results (from the past 720 hour(s))  MRSA PCR SCREENING     Status: Normal   Collection Time   08/13/12  3:41 PM      Component Value Range Status Comment   MRSA by PCR NEGATIVE  NEGATIVE Final   CULTURE, RESPIRATORY     Status: Normal (Preliminary result)   Collection Time   08/15/12 11:09 AM      Component Value Range Status Comment   Specimen Description TRACHEAL ASPIRATE   Final    Special Requests NONE   Final    Gram Stain     Final    Value: RARE WBC PRESENT, PREDOMINANTLY PMN     RARE SQUAMOUS EPITHELIAL CELLS PRESENT     RARE GRAM POSITIVE COCCI     IN PAIRS   Culture     Final    Value: FEW STAPHYLOCOCCUS AUREUS     Note: RIFAMPIN AND GENTAMICIN SHOULD NOT BE USED AS SINGLE DRUGS FOR TREATMENT OF STAPH INFECTIONS.   Report Status PENDING   Incomplete     CULTURE, BLOOD (ROUTINE X 2)     Status: Normal   Collection Time   08/15/12 11:25 AM      Component Value Range Status Comment   Specimen Description BLOOD CENTRAL LINE   Final    Special Requests BOTTLES DRAWN AEROBIC AND ANAEROBIC 10CC   Final    Culture  Setup Time 08/15/2012 20:13   Final    Culture     Final    Value: STAPHYLOCOCCUS SPECIES (COAGULASE NEGATIVE)     Note: THE SIGNIFICANCE OF ISOLATING THIS ORGANISM FROM A SINGLE SET OF BLOOD CULTURES WHEN MULTIPLE SETS ARE DRAWN IS UNCERTAIN. PLEASE NOTIFY THE MICROBIOLOGY DEPARTMENT WITHIN ONE WEEK IF SPECIATION AND SENSITIVITIES ARE REQUIRED.     Note: Gram Stain Report Called to,Read Back By and Verified With: ERIN SMITH 08/16/12 1310 BY SMITJ   Report Status 08/17/2012 FINAL   Final   CULTURE, BLOOD (ROUTINE X 2)     Status: Normal (Preliminary result)   Collection Time   08/15/12 12:13 PM      Component Value Range Status Comment   Specimen Description BLOOD HAND RIGHT   Final    Special Requests BOTTLES DRAWN AEROBIC ONLY 8CC   Final  Culture  Setup Time 08/15/2012 20:14   Final    Culture     Final    Value:        BLOOD CULTURE RECEIVED NO GROWTH TO DATE CULTURE WILL BE HELD FOR 5 DAYS BEFORE ISSUING A FINAL NEGATIVE REPORT   Report Status PENDING   Incomplete   URINE CULTURE     Status: Normal   Collection Time   08/15/12 12:48 PM      Component Value Range Status Comment   Specimen Description URINE, CATHETERIZED   Final    Special Requests NONE   Final    Culture  Setup Time 08/15/2012 14:17   Final    Colony Count NO GROWTH   Final    Culture NO GROWTH   Final    Report Status 08/16/2012 FINAL   Final   BODY FLUID CULTURE     Status: Normal (Preliminary result)   Collection Time   08/15/12  3:24 PM      Component Value Range Status Comment   Specimen Description PLEURAL FLUID LEFT   Final    Special Requests 7.OML   Final    Gram Stain     Final    Value: MODERATE WBC PRESENT, PREDOMINANTLY MONONUCLEAR     NO  ORGANISMS SEEN   Culture NO GROWTH 2 DAYS   Final    Report Status PENDING   Incomplete   AFB CULTURE WITH SMEAR     Status: Normal (Preliminary result)   Collection Time   08/15/12  3:24 PM      Component Value Range Status Comment   Specimen Description PLEURAL FLUID LEFT   Final    Special Requests 7.0ML   Final    ACID FAST SMEAR NO ACID FAST BACILLI SEEN   Final    Culture     Final    Value: CULTURE WILL BE EXAMINED FOR 6 WEEKS BEFORE ISSUING A FINAL REPORT   Report Status PENDING   Incomplete   FUNGUS CULTURE W SMEAR     Status: Normal (Preliminary result)   Collection Time   08/15/12  3:24 PM      Component Value Range Status Comment   Specimen Description PLEURAL FLUID LEFT   Final    Special Requests 7.0ML   Final    Fungal Smear NO YEAST OR FUNGAL ELEMENTS SEEN   Final    Culture CULTURE IN PROGRESS FOR FOUR WEEKS   Final    Report Status PENDING   Incomplete     Anti-infectives     Start     Dose/Rate Route Frequency Ordered Stop   08/17/12 1830   vancomycin (VANCOCIN) 1,250 mg in sodium chloride 0.9 % 250 mL IVPB  Status:  Discontinued        1,250 mg 166.7 mL/hr over 90 Minutes Intravenous Every 8 hours 08/17/12 1410 08/18/12 1917   08/16/12 0030   vancomycin (VANCOCIN) IVPB 1000 mg/200 mL premix  Status:  Discontinued        1,000 mg 200 mL/hr over 60 Minutes Intravenous Every 12 hours 08/15/12 1115 08/17/12 1409   08/15/12 1200  piperacillin-tazobactam (ZOSYN) IVPB 3.375 g       3.375 g 12.5 mL/hr over 240 Minutes Intravenous Every 8 hours 08/15/12 1106     08/15/12 1200   vancomycin (VANCOCIN) 1,750 mg in sodium chloride 0.9 % 500 mL IVPB        1,750 mg 250 mL/hr over 120 Minutes Intravenous  Once  08/15/12 1114 08/15/12 1411          Assessment: 56 year old man being treated for pneumonia.  Trough yesterday reported as 5.8.  Dose increased and level repeated this evening.  Received call from RN - level = 107.4.  Dose this evening already infused.   Trough is unexpectedly high.    Goal of Therapy:  Vancomycin trough level 15-20 mcg/ml  Plan:  Hold further vancomycin doses.  Repeat level in AM to confirm value and redose vancomycin if needed.  Mickeal Skinner 08/18/2012,7:22 PM

## 2012-08-19 ENCOUNTER — Inpatient Hospital Stay (HOSPITAL_COMMUNITY): Payer: BC Managed Care – PPO

## 2012-08-19 LAB — BASIC METABOLIC PANEL
BUN: 12 mg/dL (ref 6–23)
CO2: 31 mEq/L (ref 19–32)
Calcium: 8.5 mg/dL (ref 8.4–10.5)
Chloride: 93 mEq/L — ABNORMAL LOW (ref 96–112)
Creatinine, Ser: 0.67 mg/dL (ref 0.50–1.35)
GFR calc Af Amer: >90 mL/min (ref >90)
GFR calc non Af Amer: >90 mL/min (ref >90)
Glucose, Bld: 98 mg/dL (ref 70–99)
Potassium: 3.8 mEq/L (ref 3.5–5.1)
Sodium: 132 mEq/L — ABNORMAL LOW (ref 135–145)

## 2012-08-19 LAB — BODY FLUID CULTURE: Culture: NO GROWTH

## 2012-08-19 LAB — CULTURE, RESPIRATORY W GRAM STAIN

## 2012-08-19 LAB — GLUCOSE, CAPILLARY
Glucose-Capillary: 114 mg/dL — ABNORMAL HIGH (ref 70–99)
Glucose-Capillary: 129 mg/dL — ABNORMAL HIGH (ref 70–99)
Glucose-Capillary: 141 mg/dL — ABNORMAL HIGH (ref 70–99)

## 2012-08-19 MED ORDER — VANCOMYCIN HCL 1000 MG IV SOLR
2000.0000 mg | Freq: Three times a day (TID) | INTRAVENOUS | Status: DC
  Administered 2012-08-19 – 2012-08-20 (×4): 2000 mg via INTRAVENOUS
  Filled 2012-08-19 (×6): qty 2000

## 2012-08-19 MED ORDER — FUROSEMIDE 10 MG/ML IJ SOLN
20.0000 mg | Freq: Once | INTRAMUSCULAR | Status: AC
  Administered 2012-08-19: 20 mg via INTRAVENOUS

## 2012-08-19 MED ORDER — POTASSIUM CHLORIDE 20 MEQ/15ML (10%) PO LIQD
40.0000 meq | Freq: Once | ORAL | Status: AC
  Administered 2012-08-19: 40 meq
  Filled 2012-08-19: qty 30

## 2012-08-19 MED ORDER — MAGNESIUM OXIDE 400 (241.3 MG) MG PO TABS
400.0000 mg | ORAL_TABLET | Freq: Every day | ORAL | Status: DC
  Filled 2012-08-19: qty 1

## 2012-08-19 MED ORDER — MAGNESIUM SULFATE 40 MG/ML IJ SOLN
2.0000 g | Freq: Once | INTRAMUSCULAR | Status: AC
  Administered 2012-08-19: 2 g via INTRAVENOUS
  Filled 2012-08-19: qty 50

## 2012-08-19 MED ORDER — FENTANYL CITRATE 0.05 MG/ML IJ SOLN
50.0000 ug | INTRAMUSCULAR | Status: DC | PRN
  Administered 2012-08-20: 50 ug via INTRAVENOUS
  Filled 2012-08-19: qty 2

## 2012-08-19 MED ORDER — FENTANYL CITRATE 0.05 MG/ML IJ SOLN
50.0000 ug | INTRAMUSCULAR | Status: DC | PRN
  Administered 2012-08-19: 200 ug via INTRAVENOUS
  Filled 2012-08-19: qty 4

## 2012-08-19 MED ORDER — POTASSIUM CHLORIDE 20 MEQ/15ML (10%) PO LIQD
20.0000 meq | Freq: Two times a day (BID) | ORAL | Status: DC
  Administered 2012-08-19: 20 meq via ORAL
  Filled 2012-08-19 (×2): qty 15

## 2012-08-19 MED ORDER — POTASSIUM CHLORIDE 20 MEQ/15ML (10%) PO LIQD
20.0000 meq | Freq: Two times a day (BID) | ORAL | Status: DC
  Filled 2012-08-19: qty 30

## 2012-08-19 MED ORDER — MAGNESIUM OXIDE 400 (241.3 MG) MG PO TABS
400.0000 mg | ORAL_TABLET | Freq: Two times a day (BID) | ORAL | Status: DC
  Administered 2012-08-19 – 2012-09-07 (×39): 400 mg via NASOGASTRIC
  Filled 2012-08-19 (×42): qty 1

## 2012-08-19 MED ORDER — POTASSIUM CHLORIDE 20 MEQ/15ML (10%) PO LIQD: 20.0000 meq | Freq: Every day | ORAL | Status: DC

## 2012-08-19 MED ORDER — MIDAZOLAM HCL 2 MG/2ML IJ SOLN: 2.0000 mg | INTRAMUSCULAR | Status: DC | PRN

## 2012-08-19 NOTE — Progress Notes (Signed)
Name: [name] [name] MRN: [id number] DOB: 01-05-1956    LOS: 6  Referring Provider:  [name] (cards) Reason for Referral:  Recurrent VT, hx small cell lung ca  PULMONARY / CRITICAL CARE MEDICINE  HPI:  56 yo male smoker admitted 08/13/2012 with syncope and sustained VT with STEMI.  He had emergent cath with PCI/stent and pacer.  Noted to have abnormal CXR with hx of SCLC (Tx 3 yrs ago) and PCCM consulted 9/23.  Developed respiratory distress with VDRF and found to have PNA and moderate Rt pleural effusion on CT chest.  Pleural fluid was consistent with empyema and chest tube placed. PMHx systolic CHF (EF 40%)  Lines/tubes: ETT 9/24>> Rt IJ CVL 9/24>> Rt chest tube 9/24>>  Cultures: MRSA PCR 9/24 - NEGATIVE Sputum 9/24>>Few S aureus (? MSSA v MRSA) Blood 9/24>>Coag neg Staph Rt pleural fluid 9/24>> Urine 9/24>>negative     Antiotics: Vancomycin 9/24>> Zosyn 9/24>>  Tests/events: 9//23 Echo>>EF 25 to 30% 9/24 VDRF, pulmonary infiltrates, pleural effusion, chest tube 9/24 Rt pleural fluid>>protein 2.9, LDH 124, glucose 153, WBC 1452 (3N, 89L, 36M, 0E).  Cytology negative for malignancy 9/24 CT chest>>patchy perihilar ASD b/l, moderate right pleural effusion 9/27 - pacer sheath removed  Subjective: On sedatin gtt -> Normal WUA. Wants extubation. RASS 0. Moves all 4s. CAM-ICU negative for delirium Off pressors Doing SBT   Vital Signs: Temp:  [98.6 F (37 C)-100.3 F (37.9 C)] 98.6 F (37 C) (09/28 0700) Pulse Rate:  [64-83] 82  (09/28 0750) Resp:  [12-30] 24  (09/28 0750) BP: (81-109)/(49-64) 82/49 mmHg (09/28 0750) SpO2:  [95 %-100 %] 99 % (09/28 0750) FiO2 (%):  [30 %] 30 % (09/28 0750) Weight:  [109.2 kg (240 lb 11.9 oz)] 109.2 kg (240 lb 11.9 oz) (09/28 0500)  I/O last 3 completed shifts: In: 4203.4 [I.V.:1870.9; NG/GT:1335; IV Piggyback:997.5] Out: 4210 [Urine:3515; Chest Tube:695]  CVP 9  Physical Examination: General - no distress HEENT - ETT, OG tube in  place Cardiac - s2 s2 regular Chest - no wheeze, Rt chest tube in place Abd - soft, non tender Ext - no edema Neuro - On sedatin gtt -> Normal WUA. Wants extubation. RASS 0. Moves all 4s. CAM-ICU negative for delirium Dg Chest Port 1 View  08/18/2012  *RADIOLOGY REPORT*  Clinical Data: Assess endotracheal tube.  PORTABLE CHEST - 1 VIEW  Comparison: 08/17/2012.  Findings: Endotracheal tube tip is 14 mm from the carina, similar yesterday's exam.  Right thoracostomy tube is present.  Tiny right apical pneumothorax with pleural line at the apex.  Right IJ central line appears similar.  Enteric tube also appears similar. Pulmonary aeration appears little changed.  Left pleural effusion and basilar atelectasis.  There may be left basilar airspace disease as well.  IMPRESSION:  1.  Stable appearance of support apparatus. 2.  No interval change in pulmonary aeration.   Original Report Authenticated By: Andreas Newport, M.D.     BMET Lab Results  Component Value Date   CREATININE 0.67 08/19/2012   BUN 12 08/19/2012   NA 132* 08/19/2012   K 3.8 08/19/2012   CL 93* 08/19/2012   CO2 31 08/19/2012    CBC Lab Results  Component Value Date   WBC 11.1* 08/18/2012   HGB 12.6* 08/18/2012   HCT 36.6* 08/18/2012   MCV 90.4 08/18/2012   PLT 156 08/18/2012   Lab Results  Component Value Date   ALT 8 08/17/2012   AST 12 08/17/2012   ALKPHOS 42  08/17/2012   BILITOT 0.6 08/17/2012   BNP    Component Value Date/Time   PROBNP 2574.0* 08/17/2012 0445   ABG    Component Value Date/Time   PHART 7.498* 08/17/2012 0447   PCO2ART 36.4 08/17/2012 0447   PO2ART 125.0* 08/17/2012 0447   HCO3 28.1* 08/17/2012 0447   TCO2 29 08/17/2012 0447   O2SAT 99.0 08/17/2012 0447   CBG (last 3)   Basename 08/19/12 0742 08/19/12 0407 08/18/12 2359  GLUCAP 129* 122* 114*     ASSESSMENT AND PLAN  A: Acute respiratory failure with pulmonary infiltrates, and right pleural effusion.  Rt pleural effusion noted to appear purulent, but  numbers show transudate with predominance of lymphocytes, and cytology negative for malignancy.  Hx of SCLC.  ?PNA vs recurrence of maligancy. - pn 08/19/2012: Doing SBT with normal WUA but on sedation gtt  P: Change to prn sedation and if does well on SBT -> extubate Continue chest tube -10 suction>>likely need to keep in until off vent D 5/x vancomycin, zosyn Will likely need bronchoscopy when more stable f/u pleural fluid for ADA  A: Shock ?cardiogenic vs septic.  - on 08/19/2012 L off pressors  P: Goal  MAP > 65 Goal CVP > 6  A: Recurrent VT with STEMI.  Systolic CHF.  - on 08/19/2012: no arrhthmias . On amio gtt. Cards following  P: Per cardiology and EP cardiology  A: RENAL and lytes  Lab 08/19/12 0500 08/18/12 0440 08/17/12 0445 08/16/12 0410 08/15/12 0445 08/13/12 1324  NA 132* 132* 133* 130* 133* --  K 3.8 4.3 -- -- -- --  CL 93* 97 97 96 97 --  CO2 31 27 27 26 23  --  GLUCOSE 98 152* 155* 115* 149* --  BUN 12 11 10 14 11  --  CREATININE 0.67 0.68 0.73 0.95 0.76 --  CALCIUM 8.5 8.4 8.0* 8.0* 8.3* --  MG -- 1.9 -- 1.9 2.1 1.8  PHOS -- -- -- 1.6* -- 2.1*     on 9/28: Mild low k. P: Goal K > 4; so replete Goal mag > 2; rechckec f/u BMET  A: Hyperglycemia. P: SSI  A: Nutrition. P: Tube feeds while on vent  Best Practice DVT >> SCD's SUP >> Protonix HOB > 30 degrees   Updated wife at bedside.  Explained that working dx for lung infiltrates is pneumonia, but that there is still concern for possible malignancy.  If pulmonary infiltrates don't improve, then he may need bronchoscopy.    The patient is critically ill with multiple organ systems failure and requires high complexity decision making for assessment and support, frequent evaluation and titration of therapies, application of advanced monitoring technologies and extensive interpretation of multiple databases.   Critical Care Time devoted to patient care services described in this note is  35   Minutes.  Dr. Kalman Shan, M.D., [facility].C.P Pulmonary and Critical Care Medicine Staff Physician [facility] [facility] Pager: [phone], If no answer or between  15:00h - 7:00h: call [phone]  08/19/2012 9:11 AM

## 2012-08-19 NOTE — Progress Notes (Signed)
Pt extubated as per MD order at 1625 to 4lpm nasal cannula. Pt suctioned prior to extubation.  Hr 72, rr 22, sats 100%.  Pt tolerated extubation well with pt vocalizing post extubation.  No complications noted.  Will monitor progress. S Cage Gupton rrt, rcp

## 2012-08-19 NOTE — Progress Notes (Addendum)
Versed 25cc and fentanyl 50cc wasted in sink.

## 2012-08-19 NOTE — Progress Notes (Signed)
ANTIBIOTIC CONSULT NOTE - FOLLOW UP  Pharmacy Consult for vancomycin Indication: pneumonia  Labs:  Basename 08/19/12 0500 08/18/12 0440 08/17/12 0445 08/16/12 0845  WBC -- 11.1* -- 12.5*  HGB -- 12.6* -- 12.4*  PLT -- 156 -- 138*  LABCREA -- -- -- --  CREATININE 0.67 0.68 0.73 --   Estimated Creatinine Clearance: 134.7 ml/min (by C-G formula based on Cr of 0.67).  Basename 08/19/12 0500 08/18/12 1800 08/17/12 1230  VANCOTROUGH -- 107.4* 5.8*  VANCOPEAK -- -- --  VANCORANDOM 11.6 -- --  GENTTROUGH -- -- --  GENTPEAK -- -- --  GENTRANDOM -- -- --  TOBRATROUGH -- -- --  TOBRAPEAK -- -- --  TOBRARND -- -- --  AMIKACINPEAK -- -- --  AMIKACINTROU -- -- --  AMIKACIN -- -- --     Assessment: 56yo male remains subtherapeutic on vancomycin after dose change for PNA (last pm's vanc trough at 107 was apparently an error).  Goal of Therapy:  Vancomycin trough level 15-20 mcg/ml  Plan:  Will increase vancomycin dose to 2000mg  IV Q8H for calculated trough of ~17 and check trough at steady state.  Colleen Can PharmD BCPS 08/19/2012,6:36 AM

## 2012-08-19 NOTE — Significant Event (Signed)
Looks fantastic Tolerated SBT well. F/Vt < 100 Fully awake.  Plan Extubate to n/c Sedation adjusted for analgesia goal only   Anders Simmonds ACNP-BC [facility] Pager # [phone] OR # [phone] if no answer

## 2012-08-19 NOTE — Progress Notes (Signed)
[facility]  DAILY PROGRESS NOTE   Subjective:  Awake, alert, calm, communicates with gestures.  Objective:  Temp:  [98.6 F (37 C)-100.3 F (37.9 C)] 98.6 F (37 C) (09/28 0700) Pulse Rate:  [64-83] 82  (09/28 0750) Resp:  [12-30] 24  (09/28 0750) BP: (81-109)/(49-64) 82/49 mmHg (09/28 0750) SpO2:  [95 %-100 %] 99 % (09/28 0750) FiO2 (%):  [30 %] 30 % (09/28 0750) Weight:  [109.2 kg (240 lb 11.9 oz)] 109.2 kg (240 lb 11.9 oz) (09/28 0500) Weight change: -0.6 kg (-1 lb 5.2 oz)  Intake/Output from previous day: 09/27 0701 - 09/28 0700 In: 2279 [I.V.:679; NG/GT:940; IV Piggyback:660] Out: 2625 [Urine:2300; Chest Tube:325]  Intake/Output from this shift:    Medications: Current Facility-Administered Medications  Medication Dose Route Frequency Provider Last Rate Last Dose  . 0.9 %  sodium chloride infusion   Intravenous Continuous Liem Copenhaver, MD      . acetaminophen (TYLENOL) tablet 650 mg  650 mg Oral Q4H PRN Lennette Bihari, MD      . amiodarone (NEXTERONE PREMIX) 360 mg/200 mL dextrose IV infusion  30 mg/hr Intravenous Continuous Kasaundra Fahrney, MD 16.7 mL/hr at 08/19/12 0653 30 mg/hr at 08/19/12 0653  . antiseptic oral rinse (BIOTENE) solution 15 mL  15 mL Mouth Rinse QID Lennette Bihari, MD   15 mL at 08/19/12 0400  . aspirin EC tablet 81 mg  81 mg Oral Daily Lennette Bihari, MD   81 mg at 08/18/12 1009  . chlorhexidine (PERIDEX) 0.12 % solution 15 mL  15 mL Mouth Rinse BID Lennette Bihari, MD   15 mL at 08/18/12 1942  . feeding supplement (PRO-STAT SUGAR FREE 64) liquid 30 mL  30 mL Per Tube TID Tonye Becket, RD   30 mL at 08/18/12 2215  . feeding supplement (PROMOTE) liquid 1,000 mL  1,000 mL Per Tube Continuous Tonye Becket, RD 50 mL/hr at 08/18/12 1220 1,000 mL at 08/18/12 1220  . fentaNYL (SUBLIMAZE) 10 mcg/mL in sodium chloride 0.9 % 250 mL infusion  50-400 mcg/hr Intravenous Titrated Alyson Reedy, MD 5 mL/hr at 08/17/12 1100 50  mcg/hr at 08/17/12 1100   And  . fentaNYL (SUBLIMAZE) bolus via infusion 50-100 mcg  50-100 mcg Intravenous Q6H PRN Alyson Reedy, MD      . furosemide (LASIX) injection 20 mg  20 mg Intravenous Once Dayanna Pryce, MD      . insulin aspart (novoLOG) injection 0-20 Units  0-20 Units Subcutaneous Q4H Coralyn Helling, MD   3 Units at 08/19/12 0407  . levalbuterol (XOPENEX) nebulizer solution 0.63 mg  0.63 mg Nebulization Q6H PRN Abelino Derrick, PA   0.63 mg at 08/15/12 1008  . magnesium oxide (MAG-OX) tablet 400 mg  400 mg Per NG tube Daily Toshie Demelo, MD      . midazolam (VERSED) 1 mg/mL in sodium chloride 0.9 % 50 mL infusion  2-10 mg/hr Intravenous Titrated Alyson Reedy, MD 2 mL/hr at 08/18/12 2300 2 mg/hr at 08/18/12 2300   And  . midazolam (VERSED) bolus via infusion 1-2 mg  1-2 mg Intravenous Q2H PRN Alyson Reedy, MD      . multivitamin liquid 5 mL  5 mL Per Tube Daily Tonye Becket, RD   5 mL at 08/18/12 1009  . norepinephrine (LEVOPHED) 4 mg in dextrose 5 % 250 mL infusion  2-50 mcg/min Intravenous Titrated Alyson Reedy, MD   1  mcg/min at 08/18/12 0900  . ondansetron (ZOFRAN) injection 4 mg  4 mg Intravenous Q4H PRN Chrystie Nose, MD   4 mg at 08/15/12 0207  . pantoprazole sodium (PROTONIX) 40 mg/20 mL oral suspension 40 mg  40 mg Per Tube Q24H Coralyn Helling, MD   40 mg at 08/18/12 1201  . piperacillin-tazobactam (ZOSYN) IVPB 3.375 g  3.375 g Intravenous Q8H Benny Lennert, PHARMD   3.375 g at 08/19/12 0400  . potassium chloride 20 MEQ/15ML (10%) liquid 20 mEq  20 mEq Oral Daily Laquanna Veazey, MD      . Ticagrelor (BRILINTA) tablet 90 mg  90 mg Oral BID Lennette Bihari, MD   90 mg at 08/18/12 2215  . vancomycin (VANCOCIN) 2,000 mg in sodium chloride 0.9 % 500 mL IVPB  2,000 mg Intravenous Q8H Colleen Can, PHARMD   2,000 mg at 08/19/12 0653  . DISCONTD: feeding supplement (OSMOLITE 1.2 CAL) liquid 1,000 mL  1,000 mL Per Tube Q24H Tonye Becket, RD 30 mL/hr at 08/17/12  1339 1,000 mL at 08/17/12 1339  . DISCONTD: feeding supplement (PRO-STAT SUGAR FREE 64) liquid 30 mL  30 mL Oral Q1200 Tonye Becket, RD      . DISCONTD: feeding supplement (PRO-STAT SUGAR FREE 64) liquid 60 mL  60 mL Per Tube QID Tonye Becket, RD   60 mL at 08/17/12 2237  . DISCONTD: feeding supplement (PRO-STAT SUGAR FREE 64) liquid 60 mL  60 mL Per Tube TID Tonye Becket, RD      . DISCONTD: lidocaine (cardiac) IV  infusion 4 mg/mL  1 mg/min Intravenous Titrated Marinus Maw, MD 15 mL/hr at 08/17/12 0500 1 mg/min at 08/17/12 0500  . DISCONTD: vancomycin (VANCOCIN) 1,250 mg in sodium chloride 0.9 % 250 mL IVPB  1,250 mg Intravenous Q8H Tsz-Yin So, PHARMD   1,250 mg at 08/18/12 1747    Physical Exam: General appearance: alert, cooperative and no distress Neck: no adenopathy, no carotid bruit, no JVD, supple, symmetrical, trachea midline and thyroid not enlarged, symmetric, no tenderness/mass/nodules Lungs: rales bilaterally and rhonchi scattered, occasional Heart: regular rate and rhythm, S1, S2 normal, no murmur, click, rub or gallop and frequent PVCs, sometimes in bigeminy, occasional 3-4 beat runs of poymorphic VT Abdomen: soft, non-tender; bowel sounds normal; no masses,  no organomegaly Extremities: extremities normal, atraumatic, no cyanosis or edema Pulses: 2+ and symmetric Skin: Skin color, texture, turgor normal. No rashes or lesions Neurologic: Grossly normal  Lab Results: Results for orders placed during the hospital encounter of 08/13/12 (from the past 48 hour(s))  GLUCOSE, CAPILLARY     Status: Abnormal   Collection Time   08/17/12 12:28 PM      Component Value Range Comment   Glucose-Capillary 113 (*) 70 - 99 mg/dL   VANCOMYCIN, TROUGH     Status: Abnormal   Collection Time   08/17/12 12:30 PM      Component Value Range Comment   Vancomycin Tr 5.8 (*) 10.0 - 20.0 ug/mL   GLUCOSE, CAPILLARY     Status: Abnormal   Collection Time   08/17/12  5:03 PM       Component Value Range Comment   Glucose-Capillary 135 (*) 70 - 99 mg/dL   GLUCOSE, CAPILLARY     Status: Abnormal   Collection Time   08/17/12  8:14 PM      Component Value Range Comment   Glucose-Capillary 123 (*) 70 - 99 mg/dL   GLUCOSE, CAPILLARY  Status: Abnormal   Collection Time   08/17/12 11:03 PM      Component Value Range Comment   Glucose-Capillary 136 (*) 70 - 99 mg/dL   GLUCOSE, CAPILLARY     Status: Abnormal   Collection Time   08/18/12  4:39 AM      Component Value Range Comment   Glucose-Capillary 156 (*) 70 - 99 mg/dL   BASIC METABOLIC PANEL     Status: Abnormal   Collection Time   08/18/12  4:40 AM      Component Value Range Comment   Sodium 132 (*) 135 - 145 mEq/L    Potassium 4.3  3.5 - 5.1 mEq/L    Chloride 97  96 - 112 mEq/L    CO2 27  19 - 32 mEq/L    Glucose, Bld 152 (*) 70 - 99 mg/dL    BUN 11  6 - 23 mg/dL    Creatinine, Ser 1.91  0.50 - 1.35 mg/dL    Calcium 8.4  8.4 - 47.8 mg/dL    GFR calc non Af Amer >90  >90 mL/min    GFR calc Af Amer >90  >90 mL/min   MAGNESIUM     Status: Normal   Collection Time   08/18/12  4:40 AM      Component Value Range Comment   Magnesium 1.9  1.5 - 2.5 mg/dL   CBC     Status: Abnormal   Collection Time   08/18/12  4:40 AM      Component Value Range Comment   WBC 11.1 (*) 4.0 - 10.5 K/uL    RBC 4.05 (*) 4.22 - 5.81 MIL/uL    Hemoglobin 12.6 (*) 13.0 - 17.0 g/dL    HCT 29.5 (*) 62.1 - 52.0 %    MCV 90.4  78.0 - 100.0 fL    MCH 31.1  26.0 - 34.0 pg    MCHC 34.4  30.0 - 36.0 g/dL    RDW 30.8  65.7 - 84.6 %    Platelets 156  150 - 400 K/uL   GLUCOSE, CAPILLARY     Status: Abnormal   Collection Time   08/18/12  7:47 AM      Component Value Range Comment   Glucose-Capillary 140 (*) 70 - 99 mg/dL   GLUCOSE, CAPILLARY     Status: Abnormal   Collection Time   08/18/12 11:57 AM      Component Value Range Comment   Glucose-Capillary 106 (*) 70 - 99 mg/dL   GLUCOSE, CAPILLARY     Status: Abnormal   Collection Time    08/18/12  4:58 PM      Component Value Range Comment   Glucose-Capillary 127 (*) 70 - 99 mg/dL   VANCOMYCIN, TROUGH     Status: Abnormal   Collection Time   08/18/12  6:00 PM      Component Value Range Comment   Vancomycin Tr 107.4 (*) 10.0 - 20.0 ug/mL   GLUCOSE, CAPILLARY     Status: Normal   Collection Time   08/18/12  7:35 PM      Component Value Range Comment   Glucose-Capillary 99  70 - 99 mg/dL   GLUCOSE, CAPILLARY     Status: Abnormal   Collection Time   08/18/12 11:59 PM      Component Value Range Comment   Glucose-Capillary 114 (*) 70 - 99 mg/dL   GLUCOSE, CAPILLARY     Status: Abnormal   Collection Time   08/19/12  4:07 AM      Component Value Range Comment   Glucose-Capillary 122 (*) 70 - 99 mg/dL   BASIC METABOLIC PANEL     Status: Abnormal   Collection Time   08/19/12  5:00 AM      Component Value Range Comment   Sodium 132 (*) 135 - 145 mEq/L    Potassium 3.8  3.5 - 5.1 mEq/L    Chloride 93 (*) 96 - 112 mEq/L    CO2 31  19 - 32 mEq/L    Glucose, Bld 98  70 - 99 mg/dL    BUN 12  6 - 23 mg/dL    Creatinine, Ser 1.61  0.50 - 1.35 mg/dL    Calcium 8.5  8.4 - 09.6 mg/dL    GFR calc non Af Amer >90  >90 mL/min    GFR calc Af Amer >90  >90 mL/min   VANCOMYCIN, RANDOM     Status: Normal   Collection Time   08/19/12  5:00 AM      Component Value Range Comment   Vancomycin Rm 11.6       Imaging: Dg Chest Port 1 View  08/18/2012  *RADIOLOGY REPORT*  Clinical Data: Assess endotracheal tube.  PORTABLE CHEST - 1 VIEW  Comparison: 08/17/2012.  Findings: Endotracheal tube tip is 14 mm from the carina, similar yesterday's exam.  Right thoracostomy tube is present.  Tiny right apical pneumothorax with pleural line at the apex.  Right IJ central line appears similar.  Enteric tube also appears similar. Pulmonary aeration appears little changed.  Left pleural effusion and basilar atelectasis.  There may be left basilar airspace disease as well.  IMPRESSION:  1.  Stable appearance of  support apparatus. 2.  No interval change in pulmonary aeration.   Original Report Authenticated By: Andreas Newport, M.D.     Assessment:  1. Principal Problem: 2.  *Sustained ventricular tachycardia with associated syncope; Monomorphic 3. Active Problems: 4.  Hx of cancer of lung, 3 years ago treated with radiation and chemo and resolved 5.  GERD (gastroesophageal reflux disease) 6.  Hyperglycemia, pt has been on meds for diabetes in the past 7.  Cardiomyopathy, ischemic, by cardiac cath 30-35% 08/13/12  8.  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  S/P coronary artery stent placement, acutely to RCA for acute Post. MI with BMS, 08/13/12 10.  Pacemaker, temporary placed 08/13/12 11.  Acute respiratory failure 12.  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 13.  Pleural effusion, intubated, chest tube place 08/16/12 14.  Hypotension, on pressors 15.  Hypokalemia 16.   Plan:  1. Making good progress, slowly. Clearly hypervolemic, albeit with excellent oxygenation on vent. Needs cautious diuresis to avoid worsening hypotension or electrolyte imbalances 2. He has frequent and very premature monomorphic PVCs, often "R on T", occasionally triggering very brief PMVT, especially on postextrasystolic beats with longer QT.  More amiodarone might reduce PVC frequency, but this might also lengthen QT and become proarrhythmic.  Keep K around 4 or higher, give extra magnesium. If arrhythmia becomes more complex, may need to restart iv lidocaine. Long term consider combination PO amiodarone and mexiletine. If he will get ICD (premature to discuss now) he should get a dual chamber device that will allow higher rate atrial pacing.  Time Spent Directly with Patient:  45 minutes  Length of Stay:  LOS: 6 days    [name] [name] 08/19/2012, 8:22 AM

## 2012-08-20 ENCOUNTER — Inpatient Hospital Stay (HOSPITAL_COMMUNITY): Payer: BC Managed Care – PPO

## 2012-08-20 DIAGNOSIS — R7309 Other abnormal glucose: Secondary | ICD-10-CM

## 2012-08-20 LAB — GLUCOSE, CAPILLARY
Glucose-Capillary: 102 mg/dL — ABNORMAL HIGH (ref 70–99)
Glucose-Capillary: 103 mg/dL — ABNORMAL HIGH (ref 70–99)
Glucose-Capillary: 106 mg/dL — ABNORMAL HIGH (ref 70–99)
Glucose-Capillary: 110 mg/dL — ABNORMAL HIGH (ref 70–99)

## 2012-08-20 LAB — BASIC METABOLIC PANEL
Calcium: 8.4 mg/dL (ref 8.4–10.5)
GFR calc Af Amer: >90 mL/min (ref >90)
GFR calc non Af Amer: >90 mL/min (ref >90)
Sodium: 131 mEq/L — ABNORMAL LOW (ref 135–145)

## 2012-08-20 LAB — BODY FLUID CELL COUNT WITH DIFFERENTIAL
Lymphs, Fluid: 85 %
Neutrophil Count, Fluid: 7 % (ref 0–25)

## 2012-08-20 LAB — MAGNESIUM: Magnesium: 2.4 mg/dL (ref 1.5–2.5)

## 2012-08-20 LAB — CHOLESTEROL, BODY FLUID

## 2012-08-20 LAB — CBC
MCH: 31.3 pg (ref 26.0–34.0)
MCHC: 34.4 g/dL (ref 30.0–36.0)
Platelets: 195 10*3/uL (ref 150–400)
RBC: 3.87 MIL/uL — ABNORMAL LOW (ref 4.22–5.81)

## 2012-08-20 MED ORDER — PNEUMOCOCCAL VAC POLYVALENT 25 MCG/0.5ML IJ INJ
0.5000 mL | INJECTION | INTRAMUSCULAR | Status: AC
  Administered 2012-08-21: 0.5 mL via INTRAMUSCULAR
  Filled 2012-08-20 (×2): qty 0.5

## 2012-08-20 MED ORDER — CEFAZOLIN SODIUM 1-5 GM-% IV SOLN
1.0000 g | Freq: Three times a day (TID) | INTRAVENOUS | Status: AC
  Administered 2012-08-20 – 2012-08-23 (×9): 1 g via INTRAVENOUS
  Filled 2012-08-20 (×9): qty 50

## 2012-08-20 MED ORDER — INSULIN ASPART 100 UNIT/ML ~~LOC~~ SOLN
0.0000 [IU] | Freq: Three times a day (TID) | SUBCUTANEOUS | Status: DC
  Administered 2012-08-21 – 2012-08-23 (×4): 2 [IU] via SUBCUTANEOUS

## 2012-08-20 MED ORDER — INSULIN ASPART 100 UNIT/ML ~~LOC~~ SOLN: 0.0000 [IU] | Freq: Every day | SUBCUTANEOUS | Status: DC

## 2012-08-20 MED ORDER — ADULT MULTIVITAMIN W/MINERALS CH
1.0000 | ORAL_TABLET | Freq: Every day | ORAL | Status: DC
  Administered 2012-08-20 – 2012-09-07 (×19): 1 via ORAL
  Filled 2012-08-20 (×19): qty 1

## 2012-08-20 MED ORDER — INFLUENZA VIRUS VACC SPLIT PF IM SUSP
0.5000 mL | INTRAMUSCULAR | Status: AC
  Administered 2012-08-21: 0.5 mL via INTRAMUSCULAR
  Filled 2012-08-20: qty 0.5

## 2012-08-20 MED ORDER — POTASSIUM CHLORIDE CRYS ER 20 MEQ PO TBCR
20.0000 meq | EXTENDED_RELEASE_TABLET | Freq: Two times a day (BID) | ORAL | Status: DC
  Administered 2012-08-20 (×2): 20 meq via ORAL
  Filled 2012-08-20 (×4): qty 1

## 2012-08-20 MED ORDER — LISINOPRIL 2.5 MG PO TABS
2.5000 mg | ORAL_TABLET | Freq: Every day | ORAL | Status: DC
  Administered 2012-08-20 – 2012-08-21 (×2): 2.5 mg via ORAL
  Filled 2012-08-20 (×3): qty 1

## 2012-08-20 MED ORDER — SODIUM CHLORIDE 0.9 % IV SOLN
INTRAVENOUS | Status: DC
  Administered 2012-08-20: 12:00:00 via INTRAVENOUS
  Administered 2012-08-21: 500 mL via INTRAVENOUS

## 2012-08-20 MED ORDER — AMIODARONE HCL 200 MG PO TABS
400.0000 mg | ORAL_TABLET | Freq: Every day | ORAL | Status: DC
  Administered 2012-08-20 – 2012-08-28 (×9): 400 mg via ORAL
  Filled 2012-08-20 (×9): qty 2

## 2012-08-20 MED ORDER — BIOTENE DRY MOUTH MT LIQD
15.0000 mL | Freq: Two times a day (BID) | OROMUCOSAL | Status: DC
  Administered 2012-08-20 – 2012-08-21 (×2): 15 mL via OROMUCOSAL

## 2012-08-20 NOTE — Progress Notes (Signed)
[facility]  DAILY PROGRESS NOTE   Subjective:  Extubated, calm, alert and fully oriented. Pleuritic chest discomfort at right chest tube site. No dyspnea. Continues to have relatively frequent, very premature PVCs with fixed coupling interval and R-on-T appearance, but overall burden appears to be lower and no runs seen today.  Objective:  Temp:  [98 F (36.7 C)-98.5 F (36.9 C)] 98.3 F (36.8 C) (09/29 0400) Pulse Rate:  [64-76] 68  (09/29 0700) Resp:  [15-35] 19  (09/29 0700) BP: (86-117)/(45-69) 99/58 mmHg (09/29 0700) SpO2:  [95 %-100 %] 99 % (09/29 0700) FiO2 (%):  [30 %] 30 % (09/28 1600) Weight:  [106.8 kg (235 lb 7.2 oz)] 106.8 kg (235 lb 7.2 oz) (09/29 0500) Weight change: -2.4 kg (-5 lb 4.7 oz)  Intake/Output from previous day: 09/28 0701 - 09/29 0700 In: 2985.3 [P.O.:120; I.V.:597.8; NG/GT:630; IV Piggyback:1637.5] Out: 5030 [Urine:4410; Stool:100; Chest Tube:520]  Intake/Output from this shift:    Medications: Current Facility-Administered Medications  Medication Dose Route Frequency Provider Last Rate Last Dose  . 0.9 %  sodium chloride infusion   Intravenous Continuous Thurmon Fair, MD 10 mL/hr at 08/19/12 1258    . acetaminophen (TYLENOL) tablet 650 mg  650 mg Oral Q4H PRN Lennette Bihari, MD   650 mg at 08/20/12 0227  . amiodarone (PACERONE) tablet 400 mg  400 mg Oral Daily Alyshia Kernan, MD      . antiseptic oral rinse (BIOTENE) solution 15 mL  15 mL Mouth Rinse QID Lennette Bihari, MD   15 mL at 08/20/12 0412  . aspirin EC tablet 81 mg  81 mg Oral Daily Lennette Bihari, MD   81 mg at 08/19/12 1101  . chlorhexidine (PERIDEX) 0.12 % solution 15 mL  15 mL Mouth Rinse BID Lennette Bihari, MD   15 mL at 08/19/12 2101  . fentaNYL (SUBLIMAZE) injection 50 mcg  50 mcg Intravenous Q2H PRN Simonne Martinet, NP   50 mcg at 08/20/12 0421  . furosemide (LASIX) injection 20 mg  20 mg Intravenous Once Thurmon Fair, MD   20 mg at 08/19/12 0843   . insulin aspart (novoLOG) injection 0-20 Units  0-20 Units Subcutaneous Q4H Coralyn Helling, MD   3 Units at 08/19/12 1315  . levalbuterol (XOPENEX) nebulizer solution 0.63 mg  0.63 mg Nebulization Q6H PRN Abelino Derrick, PA   0.63 mg at 08/15/12 1008  . lisinopril (PRINIVIL,ZESTRIL) tablet 2.5 mg  2.5 mg Oral Daily Kyera Felan, MD      . magnesium oxide (MAG-OX) tablet 400 mg  400 mg Per NG tube BID Thurmon Fair, MD   400 mg at 08/19/12 2305  . magnesium sulfate IVPB 2 g 50 mL  2 g Intravenous Once Thurmon Fair, MD   2 g at 08/19/12 1023  . multivitamin liquid 5 mL  5 mL Per Tube Daily Tonye Becket, RD   5 mL at 08/19/12 1102  . ondansetron (ZOFRAN) injection 4 mg  4 mg Intravenous Q4H PRN Chrystie Nose, MD   4 mg at 08/15/12 0207  . piperacillin-tazobactam (ZOSYN) IVPB 3.375 g  3.375 g Intravenous Q8H Benny Lennert, PHARMD   3.375 g at 08/20/12 0430  . potassium chloride 20 MEQ/15ML (10%) liquid 20 mEq  20 mEq Oral BID Lennette Bihari, MD   20 mEq at 08/19/12 2311  . potassium chloride 20 MEQ/15ML (10%) liquid 40 mEq  40 mEq Per Tube Once [employer],  MD   40 mEq at 08/19/12 1101  . Ticagrelor (BRILINTA) tablet 90 mg  90 mg Oral BID Lennette Bihari, MD   90 mg at 08/19/12 2309  . vancomycin (VANCOCIN) 2,000 mg in sodium chloride 0.9 % 500 mL IVPB  2,000 mg Intravenous Q8H Colleen Can, PHARMD   2,000 mg at 08/20/12 0557  . DISCONTD: amiodarone (NEXTERONE PREMIX) 360 mg/200 mL dextrose IV infusion  30 mg/hr Intravenous Continuous Anshul Meddings, MD 16.7 mL/hr at 08/20/12 0557 30 mg/hr at 08/20/12 0557  . DISCONTD: feeding supplement (PRO-STAT SUGAR FREE 64) liquid 30 mL  30 mL Per Tube TID Tonye Becket, RD   30 mL at 08/19/12 1101  . DISCONTD: feeding supplement (PROMOTE) liquid 1,000 mL  1,000 mL Per Tube Continuous Tonye Becket, RD 50 mL/hr at 08/19/12 1440 1,000 mL at 08/19/12 1440  . DISCONTD: fentaNYL (SUBLIMAZE) 10 mcg/mL in sodium chloride 0.9 % 250 mL  infusion  50-400 mcg/hr Intravenous Titrated Alyson Reedy, MD 5 mL/hr at 08/17/12 1100 50 mcg/hr at 08/17/12 1100  . DISCONTD: fentaNYL (SUBLIMAZE) bolus via infusion 50-100 mcg  50-100 mcg Intravenous Q6H PRN Alyson Reedy, MD      . DISCONTD: fentaNYL (SUBLIMAZE) injection 50-200 mcg  50-200 mcg Intravenous Q2H PRN Kalman Shan, MD   200 mcg at 08/19/12 1111  . DISCONTD: magnesium oxide (MAG-OX) tablet 400 mg  400 mg Per NG tube Daily Dua Mehler, MD      . DISCONTD: midazolam (VERSED) 1 mg/mL in sodium chloride 0.9 % 50 mL infusion  2-10 mg/hr Intravenous Titrated Alyson Reedy, MD 2 mL/hr at 08/18/12 2300 2 mg/hr at 08/18/12 2300  . DISCONTD: midazolam (VERSED) bolus via infusion 1-2 mg  1-2 mg Intravenous Q2H PRN Alyson Reedy, MD      . DISCONTD: midazolam (VERSED) injection 2-4 mg  2-4 mg Intravenous Q2H PRN Kalman Shan, MD      . DISCONTD: norepinephrine (LEVOPHED) 4 mg in dextrose 5 % 250 mL infusion  2-50 mcg/min Intravenous Titrated Alyson Reedy, MD   1 mcg/min at 08/18/12 0900  . DISCONTD: pantoprazole sodium (PROTONIX) 40 mg/20 mL oral suspension 40 mg  40 mg Per Tube Q24H Coralyn Helling, MD   40 mg at 08/19/12 1256  . DISCONTD: potassium chloride 20 MEQ/15ML (10%) liquid 20 mEq  20 mEq Oral Daily Providencia Hottenstein, MD      . DISCONTD: potassium chloride 20 MEQ/15ML (10%) liquid 20 mEq  20 mEq Oral BID Thurmon Fair, MD        Physical Exam: General appearance: alert, cooperative and no distress Neck: no adenopathy, no carotid bruit, no JVD, supple, symmetrical, trachea midline and thyroid not enlarged, symmetric, no tenderness/mass/nodules Lungs: wheezes bilaterally Heart: regular rate and rhythm, S1, S2 normal, no murmur, click, rub or gallop Abdomen: soft, non-tender; bowel sounds normal; no masses,  no organomegaly Extremities: extremities normal, atraumatic, no cyanosis or edema Pulses: 2+ and symmetric Skin: Skin color, texture, turgor normal. No rashes or  lesions Neurologic: Alert and oriented X 3, normal strength and tone. Normal symmetric reflexes. Normal coordination and gait  Lab Results: Results for orders placed during the hospital encounter of 08/13/12 (from the past 48 hour(s))  GLUCOSE, CAPILLARY     Status: Abnormal   Collection Time   08/18/12 11:57 AM      Component Value Range Comment   Glucose-Capillary 106 (*) 70 - 99 mg/dL   GLUCOSE, CAPILLARY  Status: Abnormal   Collection Time   08/18/12  4:58 PM      Component Value Range Comment   Glucose-Capillary 127 (*) 70 - 99 mg/dL   VANCOMYCIN, TROUGH     Status: Abnormal   Collection Time   08/18/12  6:00 PM      Component Value Range Comment   Vancomycin Tr 107.4 (*) 10.0 - 20.0 ug/mL   GLUCOSE, CAPILLARY     Status: Normal   Collection Time   08/18/12  7:35 PM      Component Value Range Comment   Glucose-Capillary 99  70 - 99 mg/dL   GLUCOSE, CAPILLARY     Status: Abnormal   Collection Time   08/18/12 11:59 PM      Component Value Range Comment   Glucose-Capillary 114 (*) 70 - 99 mg/dL   GLUCOSE, CAPILLARY     Status: Abnormal   Collection Time   08/19/12  4:07 AM      Component Value Range Comment   Glucose-Capillary 122 (*) 70 - 99 mg/dL   BASIC METABOLIC PANEL     Status: Abnormal   Collection Time   08/19/12  5:00 AM      Component Value Range Comment   Sodium 132 (*) 135 - 145 mEq/L    Potassium 3.8  3.5 - 5.1 mEq/L    Chloride 93 (*) 96 - 112 mEq/L    CO2 31  19 - 32 mEq/L    Glucose, Bld 98  70 - 99 mg/dL    BUN 12  6 - 23 mg/dL    Creatinine, Ser 8.11  0.50 - 1.35 mg/dL    Calcium 8.5  8.4 - 91.4 mg/dL    GFR calc non Af Amer >90  >90 mL/min    GFR calc Af Amer >90  >90 mL/min   VANCOMYCIN, RANDOM     Status: Normal   Collection Time   08/19/12  5:00 AM      Component Value Range Comment   Vancomycin Rm 11.6     GLUCOSE, CAPILLARY     Status: Abnormal   Collection Time   08/19/12  7:42 AM      Component Value Range Comment   Glucose-Capillary 129  (*) 70 - 99 mg/dL   GLUCOSE, CAPILLARY     Status: Abnormal   Collection Time   08/19/12 12:56 PM      Component Value Range Comment   Glucose-Capillary 141 (*) 70 - 99 mg/dL   GLUCOSE, CAPILLARY     Status: Abnormal   Collection Time   08/19/12  4:18 PM      Component Value Range Comment   Glucose-Capillary 105 (*) 70 - 99 mg/dL   GLUCOSE, CAPILLARY     Status: Abnormal   Collection Time   08/19/12  7:40 PM      Component Value Range Comment   Glucose-Capillary 122 (*) 70 - 99 mg/dL    Comment 1 Notify RN      Comment 2 Documented in Chart     GLUCOSE, CAPILLARY     Status: Abnormal   Collection Time   08/20/12 12:34 AM      Component Value Range Comment   Glucose-Capillary 103 (*) 70 - 99 mg/dL    Comment 1 Notify RN      Comment 2 Documented in Chart     MAGNESIUM     Status: Normal   Collection Time   08/20/12  4:00 AM  Component Value Range Comment   Magnesium 2.4  1.5 - 2.5 mg/dL   CBC     Status: Abnormal   Collection Time   08/20/12  4:00 AM      Component Value Range Comment   WBC 7.8  4.0 - 10.5 K/uL    RBC 3.87 (*) 4.22 - 5.81 MIL/uL    Hemoglobin 12.1 (*) 13.0 - 17.0 g/dL    HCT 16.1 (*) 09.6 - 52.0 %    MCV 91.0  78.0 - 100.0 fL    MCH 31.3  26.0 - 34.0 pg    MCHC 34.4  30.0 - 36.0 g/dL    RDW 04.5  40.9 - 81.1 %    Platelets 195  150 - 400 K/uL   BASIC METABOLIC PANEL     Status: Abnormal   Collection Time   08/20/12  4:00 AM      Component Value Range Comment   Sodium 131 (*) 135 - 145 mEq/L    Potassium 4.3  3.5 - 5.1 mEq/L    Chloride 94 (*) 96 - 112 mEq/L    CO2 31  19 - 32 mEq/L    Glucose, Bld 107 (*) 70 - 99 mg/dL    BUN 12  6 - 23 mg/dL    Creatinine, Ser 9.14  0.50 - 1.35 mg/dL    Calcium 8.4  8.4 - 78.2 mg/dL    GFR calc non Af Amer >90  >90 mL/min    GFR calc Af Amer >90  >90 mL/min   PHOSPHORUS     Status: Normal   Collection Time   08/20/12  4:00 AM      Component Value Range Comment   Phosphorus 3.7  2.3 - 4.6 mg/dL   GLUCOSE,  CAPILLARY     Status: Abnormal   Collection Time   08/20/12  4:04 AM      Component Value Range Comment   Glucose-Capillary 106 (*) 70 - 99 mg/dL    Comment 1 Notify RN      Comment 2 Documented in Chart       Imaging: Dg Chest Port 1 View  08/19/2012  *RADIOLOGY REPORT*  Clinical Data: Follow up of "pulmonary infiltrates" and pleural effusion.  PORTABLE CHEST - 1 VIEW  Comparison: 1 day prior  Findings: Endotracheal tube terminates 1.5 cm above carina. Nasogastric tube poorly visualized distally but likely extends beyond the  inferior aspect of the film.  Right IJ central line unchanged.  Right-sided chest tube again identified.  The tiny right apical pneumothorax is again identified, at less than 5%.  Normal heart size.  No pleural fluid.  Similar mild left base air space disease.  IMPRESSION:  1. No significant change since one day prior. 2.  Similar left base atelectasis or infection. 3.  Right-sided chest tube remains in place with left than 5% right apical pneumothorax again identified.   Original Report Authenticated By: Consuello Bossier, M.D.     Assessment:  1. Principal Problem: 2.  *Sustained ventricular tachycardia with associated syncope; Monomorphic 3. Active Problems: 4.  Hx of cancer of lung, 3 years ago treated with radiation and chemo and resolved 5.  GERD (gastroesophageal reflux disease) 6.  Hyperglycemia, pt has been on meds for diabetes in the past 7.  Cardiomyopathy, ischemic, by cardiac cath 30-35% 08/13/12  8.  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  S/P coronary artery stent placement, acutely to RCA for acute Post. MI with BMS,  08/13/12 10.  Pacemaker, temporary placed 08/13/12 11.  Acute respiratory failure 12.  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 13.  Pleural effusion, intubated, chest tube place 08/16/12 14.  Hypotension, on pressors 15.  Hypokalemia 16.   Plan:  1. Switch to PO amiodarone 2. Decision re: AICD to be discussed  with EP consultant. Would technically wait for 90 days after PCI, with LifeVest transition, but need to keep in mind a couple of modifying factors. 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 He has a history of small cell Ca of the lung and despite being 3 years post end of therapy, his pleural effusion is concerning for neoplasm recurrence. 3. PT (once CT out). He is severely deconditioned.  Time Spent Directly with Patient:  30 minutes  Length of Stay:  LOS: 7 days    Cj Beecher 08/20/2012, 8:20 AM

## 2012-08-20 NOTE — Progress Notes (Addendum)
Name: [name] [name] MRN: [id number] DOB: 1956/01/26    LOS: 7  Referring Provider:  [name] (cards) Reason for Referral:  Recurrent VT, hx small cell lung ca  PULMONARY / CRITICAL CARE MEDICINE  HPI:  56 yo male smoker admitted 08/13/2012 with syncope and sustained VT with STEMI.  He had emergent cath with PCI/stent and pacer.  Noted to have abnormal CXR with hx of SCLC (Tx 3 yrs ago) and PCCM consulted 9/23.  Developed respiratory distress with VDRF and found to have PNA and moderate Rt pleural effusion on CT chest.  Pleural fluid was consistent with empyema and chest tube placed. PMHx systolic CHF (EF 78%)  Lines/tubes: ETT 9/24>> 08/19/12 Rt IJ CVL 9/24>> Rt chest tube 9/24>>  Cultures: MRSA PCR 9/24 - NEGATIVE Sputum 9/24>>Few S aureus (MSSA) Blood 9/24>>Coag neg Staph Rt pleural fluid 9/24>> Urine 9/24>>negative     Antiotics: Vancomycin 9/24>>08/20/12 Zosyn 9/24>>08/20/12 Ancef 08/20/12 (MSSA sputum) >> (4 more days)  Tests/events: 9//23 Echo>>EF 25 to 30% 9/24 VDRF, pulmonary infiltrates, pleural effusion, chest tube 9/24 Rt pleural fluid>>protein 2.9, LDH 124, glucose 153, WBC 1452 (3N, 89L, 25M, 0E).  Cytology negative for malignancy 9/24 CT chest>>patchy perihilar ASD b/l, moderate right pleural effusion 9/27 - pacer sheath removed 9/28 - extubated. Off pressors 9/29 - chylous pleural  noticed  Subjective: Extubated doing well. Wants to eat. Deconditioned  300cc chylous pleural fluid out of chest tube in 12h  Continues to have relatively frequent, very premature PVCs with fixed coupling interval and R-on-T appearance, but overall burden appears to be lower and no runs seen today.   Vital Signs: Temp:  [98 F (36.7 C)-98.5 F (36.9 C)] 98.3 F (36.8 C) (09/29 0400) Pulse Rate:  [64-76] 67  (09/29 0800) Resp:  [15-35] 24  (09/29 0800) BP: (86-117)/(45-69) 97/54 mmHg (09/29 0800) SpO2:  [95 %-100 %] 99 % (09/29 0700) FiO2 (%):  [30 %] 30 % (09/28  1600) Weight:  [106.8 kg (235 lb 7.2 oz)] 106.8 kg (235 lb 7.2 oz) (09/29 0500)  I/O last 3 completed shifts: In: 4075.4 [P.O.:120; I.V.:927.9; NG/GT:1290; IV Piggyback:1737.5] Out: 6530 [Urine:5760; Stool:100; Chest Tube:670]    Physical Examination: General - no distress HEENT - ETT, OG tube in place Cardiac - s2 s2 regular. Continues to have relatively frequent, very premature PVCs with fixed coupling interval and R-on-T appearance, but overall burden appears to be lower and no runs seen today 08/20/12 Chest - no wheeze, Rt chest tube in place Abd - soft, non tender Ext - no edema Neuro - On sedatin gtt -> Normal WUA. Wants extubation. RASS 0. Moves all 4s. CAM-ICU negative for delirium Dg Chest Port 1 View  08/19/2012  *RADIOLOGY REPORT*  Clinical Data: Follow up of "pulmonary infiltrates" and pleural effusion.  PORTABLE CHEST - 1 VIEW  Comparison: 1 day prior  Findings: Endotracheal tube terminates 1.5 cm above carina. Nasogastric tube poorly visualized distally but likely extends beyond the  inferior aspect of the film.  Right IJ central line unchanged.  Right-sided chest tube again identified.  The tiny right apical pneumothorax is again identified, at less than 5%.  Normal heart size.  No pleural fluid.  Similar mild left base air space disease.  IMPRESSION:  1. No significant change since one day prior. 2.  Similar left base atelectasis or infection. 3.  Right-sided chest tube remains in place with left than 5% right apical pneumothorax again identified.   Original Report Authenticated By: Consuello Bossier, M.D.  BMET Lab Results  Component Value Date   CREATININE 0.66 08/20/2012   BUN 12 08/20/2012   NA 131* 08/20/2012   K 4.3 08/20/2012   CL 94* 08/20/2012   CO2 31 08/20/2012    CBC Lab Results  Component Value Date   WBC 7.8 08/20/2012   HGB 12.1* 08/20/2012   HCT 35.2* 08/20/2012   MCV 91.0 08/20/2012   PLT 195 08/20/2012   Lab Results  Component Value Date   ALT 8  08/17/2012   AST 12 08/17/2012   ALKPHOS 42 08/17/2012   BILITOT 0.6 08/17/2012   BNP    Component Value Date/Time   PROBNP 2574.0* 08/17/2012 0445   ABG    Component Value Date/Time   PHART 7.498* 08/17/2012 0447   PCO2ART 36.4 08/17/2012 0447   PO2ART 125.0* 08/17/2012 0447   HCO3 28.1* 08/17/2012 0447   TCO2 29 08/17/2012 0447   O2SAT 99.0 08/17/2012 0447   CBG (last 3)   Basename 08/20/12 0404 08/20/12 0034 08/19/12 1940  GLUCAP 106* 103* 122*     ASSESSMENT AND PLAN  A: Acute respiratory failure with pulmonary infiltrates, and right pleural effusion.  Rt pleural effusion noted to appear purulent, but numbers show transudate with predominance of lymphocytes, and cytology negative for malignancy.  Hx of SCLC.  ?PNA vs recurrence of maligancy. - pn 08/20/2012: s/p extubation on 9/28 and doing well but has 300cc chylous effusion from chest tube in 12h - ? Chylothorax  P: Incentive spirometry, pulmonary toilet Retest pleural fluid for chylothorax (s/p XRT for cancer) and malignant cells on 08/20/12 If effusion persists with signifcant drainage then needs VATS this admit to sort out presence of cancer or not  A: Shock ?cardiogenic vs septic.  - on 08/20/2012  off pressors x 24-48h  P: Goal  MAP > 65 Goal CVP > 6  A: Recurrent VT with STEMI.  Systolic CHF.  - on 08/20/2012: Continues to have relatively frequent, very premature PVCs with fixed coupling interval and R-on-T appearance, but overall burden appears to be lower and no runs seen today. P Cards changing to po amid Per cardiology and EP cardiology  A: RENAL and lytes  Lab 08/20/12 0400 08/19/12 0500 08/18/12 0440 08/17/12 0445 08/16/12 0410 08/15/12 0445 08/13/12 1324  NA 131* 132* 132* 133* 130* -- --  K 4.3 3.8 -- -- -- -- --  CL 94* 93* 97 97 96 -- --  CO2 31 31 27 27 26  -- --  GLUCOSE 107* 98 152* 155* 115* -- --  BUN 12 12 11 10 14  -- --  CREATININE 0.66 0.67 0.68 0.73 0.95 -- --  CALCIUM 8.4 8.5 8.4 8.0* 8.0*  -- --  MG 2.4 -- 1.9 -- 1.9 2.1 1.8  PHOS 3.7 -- -- -- 1.6* -- 2.1*     on 9/29: Mild hyponatremia P: Goal K > 4; so replete Goal mag > 2; rechckec f/u BMET  A: Hyperglycemia. P: Change to Ac and HS on9/29  A: INFECTIOUS DISEASES No results found for this basename: PROCALCITON:5 in the last 168 hours MSSA in sputum P Change abx to Ancef on 08/20/12 and finish 3-4 more days for total 8 day antibiotics  A: Nutrition. P: STart po carb modified DM diet 08/20/12  Best Practice DVT >> SCD's SUP >> Protonix HOB > 30 degrees PT/OT and OOB to chair    9/7 and 9/28: Updated wife at bedside.  Explained that working dx for lung infiltrates is pneumonia, but that  there is still concern for possible malignancy.       Dr. Kalman Shan, M.D., [facility].C.P Pulmonary and Critical Care Medicine Staff Physician  System [facility] Pager: [phone], If no answer or between  15:00h - 7:00h: call [phone]  08/20/2012 9:01 AM

## 2012-08-20 NOTE — Progress Notes (Signed)
ANTIBIOTIC CONSULT NOTE - FOLLOW UP  Pharmacy Consult for vancomycin --> Cefazolin Indication: pneumonia (MSSA) - total 8 days antibiotics  Labs:  Basename 08/20/12 0400 08/19/12 0500 08/18/12 0440  WBC 7.8 -- 11.1*  HGB 12.1* -- 12.6*  PLT 195 -- 156  LABCREA -- -- --  CREATININE 0.66 0.67 0.68   Estimated Creatinine Clearance: 133.3 ml/min (by C-G formula based on Cr of 0.66).  Basename 08/19/12 0500 08/18/12 1800 08/17/12 1230  VANCOTROUGH -- 107.4* 5.8*  VANCOPEAK -- -- --  VANCORANDOM 11.6 -- --  GENTTROUGH -- -- --  GENTPEAK -- -- --  GENTRANDOM -- -- --  TOBRATROUGH -- -- --  TOBRAPEAK -- -- --  TOBRARND -- -- --  AMIKACINPEAK -- -- --  AMIKACINTROU -- -- --  AMIKACIN -- -- --     Assessment: 56 yo male who has been on Vancomycin/Zosyn since 9/24 (day #6 today) to change to cefazolin for MSSA pneumonia. Tracheal aspirate from 9/24 showing MSSA, pan-sensitive. Renal function has been stable (CrCl>100 ml/min), pt is afebrile, WBC trending down (now WNL at 7.8).   Goal of Therapy:  Resolution of infection  Plan:  Cefazolin 1g IV q8h for three more days to finish 8-day course Monitor fever curve, culture, renal function   Nicolasa Ducking, PharmD Clinical Pharmacist Pgr [phone] 08/20/2012,9:30 AM

## 2012-08-21 DIAGNOSIS — I472 Ventricular tachycardia, unspecified: Secondary | ICD-10-CM

## 2012-08-21 DIAGNOSIS — E876 Hypokalemia: Secondary | ICD-10-CM

## 2012-08-21 DIAGNOSIS — J189 Pneumonia, unspecified organism: Secondary | ICD-10-CM

## 2012-08-21 DIAGNOSIS — J869 Pyothorax without fistula: Secondary | ICD-10-CM

## 2012-08-21 DIAGNOSIS — I959 Hypotension, unspecified: Secondary | ICD-10-CM

## 2012-08-21 DIAGNOSIS — I219 Acute myocardial infarction, unspecified: Secondary | ICD-10-CM

## 2012-08-21 DIAGNOSIS — C3490 Malignant neoplasm of unspecified part of unspecified bronchus or lung: Secondary | ICD-10-CM

## 2012-08-21 DIAGNOSIS — J9600 Acute respiratory failure, unspecified whether with hypoxia or hypercapnia: Secondary | ICD-10-CM

## 2012-08-21 LAB — CULTURE, BLOOD (ROUTINE X 2): Culture: NO GROWTH

## 2012-08-21 LAB — BASIC METABOLIC PANEL
Chloride: 93 mEq/L — ABNORMAL LOW (ref 96–112)
Creatinine, Ser: 0.72 mg/dL (ref 0.50–1.35)
GFR calc Af Amer: >90 mL/min (ref >90)
Sodium: 131 mEq/L — ABNORMAL LOW (ref 135–145)

## 2012-08-21 LAB — MAGNESIUM: Magnesium: 2.4 mg/dL (ref 1.5–2.5)

## 2012-08-21 LAB — GLUCOSE, CAPILLARY
Glucose-Capillary: 101 mg/dL — ABNORMAL HIGH (ref 70–99)
Glucose-Capillary: 104 mg/dL — ABNORMAL HIGH (ref 70–99)
Glucose-Capillary: 104 mg/dL — ABNORMAL HIGH (ref 70–99)

## 2012-08-21 LAB — PHOSPHORUS: Phosphorus: 4 mg/dL (ref 2.3–4.6)

## 2012-08-21 MED ORDER — FUROSEMIDE 10 MG/ML IJ SOLN
INTRAMUSCULAR | Status: AC
  Filled 2012-08-21: qty 4

## 2012-08-21 MED ORDER — POTASSIUM CHLORIDE CRYS ER 20 MEQ PO TBCR: 40.0000 meq | EXTENDED_RELEASE_TABLET | Freq: Once | ORAL | Status: DC

## 2012-08-21 MED ORDER — ZOLPIDEM TARTRATE 5 MG PO TABS: 5.0000 mg | ORAL_TABLET | Freq: Every evening | ORAL | Status: DC | PRN

## 2012-08-21 MED ORDER — METOPROLOL TARTRATE 12.5 MG HALF TABLET
12.5000 mg | ORAL_TABLET | Freq: Two times a day (BID) | ORAL | Status: DC
  Filled 2012-08-21 (×2): qty 1

## 2012-08-21 MED ORDER — PANTOPRAZOLE SODIUM 40 MG PO TBEC
40.0000 mg | DELAYED_RELEASE_TABLET | Freq: Every day | ORAL | Status: DC
  Administered 2012-08-21 – 2012-09-07 (×18): 40 mg via ORAL
  Filled 2012-08-21 (×15): qty 1

## 2012-08-21 MED ORDER — ALPRAZOLAM 0.5 MG PO TABS
1.0000 mg | ORAL_TABLET | Freq: Once | ORAL | Status: AC
  Administered 2012-08-21: 1 mg via ORAL
  Filled 2012-08-21: qty 2

## 2012-08-21 MED ORDER — DIPHENHYDRAMINE HCL 25 MG PO CAPS
25.0000 mg | ORAL_CAPSULE | Freq: Every evening | ORAL | Status: DC | PRN
  Administered 2012-08-21 – 2012-08-25 (×4): 25 mg via ORAL
  Filled 2012-08-21 (×4): qty 1

## 2012-08-21 MED ORDER — POTASSIUM CHLORIDE CRYS ER 20 MEQ PO TBCR
30.0000 meq | EXTENDED_RELEASE_TABLET | Freq: Two times a day (BID) | ORAL | Status: DC
  Administered 2012-08-21 – 2012-09-01 (×24): 30 meq via ORAL
  Filled 2012-08-21 (×26): qty 1

## 2012-08-21 MED ORDER — TRAMADOL HCL 50 MG PO TABS
50.0000 mg | ORAL_TABLET | Freq: Four times a day (QID) | ORAL | Status: DC | PRN
  Administered 2012-08-26 – 2012-08-29 (×3): 50 mg via ORAL
  Filled 2012-08-21 (×4): qty 1

## 2012-08-21 MED ORDER — FUROSEMIDE 10 MG/ML IJ SOLN
20.0000 mg | Freq: Three times a day (TID) | INTRAMUSCULAR | Status: AC
  Administered 2012-08-21 (×2): 20 mg via INTRAVENOUS

## 2012-08-21 MED ORDER — METOPROLOL TARTRATE 12.5 MG HALF TABLET
12.5000 mg | ORAL_TABLET | Freq: Three times a day (TID) | ORAL | Status: DC
  Administered 2012-08-21 – 2012-08-25 (×12): 12.5 mg via ORAL
  Filled 2012-08-21 (×15): qty 1

## 2012-08-21 MED ORDER — ALPRAZOLAM 0.25 MG PO TABS: 0.2500 mg | ORAL_TABLET | Freq: Three times a day (TID) | ORAL | Status: DC | PRN

## 2012-08-21 NOTE — Consult Note (Signed)
ONCOLOGY  HOSPITAL CONSULTATION NOTE  [name] [name]                                MR#: [id number]  DOB: 11/29/1955                       CSN#: [id number]  Referring MD: [facility]  MD Primary MD: None  Reason for Consult: Lung Cancer   NWG:NFAOZH [name] is a 56 y.o. [place] male smoker with a history of Small Cell Lung Carcinoma diagnosed on 2010 treated at [facility] with chemo ("3 rounds". Onclologist name not recalled) and 33 radiation treatments to the anterior chest under the direction of Dr. Genelle Bal  He states that prophylactic radiation to the brain was received as well. He has not been seen since then. He was in his usual state of health until  08/13/2012 , when he was admitted with syncope and sustained VT with STEMI requiring  emergent cath with PCI/stent and pacer.Status complicated with respiratory distress/VDRF/ PNA as well as moderate Rt pleural effusion on CT chest. Pleural fluid was consistent with empyema requiring Chest tube placement. Rt pleural fluid showed  LDH 124 Cytology negative for malignancy. CT of the  Chest without contrast on   08/15/2012 revealed suspicious residual right hilar mass, but no further details about this mass are available. No other films are available regarding staging. Patient is now extubated.We were requested to see this patient while in hospital with recommendations, since patient may need a life vest for now, until cleared from Oncology (?PET) to proceed with ICD.        PMH:  Past Medical History  Diagnosis Date  . Cancer     lung  . STEMI (ST elevation myocardial infarction) 08/13/2012  . Sustained ventricular tachycardia with associated syncope 08/13/2012  . Hx of cancer of lung, 3 years ago treated with radiation and chemo and resolved 08/13/2012  . GERD (gastroesophageal reflux disease) 08/13/2012  . Hyperglycemia 08/13/2012  . Cardiomyopathy, ischemic, by cardiac cath 30-35% 08/13/12   08/13/2012  . CAD (coronary artery disease) 3 vessel disease 08/13/2012  . S/P coronary artery stent placement, acutely to RCA for acute Post. MI with BMS, 08/13/12 08/13/2012    Surgeries:  Past Surgical History  Procedure Date  . No past surgeries   . Cardiac catheterization     Allergies: No Known Allergies  Medications:   Prior to Admission:  Prescriptions prior to admission  Medication Sig Dispense Refill  . ibuprofen (ADVIL,MOTRIN) 200 MG tablet Take 600 mg by mouth every 6 (six) hours as needed. For pain      . omeprazole (PRILOSEC) 20 MG capsule Take 20 mg by mouth daily as needed. For heartburn        YQM:VHQIONGEXBMWU, ALPRAZolam, diphenhydrAMINE, levalbuterol, ondansetron (ZOFRAN) IV, traMADol, zolpidem  ROS: Constitutional: Negative for weight loss. Negative for fever, chills or  night sweats.  Eyes: Negative for blurred vision and double vision.  Respiratory: Negative for cough Cardio: Positive for chest pain on admission. No palpitations.  GI: Negative for  nausea, vomiting, diarrhea or constipation. No change in bowel caliber. No  Melena or hematochezia. No abdominal pain.  GU: Negative for hematuria. No loss of urinary control. No urinary retention. Skin: Negative for itching. No rash. No petechia. No easy  Bruising. Musculoskeletal: Denies back , arm pain.  Neurological:  No headaches.No confusion. No motor or sensory deficits.  Family History:   Mother alive  age 46 with DM, Father deceased age 24 with aneurysm (patient does not know if CNS or aortic). One brother, two sisters. No history of cancer in the immediate family  Social History:  Works as a Naval architect. Son from earlier marriage, [name] [name], is an Equities trader in [place]. Patient's marriage to Exira dates from 2009. She has 3 children of her own and 8 grandchildren. They are not church attenders.  Physical Exam    Filed Vitals:   08/21/12 1400  BP: 99/59  Pulse: 66  Temp:   Resp:       Filed Weights   08/18/12 0500 08/19/12 0500 08/20/12 0500  Weight: 242 lb 1 oz (109.8 kg) 240 lb 11.9 oz (109.2 kg) 235 lb 7.2 oz (106.8 kg)   General:   56 year old white male  in no acute distress A. and O. x3  well-developed, well-nourished.  HEENT: Normocephalic, atraumatic, PERRLA. Sclerae anicteric. Oral cavity without thrush or lesions. Flushed fascies. NECK:supple. no thyromegaly, no cervical or supraclavicular adenopathy  LUNGS: clear bilaterally . No wheezing, rhonchi or rales. No axillary masses. BREASTS: not examined. CARDIOVASCULAR: regular rate and rhythm normal S1-S2, 1/6 systolic murmur , rubs or gallops ABDOMEN: soft nontender , bowel sounds x4. No HSM. No masses palpable.  GU/rectal: deferred. EXTREMITIES: no clubbing cyanosis or edema. No bruising or petechial rash MUSCULOSKELETAL: no spinal tenderness.  NEURO: Non Focal. No Horner's.   Labs:  CBC   Lab 08/20/12 0400 08/18/12 0440 08/16/12 0845 08/16/12 0410 08/15/12 0445  WBC 7.8 11.1* 12.5* 12.7* 17.4*  HGB 12.1* 12.6* 12.4* 12.9* 14.3  HCT 35.2* 36.6* 36.2* 36.7* 41.7  PLT 195 156 138* 142* 223  MCV 91.0 90.4 90.3 90.0 90.7  MCH 31.3 31.1 30.9 31.6 31.1  MCHC 34.4 34.4 34.3 35.1 34.3  RDW 13.7 13.7 13.7 13.7 13.8  LYMPHSABS -- -- -- -- --  MONOABS -- -- -- -- --  EOSABS -- -- -- -- --  BASOSABS -- -- -- -- --  BANDABS -- -- -- -- --     CMP    Lab 08/21/12 0430 08/20/12 0400 08/19/12 0500 08/18/12 0440 08/17/12 0445 08/16/12 0410 08/15/12 0445  NA 131* 131* 132* 132* 133* -- --  K 3.8 4.3 3.8 4.3 2.9* -- --  CL 93* 94* 93* 97 97 -- --  CO2 29 31 31 27 27  -- --  GLUCOSE 93 107* 98 152* 155* -- --  BUN 10 12 12 11 10  -- --  CREATININE 0.72 0.66 0.67 0.68 0.73 -- --  CALCIUM 8.9 8.4 8.5 8.4 8.0* -- --  MG 2.4 2.4 -- 1.9 -- 1.9 2.1  AST -- -- -- -- 12 -- --  ALT -- -- -- -- 8 -- --  ALKPHOS -- -- -- -- 42 -- --  BILITOT -- -- -- -- 0.6 -- --        Component Value Date/Time   BILITOT 0.6  08/17/2012 0445   Imaging Studies:  Ct Chest Wo Contrast  08/15/2012  *RADIOLOGY REPORT*  Clinical Data: Myocardial infarction with extreme shortness of breath.  History of lung cancer.  CT CHEST WITHOUT CONTRAST  Technique:  Multidetector CT imaging of the chest was performed following the standard protocol without IV contrast.  Comparison: Radiographs 08/13/2012.  Chest CT 12/31/2008.  Findings: Within the limitations of noncontrast technique, no residual discretely enlarged left hilar or mediastinal lymph nodes  are demonstrated.  Right hilar assessment is limited by adjacent airspace disease.  There are moderate right and small left pleural effusions.  There are patchy airspace opacities with air bronchograms in both lungs. On the right, there is central perihilar component as well as a more peripheral component in the right upper lobe.  On the left, there are scattered components in the upper and lower lobes. There is some central airway narrowing bilaterally.  A femoral pacemaker is noted.  There is atherosclerosis of the coronary arteries.  The visualized upper abdomen appears unremarkable.  There are no acute or suspicious osseous findings.  IMPRESSION:  1.  Moderate right and small left pleural effusions with associated dependent atelectasis in both lungs. 2.  Patchy perihilar airspace opacities in both lungs have developed over the last several days and are suspicious for pneumonia. 3.  A residual right hilar mass is difficult to exclude on this noncontrast examination given the adjacent airspace disease. Follow-up imaging or bronchoscopy may be warranted.   Original Report Authenticated By: Gerrianne Scale, M.D.       Assessment : 56 y.o. [place] male with a history of SCLC diagnosed February 2010, treated by Drs. Choksi adn Crystal with standard platinum/etoposide (most likely, patient could not confirm,) with concurrent irradiation, followed by prophylactic cranial irradiation. Current  non-contrast CT raises question of possible recurrence. Pleural fluid cytology 08/15/2012 negative, repeat 08/11/2012 preliminary report nondiagnostic.   Plan: I am not convinced the patient's cancer has recurred. The best way to work this up would be a Chest CT with contrast and PET scan. However, the patient would like to resume follow-up with Dr. Doylene Canning in [place]. I will contact Dr. Doylene Canning and obtain an appointment for the patient within the next 10 days and set up these studies on an outpatient basis (he will have to come off his vest temporarily for the studies). I have discussed this with the patient and his wife, and will call her with the time and date. In the meantime I have counseled the patient on smoking cessation.  Please call me at [phone] if you have other concerns. Appreciate the consult.     [facility] 08/21/2012 2:44 PM

## 2012-08-21 NOTE — Progress Notes (Signed)
Subjective:  Awake and alert, extubated, weak but no increased SOB. He has chest tube on Rt.  Objective:  Vital Signs in the last 24 hours: Temp:  [97.3 F (36.3 C)-98.6 F (37 C)] 97.3 F (36.3 C) (09/30 0744) Pulse Rate:  [62-72] 67  (09/30 0700) Resp:  [13-33] 28  (09/30 0700) BP: (88-116)/(38-80) 106/39 mmHg (09/30 0700) SpO2:  [94 %-100 %] 96 % (09/30 0700)  Intake/Output from previous day:  Intake/Output Summary (Last 24 hours) at 08/21/12 0838 Last data filed at 08/21/12 0800  Gross per 24 hour  Intake 1203.4 ml  Output   6255 ml  Net -5051.6 ml      . ALPRAZolam  1 mg Oral Once  . amiodarone  400 mg Oral Daily  . antiseptic oral rinse  15 mL Mouth Rinse BID  . aspirin EC  81 mg Oral Daily  .  ceFAZolin (ANCEF) IV  1 g Intravenous Q8H  . influenza  inactive virus vaccine  0.5 mL Intramuscular Tomorrow-1000  . insulin aspart  0-15 Units Subcutaneous TID WC  . insulin aspart  0-5 Units Subcutaneous QHS  . lisinopril  2.5 mg Oral Daily  . magnesium oxide  400 mg Per NG tube BID  . metoprolol tartrate  12.5 mg Oral BID  . multivitamin with minerals  1 tablet Oral Daily  . pantoprazole  40 mg Oral Q0600  . pneumococcal 23 valent vaccine  0.5 mL Intramuscular Tomorrow-1000  . potassium chloride  30 mEq Oral BID  . Ticagrelor  90 mg Oral BID  . DISCONTD: insulin aspart  0-20 Units Subcutaneous Q4H  . DISCONTD: piperacillin-tazobactam (ZOSYN)  IV  3.375 g Intravenous Q8H  . DISCONTD: potassium chloride  20 mEq Oral BID  . DISCONTD: vancomycin  2,000 mg Intravenous Q8H   Physical Exam: General appearance: alert, cooperative and no distress No JVD Lungs: decreased breath sounds bilat, chest tube on Rt; no wheezing Heart: regular rate and rhythm; 1/6 sem ABD: soft; BS+; nontender No edema   Rate: 78  Rhythm: normal sinus rhythm and PVCs  Lab Results:  Basename 08/20/12 0400  WBC 7.8  HGB 12.1*  PLT 195    Basename 08/21/12 0430 08/20/12 0400  NA 131* 131*   K 3.8 4.3  CL 93* 94*  CO2 29 31  GLUCOSE 93 107*  BUN 10 12  CREATININE 0.72 0.66   No results found for this basename: TROPONINI:2,CK,MB:2 in the last 72 hours Hepatic Function Panel No results found for this basename: PROT,ALBUMIN,AST,ALT,ALKPHOS,BILITOT,BILIDIR,IBILI in the last 72 hours No results found for this basename: CHOL in the last 72 hours No results found for this basename: INR in the last 72 hours  Imaging: Imaging results have been reviewed  Cardiac Studies:  Assessment/Plan:   Principal Problem:  *Sustained ventricular tachycardia with associated syncope; Monomorphic Active Problems:  CAD (coronary artery disease) 3 vessel disease  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  Pleural effusion, intubated, chest tube place 08/16/12  Hx of cancer of lung, 3 years ago treated with radiation and chemo   Cardiomyopathy, ischemic, by cardiac cath 30-35% 08/13/12   S/P coronary artery stent placement, acutely to RCA for acute Post. MI with BMS, 08/13/12  GERD (gastroesophageal reflux disease)  Hyperglycemia, pt has been on meds for diabetes in the past  Pacemaker, temporary placed 08/13/12  Hypoxemia  Acute respiratory failure, extubated 08/19/12  Pneumonia, organism unspecified  Shock - on admission 08/13/12  Hypokalemia   Plan- Add low  dose beta blocker po. See Dr Lubertha Basque recommendations regarding Life Vest/ ICD. He is still draining a lot from his chest tube. Repeat pleural effusion studies sent yesterday. K+ and Hgb has been stable, will give him a lab holiday tomorrow.   Corine Shelter PA-C 08/21/2012, 8:38 AM  Patient seen and examined. Agree with assessment and plan. Clinically much improved. Will titrate lopressor to 12.5 mg every 8 hrs today and titrate as BP allows. F/U ECG today. Oncology consultation.   Lennette Bihari, MD, [facility] 08/21/2012 9:01 AM

## 2012-08-21 NOTE — Progress Notes (Signed)
Nutrition Follow-up  Intervention:   1. Recommend liberalized, pt with no HX of DM  2. RD will continue to follow    Assessment:   S/p extubation 9/28.  Waiting to be fitted for a life vest temp while determining possible malignancy. PO intake 80% last night. Reports PO intake is returning to normal. Denies any unintentional weight loss PTA, no chewing/swallowing problems post extubation. "not a big eater" Would like to be able to get sweet tea, no hx of DM. Some high blood sugars this admission, but only mildly elevated at this time.    Diet Order:  Carb Mod Medium, heart healthy  Supplements: none TF: none  Meds: Scheduled Meds:   . ALPRAZolam  1 mg Oral Once  . amiodarone  400 mg Oral Daily  . antiseptic oral rinse  15 mL Mouth Rinse BID  . aspirin EC  81 mg Oral Daily  .  ceFAZolin (ANCEF) IV  1 g Intravenous Q8H  . influenza  inactive virus vaccine  0.5 mL Intramuscular Tomorrow-1000  . insulin aspart  0-15 Units Subcutaneous TID WC  . insulin aspart  0-5 Units Subcutaneous QHS  . lisinopril  2.5 mg Oral Daily  . magnesium oxide  400 mg Per NG tube BID  . multivitamin with minerals  1 tablet Oral Daily  . pneumococcal 23 valent vaccine  0.5 mL Intramuscular Tomorrow-1000  . potassium chloride  30 mEq Oral BID  . Ticagrelor  90 mg Oral BID  . DISCONTD: chlorhexidine  15 mL Mouth Rinse BID  . DISCONTD: insulin aspart  0-20 Units Subcutaneous Q4H  . DISCONTD: multivitamin  5 mL Per Tube Daily  . DISCONTD: piperacillin-tazobactam (ZOSYN)  IV  3.375 g Intravenous Q8H  . DISCONTD: potassium chloride  20 mEq Oral BID  . DISCONTD: potassium chloride  20 mEq Oral BID  . DISCONTD: vancomycin  2,000 mg Intravenous Q8H   Continuous Infusions:   . sodium chloride 20 mL/hr at 08/20/12 1200  . DISCONTD: sodium chloride 10 mL/hr at 08/19/12 1258   PRN Meds:.acetaminophen, diphenhydrAMINE, levalbuterol, ondansetron (ZOFRAN) IV, DISCONTD: fentaNYL  Labs:  CMP     Component Value  Date/Time   NA 131* 08/21/2012 0430   K 3.8 08/21/2012 0430   CL 93* 08/21/2012 0430   CO2 29 08/21/2012 0430   GLUCOSE 93 08/21/2012 0430   BUN 10 08/21/2012 0430   CREATININE 0.72 08/21/2012 0430   CALCIUM 8.9 08/21/2012 0430   PROT 5.1* 08/17/2012 0445   ALBUMIN 2.3* 08/17/2012 0445   AST 12 08/17/2012 0445   ALT 8 08/17/2012 0445   ALKPHOS 42 08/17/2012 0445   BILITOT 0.6 08/17/2012 0445   GFRNONAA >90 08/21/2012 0430   GFRAA >90 08/21/2012 0430   CBG (last 3)   Basename 08/21/12 0743 08/20/12 2217 08/20/12 1557  GLUCAP 101* 101* 110*     Lab Results  Component Value Date   HGBA1C 5.9* 08/13/2012     Intake/Output Summary (Last 24 hours) at 08/21/12 0851 Last data filed at 08/21/12 0800  Gross per 24 hour  Intake 1203.4 ml  Output   6255 ml  Net -5051.6 ml    Weight Status:  235 lbs, trending down but still a few lbs above admission weight   Re-estimated needs:  1950-2150 kcal, 90-105 gm protein   Nutrition Dx:  Inadequate oral intake r/t inability to eat AEB mechanical ventilation ---Resolved   Goal:  Enteral nutrition to provide 60-70% of estimated calorie needs (22-25 kcals/kg ideal body  weight) and >/= 90% of estimated protein needs, based on ASPEN guidelines for permissive underfeeding in critically ill obese individuals. -no longer applicable   New Goal: continue to meet 90-100% estimated nutrition needs   Monitor:  PO intake, weight, labs   Clarene Duke RD, LDN Pager [phone] After Hours pager [phone]

## 2012-08-21 NOTE — Progress Notes (Signed)
Name: [name] [name] MRN: [id number] DOB: 1956-05-07    LOS: 8  Referring Provider:  [name] (cards) Reason for Referral:  Recurrent VT, hx small cell lung ca  PULMONARY / CRITICAL CARE MEDICINE  HPI:  56 yo male smoker admitted 08/13/2012 with syncope and sustained VT with STEMI.  He had emergent cath with PCI/stent and pacer.  Noted to have abnormal CXR with hx of SCLC (Tx 3 yrs ago) and PCCM consulted 9/23.  Developed respiratory distress with VDRF and found to have PNA and moderate Rt pleural effusion on CT chest.  Pleural fluid was consistent with empyema and chest tube placed. PMHx systolic CHF (EF 95%)  Lines/tubes: ETT 9/24>> 08/19/12 Rt IJ CVL 9/24>> Rt chest tube 9/24>>  Cultures: MRSA PCR 9/24 - NEGATIVE Sputum 9/24>>Few S aureus (MSSA) Blood 9/24>>Coag neg Staph Rt pleural fluid 9/24>> Urine 9/24>>negative     Antiotics: Vancomycin 9/24>>08/20/12 Zosyn 9/24>>08/20/12 Ancef 08/20/12 (MSSA sputum) >> (4 more days)  Tests/events: 9//23 Echo>>EF 25 to 30% 9/24 VDRF, pulmonary infiltrates, pleural effusion, chest tube 9/24 Rt pleural fluid>>protein 2.9, LDH 124, glucose 153, WBC 1452 (3N, 89L, 32M, 0E).  Cytology negative for malignancy 9/24 CT chest>>patchy perihilar ASD b/l, moderate right pleural effusion 9/27 - pacer sheath removed 9/28 - extubated. Off pressors 9/29 - chylous pleural  noticed  Subjective: Extubated doing well. Wants to eat. Deconditioned  300cc chylous pleural fluid out of chest tube in 12h  Continues to have relatively frequent, very premature PVCs with fixed coupling interval and R-on-T appearance, but overall burden appears to be lower and no runs seen today.   Vital Signs: Temp:  [97.3 F (36.3 C)-98.6 F (37 C)] 97.3 F (36.3 C) (09/30 0744) Pulse Rate:  [62-72] 70  (09/30 0800) Resp:  [13-33] 24  (09/30 0800) BP: (88-116)/(38-80) 111/46 mmHg (09/30 0800) SpO2:  [94 %-100 %] 98 % (09/30 0800)  I/O last 3 completed shifts: In:  2730.5 [P.O.:760; I.V.:720.5; IV Piggyback:1250] Out: 6213 [YQMVH:8469; Stool:200; Chest Tube:800]    Physical Examination: General - no distress HEENT - ETT, OG tube in place Cardiac - s2 s2 regular. Continues to have relatively frequent, very premature PVCs with fixed coupling interval and R-on-T appearance, but overall burden appears to be lower and no runs seen today 08/20/12 Chest - no wheeze, Rt chest tube in place Abd - soft, non tender Ext - no edema Neuro - On sedatin gtt -> Normal WUA. Wants extubation. RASS 0. Moves all 4s. CAM-ICU negative for delirium Dg Chest Port 1 View  08/20/2012  *RADIOLOGY REPORT*  Clinical Data: Right chest tube  PORTABLE CHEST - 1 VIEW  Comparison: 08/19/2012  Findings: Stable right chest tube.  Possible tiny right apical pneumothorax, equivocal.  Patchy left lower lobe and lingular opacities, suspicious for pneumonia, stable versus mildly improved.  Stable right IJ venous catheter.  Interval extubation and removal of enteric tube.  IMPRESSION: Stable right chest tube.  Possible tiny right apical pneumothorax, equivocal.  Patchy left lower lobe and lingular opacities, suspicious for pneumonia, stable versus mildly improved.   Original Report Authenticated By: Charline Bills, M.D.     BMET Lab Results  Component Value Date   CREATININE 0.72 08/21/2012   BUN 10 08/21/2012   NA 131* 08/21/2012   K 3.8 08/21/2012   CL 93* 08/21/2012   CO2 29 08/21/2012    CBC Lab Results  Component Value Date   WBC 7.8 08/20/2012   HGB 12.1* 08/20/2012   HCT 35.2* 08/20/2012  MCV 91.0 08/20/2012   PLT 195 08/20/2012   Lab Results  Component Value Date   ALT 8 08/17/2012   AST 12 08/17/2012   ALKPHOS 42 08/17/2012   BILITOT 0.6 08/17/2012   BNP    Component Value Date/Time   PROBNP 2574.0* 08/17/2012 0445   ABG    Component Value Date/Time   PHART 7.498* 08/17/2012 0447   PCO2ART 36.4 08/17/2012 0447   PO2ART 125.0* 08/17/2012 0447   HCO3 28.1* 08/17/2012 0447    TCO2 29 08/17/2012 0447   O2SAT 99.0 08/17/2012 0447   CBG (last 3)   Basename 08/21/12 0743 08/20/12 2217 08/20/12 1557  GLUCAP 101* 101* 110*     ASSESSMENT AND PLAN  A: Acute respiratory failure with pulmonary infiltrates, and right pleural effusion.  Rt pleural effusion noted to appear purulent, but numbers show transudate with predominance of lymphocytes, and cytology negative for malignancy.  Hx of SCLC.  ?PNA vs recurrence of maligancy. - pn 08/21/2012: s/p extubation on 9/28 and doing well but has 300cc chylous effusion from chest tube in 12h - ? Chylothorax  P: Incentive spirometry, pulmonary toilet Retest pleural fluid for chylothorax (s/p XRT for cancer) and malignant cells on 08/20/12 If effusion persists with signifcant drainage then needs VATS this admit to sort out presence of cancer or not  A: Shock ?cardiogenic vs septic.  - on 08/21/2012  off pressors x 24-48h  P: Goal  MAP > 65 Goal CVP > 6 ACE and beta blocker increased.  A: Recurrent VT with STEMI.  Systolic CHF.  - on 08/21/2012: Continues to have relatively frequent, very premature PVCs with fixed coupling interval and R-on-T appearance, but overall burden appears to be lower and no runs seen today. P Amio to PO. Per cardiology and EP cardiology.  A: RENAL and lytes  Lab 08/21/12 0430 08/20/12 0400 08/19/12 0500 08/18/12 0440 08/17/12 0445 08/16/12 0410 08/15/12 0445  NA 131* 131* 132* 132* 133* -- --  K 3.8 4.3 -- -- -- -- --  CL 93* 94* 93* 97 97 -- --  CO2 29 31 31 27 27  -- --  GLUCOSE 93 107* 98 152* 155* -- --  BUN 10 12 12 11 10  -- --  CREATININE 0.72 0.66 0.67 0.68 0.73 -- --  CALCIUM 8.9 8.4 8.5 8.4 8.0* -- --  MG 2.4 2.4 -- 1.9 -- 1.9 2.1  PHOS 4.0 3.7 -- -- -- 1.6* --     on 9/29: Mild hyponatremia P: Goal K > 4; so replete Goal mag > 2; rechckec f/u BMET Additional K and lasix today.  A: Hyperglycemia. P: Change to Ac and HS on9/29  A: INFECTIOUS DISEASES No results found for  this basename: PROCALCITON:5 in the last 168 hours MSSA in sputum P Change abx to Ancef on 08/20/12 and finish 3-4 more days for total 8 day antibiotics  A: Nutrition. P: STart po carb modified DM diet 08/20/12  Best Practice DVT >> SCD's SUP >> Protonix HOB > 30 degrees PT/OT and OOB to chair   Would hold in the ICU until rhythm is more stable.  Keep CT in for today, will monitor output, if <150 ml/24 hours then will consider removal.  Keep in for now.  PCCM will continue to follow with you.  Alyson Reedy, M.D. [facility] Pulmonary/Critical Care Medicine. Pager: [phone]. After hours pager: [phone].

## 2012-08-21 NOTE — Evaluation (Signed)
Physical Therapy Evaluation Patient Details Name: [name] [name] MRN: [id number] DOB: [birth year] Today's Date: 08/21/2012 Time: 2130-8657 PT Time Calculation (min): 27 min  PT Assessment / Plan / Recommendation Clinical Impression  Patient s/p STEMI with stent and pacer with VDRF and PNA.  Has chest tube as well.  Ambulated fairly well with the RW with need of occasional cues for sequencing and technique.  May need a RW but should progress to not using a RW.  May need HHPT f/u as well.      PT Assessment  Patient needs continued PT services    Follow Up Recommendations  Home health PT;Supervision - Intermittent    Barriers to Discharge        Equipment Recommendations  Rolling walker with 5" wheels (may need a RW)    Recommendations for Other Services     Frequency Min 3X/week    Precautions / Restrictions Precautions Precautions: Fall   Pertinent Vitals/Pain VSS, No pain      Mobility  Bed Mobility Bed Mobility: Rolling Right;Right Sidelying to Sit;Sitting - Scoot to Edge of Bed Rolling Right: 4: Min guard;With rail Right Sidelying to Sit: 4: Min guard;With rails;HOB elevated Sitting - Scoot to Edge of Bed: 4: Min guard Details for Bed Mobility Assistance: Pt. needs cues for technique Transfers Transfers: Sit to Stand;Stand to Sit Sit to Stand: 4: Min guard;With upper extremity assist;From bed Stand to Sit: 4: Min guard;With upper extremity assist;With armrests;To chair/3-in-1 Details for Transfer Assistance: cues for hand placement. Ambulation/Gait Ambulation/Gait Assistance: 4: Min guard Ambulation Distance (Feet): 350 Feet Assistive device: Rolling walker Ambulation/Gait Assistance Details: cues to stay close to RW.  Patient needed cues for sequencing at times.   Gait Pattern: Step-through pattern;Decreased stride length Gait velocity: decreased Stairs: No Wheelchair Mobility Wheelchair Mobility: No              PT Diagnosis: Generalized weakness  PT  Problem List: Decreased activity tolerance;Decreased balance;Decreased mobility;Decreased safety awareness;Decreased knowledge of use of DME;Decreased knowledge of precautions PT Treatment Interventions: DME instruction;Gait training;Stair training;Functional mobility training;Therapeutic activities;Therapeutic exercise;Balance training;Patient/family education   PT Goals Acute Rehab PT Goals PT Goal Formulation: With patient Time For Goal Achievement: 09/04/12 Potential to Achieve Goals: Good Pt will go Supine/Side to Sit: Independently PT Goal: Supine/Side to Sit - Progress: Goal set today Pt will go Sit to Stand: Independently PT Goal: Sit to Stand - Progress: Goal set today Pt will Ambulate: >150 feet;with modified independence;with least restrictive assistive device PT Goal: Ambulate - Progress: Goal set today Pt will Go Up / Down Stairs: 3-5 stairs;with supervision;with least restrictive assistive device PT Goal: Up/Down Stairs - Progress: Goal set today  Visit Information  Last PT Received On: 08/21/12 Assistance Needed: +2 (lines)    Subjective Data  Subjective: "I feel like I can start moving more." Patient Stated Goal: To go home   Prior Functioning       Cognition  Overall Cognitive Status: Appears within functional limits for tasks assessed/performed Arousal/Alertness: Awake/alert Orientation Level: Appears intact for tasks assessed Behavior During Session: [facility] for tasks performed    Extremity/Trunk Assessment Right Upper Extremity Assessment RUE ROM/Strength/Tone: [facility] for tasks assessed Left Upper Extremity Assessment LUE ROM/Strength/Tone: [facility] for tasks assessed Right Lower Extremity Assessment RLE ROM/Strength/Tone: [facility] for tasks assessed Left Lower Extremity Assessment LLE ROM/Strength/Tone: [facility] for tasks assessed Trunk Assessment Trunk Assessment: Normal   Balance    End of Session PT - End of Session Equipment Utilized During Treatment: Gait  belt Activity Tolerance: Patient tolerated treatment well Patient left: in chair;with call bell/phone within reach Nurse Communication: Mobility status       [name],Vetra Shinall 08/21/2012, 3:17 PM  [facility] [phone] (pager)

## 2012-08-21 NOTE — Progress Notes (Signed)
Patient ID: [name] [name], male   DOB: December 06, 1955, 56 y.o.   MRN: [id number] Subjective:  Minimal chest pain. Minimal dyspnea.  Objective:  Vital Signs in the last 24 hours: Temp:  [97.3 F (36.3 C)-98.6 F (37 C)] 97.3 F (36.3 C) (09/30 0744) Pulse Rate:  [62-72] 67  (09/30 0700) Resp:  [13-33] 28  (09/30 0700) BP: (88-116)/(38-80) 106/39 mmHg (09/30 0700) SpO2:  [94 %-100 %] 96 % (09/30 0700)  Intake/Output from previous day: 09/29 0701 - 09/30 0700 In: 1492.6 [P.O.:640; I.V.:440.1; IV Piggyback:412.5] Out: 6175 [Urine:5575; Stool:100; Chest Tube:500] Intake/Output from this shift: Total I/O In: -  Out: 80 [Chest Tube:80]  Physical Exam: Well appearing middle aged man, NAD HEENT: Unremarkable Neck:  No JVD, no thyromegally Lungs:  Rales in the bases. No wheezes. Indwelling chest tube. HEART:  Regular rate rhythm, no murmurs, no rubs, no clicks Abd:  soft, positive bowel sounds, no organomegally, no rebound, no guarding Ext:  2 plus pulses, no edema, no cyanosis, no clubbing Skin:  No rashes no nodules Neuro:  CN II through XII intact, motor grossly intact  Lab Results:  Basename 08/20/12 0400  WBC 7.8  HGB 12.1*  PLT 195    Basename 08/21/12 0430 08/20/12 0400  NA 131* 131*  K 3.8 4.3  CL 93* 94*  CO2 29 31  GLUCOSE 93 107*  BUN 10 12  CREATININE 0.72 0.66   No results found for this basename: TROPONINI:2,CK,MB:2 in the last 72 hours Hepatic Function Panel No results found for this basename: PROT,ALBUMIN,AST,ALT,ALKPHOS,BILITOT,BILIDIR,IBILI in the last 72 hours No results found for this basename: CHOL in the last 72 hours No results found for this basename: PROTIME in the last 72 hours  Imaging: Dg Chest Port 1 View  08/20/2012  *RADIOLOGY REPORT*  Clinical Data: Right chest tube  PORTABLE CHEST - 1 VIEW  Comparison: 08/19/2012  Findings: Stable right chest tube.  Possible tiny right apical pneumothorax, equivocal.  Patchy left lower lobe and lingular  opacities, suspicious for pneumonia, stable versus mildly improved.  Stable right IJ venous catheter.  Interval extubation and removal of enteric tube.  IMPRESSION: Stable right chest tube.  Possible tiny right apical pneumothorax, equivocal.  Patchy left lower lobe and lingular opacities, suspicious for pneumonia, stable versus mildly improved.   Original Report Authenticated By: Charline Bills, M.D.     Cardiac Studies: Tele - NSR with PVC's. Assessment/Plan:  1. VT - stable on amio 400/day. Uptitrate beta blocker as blood pressure and HR tolerate. Would use a life vest 1-2 months while he is recovering and we can be sure as to whether he has or does not have recurrent lung CA. As patient did not infarct, would place life vest only long enough to allow him to recover from current hospitalization and to determine whether he has recurrent lung CA. If his PET scan is negative then would proceed with ICD for secondary prevention.  LOS: 8 days    Gregg Taylor,M.D. 08/21/2012, 8:09 AM

## 2012-08-22 ENCOUNTER — Inpatient Hospital Stay (HOSPITAL_COMMUNITY): Payer: BC Managed Care – PPO

## 2012-08-22 LAB — BASIC METABOLIC PANEL
BUN: 12 mg/dL (ref 6–23)
CO2: 28 mEq/L (ref 19–32)
Calcium: 9.2 mg/dL (ref 8.4–10.5)
Chloride: 91 mEq/L — ABNORMAL LOW (ref 96–112)
Creatinine, Ser: 0.76 mg/dL (ref 0.50–1.35)
Glucose, Bld: 103 mg/dL — ABNORMAL HIGH (ref 70–99)

## 2012-08-22 LAB — PROTEIN, BODY FLUID: Total protein, fluid: 3.8 g/dL

## 2012-08-22 LAB — CBC
HCT: 41 % (ref 39.0–52.0)
Hemoglobin: 14.2 g/dL (ref 13.0–17.0)
MCHC: 34.6 g/dL (ref 30.0–36.0)
MCV: 89.1 fL (ref 78.0–100.0)

## 2012-08-22 LAB — BODY FLUID CELL COUNT WITH DIFFERENTIAL
Eos, Fluid: 4 %
Monocyte-Macrophage-Serous Fluid: 10 % — ABNORMAL LOW (ref 50–90)
Neutrophil Count, Fluid: 23 % (ref 0–25)
Total Nucleated Cell Count, Fluid: 5670 cu mm — ABNORMAL HIGH (ref 0–1000)

## 2012-08-22 LAB — CHOLESTEROL, BODY FLUID

## 2012-08-22 LAB — TRIGLYCERIDES: Triglycerides: 128 mg/dL (ref <150)

## 2012-08-22 LAB — GLUCOSE, CAPILLARY: Glucose-Capillary: 127 mg/dL — ABNORMAL HIGH (ref 70–99)

## 2012-08-22 LAB — MAGNESIUM: Magnesium: 2.3 mg/dL (ref 1.5–2.5)

## 2012-08-22 LAB — TRIGLYCERIDES, BODY FLUIDS

## 2012-08-22 LAB — LACTATE DEHYDROGENASE, PLEURAL OR PERITONEAL FLUID

## 2012-08-22 MED ORDER — LISINOPRIL 5 MG PO TABS
5.0000 mg | ORAL_TABLET | Freq: Every day | ORAL | Status: DC
  Administered 2012-08-22 – 2012-08-23 (×2): 5 mg via ORAL
  Filled 2012-08-22 (×4): qty 1

## 2012-08-22 NOTE — Progress Notes (Signed)
Name: [name] [name] MRN: [id number] DOB: 1956-04-09    LOS: 9  Referring Provider:  [name] (cards) Reason for Referral:  Recurrent VT, hx small cell lung ca  PULMONARY / CRITICAL CARE MEDICINE  HPI:  56 yo male smoker admitted 08/13/2012 with syncope and sustained VT with STEMI.  He had emergent cath with PCI/stent and pacer.  Noted to have abnormal CXR with hx of SCLC (Tx 3 yrs ago) and PCCM consulted 9/23.  Developed respiratory distress with VDRF and found to have PNA and moderate Rt pleural effusion on CT chest.  Pleural fluid was consistent with empyema and chest tube placed. PMHx systolic CHF (EF 40%)  Lines/tubes: ETT 9/24>> 08/19/12 Rt IJ CVL 9/24>> Rt chest tube 9/24>>  Cultures: MRSA PCR 9/24 - NEGATIVE Sputum 9/24>>Few S aureus (MSSA) Blood 9/24>>Coag neg Staph Rt pleural fluid 9/24>> Urine 9/24>>negative     Antiotics: Vancomycin 9/24>>08/20/12 Zosyn 9/24>>08/20/12 Ancef 08/20/12 (MSSA sputum) >> (4 more days)  Tests/events: 9//23 Echo>>EF 25 to 30% 9/24 VDRF, pulmonary infiltrates, pleural effusion, chest tube 9/24 Rt pleural fluid>>protein 2.9, LDH 124, glucose 153, WBC 1452 (3N, 89L, 44M, 0E).  Cytology negative for malignancy 9/24 CT chest>>patchy perihilar ASD b/l, moderate right pleural effusion 9/27 - pacer sheath removed 9/28 - extubated. Off pressors 9/29 - chylous pleural  noticed  Subjective: Extubated doing well. Wants to eat. Deconditioned  300cc chylous pleural fluid out of chest tube in 12h  Continues to have relatively frequent, very premature PVCs with fixed coupling interval and R-on-T appearance, but overall burden appears to be lower and no runs seen today.   Vital Signs: Temp:  [97.7 F (36.5 C)-98.6 F (37 C)] 98.6 F (37 C) (10/01 0800) Pulse Rate:  [63-79] 68  (10/01 0800) Resp:  [20-25] 20  (09/30 2000) BP: (88-119)/(38-85) 99/85 mmHg (10/01 0800) SpO2:  [90 %-98 %] 98 % (10/01 0800)  I/O last 3 completed shifts: In: 1774  [P.O.:1060; I.V.:460; IV Piggyback:254] Out: 5680 [Urine:4975; Stool:200; Chest Tube:505]    Physical Examination: General - no distress HEENT - ETT, OG tube in place Cardiac - s2 s2 regular. Continues to have relatively frequent, very premature PVCs with fixed coupling interval and R-on-T appearance, but overall burden appears to be lower and no runs seen today 08/20/12 Chest - no wheeze, Rt chest tube in place Abd - soft, non tender Ext - no edema Neuro - On sedatin gtt -> Normal WUA. Wants extubation. RASS 0. Moves all 4s. CAM-ICU negative for delirium Dg Chest Port 1 View  08/22/2012  *RADIOLOGY REPORT*  Clinical Data: Evaluate chest tube positioning  PORTABLE CHEST - 1 VIEW  Comparison: 08/20/2012; 08/19/2012; nine of 27/2013  Findings:  Grossly unchanged cardiac silhouette and mediastinal contours. Stable position of support apparatus.  Minimal increase in small right apical pneumothorax. Overall improved aeration of the lungs with persistent right hilar heterogeneous opacities.  No new focal airspace opacity.  No definite pleural effusion.  Unchanged bones.  IMPRESSION: 1.  Stable positioning of support apparatus.  Minimal increase in size of now small right apical pneumothorax.  2.  Improved aeration of the lungs with persistent right perihilar heterogeneous opacities.   Original Report Authenticated By: Waynard Reeds, M.D.     BMET Lab Results  Component Value Date   CREATININE 0.76 08/22/2012   BUN 12 08/22/2012   NA 131* 08/22/2012   K 3.9 08/22/2012   CL 91* 08/22/2012   CO2 28 08/22/2012    CBC Lab Results  Component Value Date   WBC 10.5 08/22/2012   HGB 14.2 08/22/2012   HCT 41.0 08/22/2012   MCV 89.1 08/22/2012   PLT 301 08/22/2012   Lab Results  Component Value Date   ALT 8 08/17/2012   AST 12 08/17/2012   ALKPHOS 42 08/17/2012   BILITOT 0.6 08/17/2012   BNP    Component Value Date/Time   PROBNP 2216.0* 08/21/2012 0930   ABG    Component Value Date/Time   PHART  7.498* 08/17/2012 0447   PCO2ART 36.4 08/17/2012 0447   PO2ART 125.0* 08/17/2012 0447   HCO3 28.1* 08/17/2012 0447   TCO2 29 08/17/2012 0447   O2SAT 99.0 08/17/2012 0447   CBG (last 3)   Basename 08/22/12 0743 08/21/12 2210 08/21/12 1658  GLUCAP 101* 104* 104*     ASSESSMENT AND PLAN  A: Acute respiratory failure with pulmonary infiltrates, and right pleural effusion.  Rt pleural effusion noted to appear purulent, but numbers show transudate with predominance of lymphocytes and relatively high trigs (cut off for chylothorax is 110 and patient's is 97 but without eating that number can be falsely lowered), and cytology negative for malignancy.  DDx for chylo would be central line placement (less likely given that the fluid was there pre line placement) vs thoracic duct damage from previous radiation, patient denies any history of trauma.  Hx of SCLC.  CT drainage now is 350 over the past 24 hours.  P: Incentive spirometry, pulmonary toilet Will retest pleural fluid completely again today with serum levels for comparison. If effusion persists with signifcant drainage then needs VATS but since it is slowly decreasing will keep monitoring.  A: Shock ?cardiogenic vs septic.  - Resolved.  P: Goal  MAP > 65 Goal CVP > 6 ACE increase noted. Continue current dose of beta blockers.  A: Recurrent VT with STEMI.  Systolic CHF.  Improved.  P Amio to PO. Per cardiology and EP cardiology.  A: RENAL and lytes  Lab 08/22/12 0450 08/21/12 0430 08/20/12 0400 08/19/12 0500 08/18/12 0440 08/16/12 0410  NA 131* 131* 131* 132* 132* --  K 3.9 3.8 -- -- -- --  CL 91* 93* 94* 93* 97 --  CO2 28 29 31 31 27  --  GLUCOSE 103* 93 107* 98 152* --  BUN 12 10 12 12 11  --  CREATININE 0.76 0.72 0.66 0.67 0.68 --  CALCIUM 9.2 8.9 8.4 8.5 8.4 --  MG 2.3 2.4 2.4 -- 1.9 1.9  PHOS 4.2 4.0 3.7 -- -- 1.6*    Intake/Output Summary (Last 24 hours) at 08/22/12 0844 Last data filed at 08/22/12 0800  Gross per 24  hour  Intake   1554 ml  Output   2575 ml  Net  -1021 ml    on 9/29: Mild hyponatremia P: Goal K > 4; so replete Goal mag > 2; recheck f/u BMET Hold additional lasix today and replace K.  A: Hyperglycemia. P: Change to Ac and HS on 9/29  A: INFECTIOUS DISEASES No results found for this basename: PROCALCITON:5 in the last 168 hours MSSA in sputum P Changed abx to Ancef on 08/20/12 and finish 3 more days for total 8 day antibiotics  A: Nutrition. P: PO carb modified DM diet 08/20/12  Best Practice DVT >> SCD's SUP >> Protonix HOB > 30 degrees PT/OT and OOB to chair   Transfer to SDU, PCCM will follow.  Alyson Reedy, M.D. [facility] Pulmonary/Critical Care Medicine. Pager: [phone]. After hours pager: [phone].

## 2012-08-22 NOTE — Progress Notes (Signed)
Physical Therapy Treatment Patient Details Name: [name] [name] MRN: [id number] DOB: 1955-12-05 Today's Date: 08/22/2012 Time: 1050-1108 PT Time Calculation (min): 18 min  PT Assessment / Plan / Recommendation Comments on Treatment Session  Pt moving well.  Pt very motivated!    Follow Up Recommendations  Home health PT;Supervision - Intermittent    Barriers to Discharge        Equipment Recommendations  Rolling walker with 5" wheels    Recommendations for Other Services    Frequency Min 3X/week   Plan Discharge plan remains appropriate    Precautions / Restrictions Precautions Precautions: Fall Restrictions Weight Bearing Restrictions: No    Pertinent Vitals/Pain C/o mild dizziness ambulation but did not worsen with distance.      Mobility  Bed Mobility Bed Mobility: Not assessed Transfers Transfers: Stand to Sit Stand to Sit: 5: Supervision;With upper extremity assist;With armrests;To chair/3-in-1 Details for Transfer Assistance: Pt ambulating back to recliner from bathroom upon arrival.  Supervision for safety with stand>sit.  Demonstrated safe technique.  Ambulation/Gait Ambulation/Gait Assistance: 5: Supervision Ambulation Distance (Feet): 600 Feet Assistive device: Rolling walker Ambulation/Gait Assistance Details: Cues to decrease reliance of UE's on RW.  Pt able to tolerate gait challenges such as head turns, directional turns, & gait speed changes without any LOB Gait Pattern: Step-through pattern;Decreased stride length Stairs: No Wheelchair Mobility Wheelchair Mobility: No      PT Goals Acute Rehab PT Goals Time For Goal Achievement: 09/04/12 Potential to Achieve Goals: Good Pt will go Supine/Side to Sit: Independently Pt will go Sit to Stand: Independently Pt will Ambulate: >150 feet;with modified independence;with least restrictive assistive device PT Goal: Ambulate - Progress: Progressing toward goal Pt will Go Up / Down Stairs: 3-5 stairs;with  supervision;with least restrictive assistive device  Visit Information  Last PT Received On: 08/22/12 Assistance Needed: +1    Subjective Data  Subjective: "Im feeling pretty good today" Patient Stated Goal: To go home   Cognition  Overall Cognitive Status: Appears within functional limits for tasks assessed/performed Arousal/Alertness: Awake/alert Orientation Level: Appears intact for tasks assessed Behavior During Session: [facility] for tasks performed    Balance     End of Session PT - End of Session Equipment Utilized During Treatment: Gait belt Activity Tolerance: Patient tolerated treatment well Patient left: in chair;with call bell/phone within reach    [place], [place] [phone] 08/22/2012

## 2012-08-22 NOTE — Progress Notes (Signed)
PT recommends home health PT and rolling walker.  Discussed with pt who declines referral - requests CM wait until he is ready for discharge - states he feels he will need neither one.

## 2012-08-22 NOTE — Progress Notes (Signed)
CARDIAC REHAB PHASE I   PRE:  Rate/Rhythm: 67SR  BP:  Supine:   Sitting: 87/53  Standing:    SaO2: 99%RA  MODE:  Ambulation: 350 ft   POST:  Rate/Rhythem: 74SR  BP:  Supine:   Sitting: 116/55  Standing:    SaO2: 99%RA 1425-1445 Pt walked 350 ft on RA with rolling walker and minimal asst. C/o slight lightheadedness. BP low prior to walk. Lightheadedness did not worsen with walk and BP higher after walk. No CP. To recliner. Tolerated well.  Duanne Limerick

## 2012-08-22 NOTE — Progress Notes (Signed)
[facility]  Subjective: He has some chest soreness.  Objective: Vital signs in last 24 hours: Temp:  [97.7 F (36.5 C)-97.8 F (36.6 C)] 97.8 F (36.6 C) (10/01 0300) Pulse Rate:  [63-79] 65  (10/01 0700) Resp:  [20-25] 20  (09/30 2000) BP: (88-119)/(38-66) 106/59 mmHg (10/01 0700) SpO2:  [90 %-98 %] 94 % (10/01 0700) Last BM Date: 08/21/12  Intake/Output from previous day: 09/30 0701 - 10/01 0700 In: 1334 [P.O.:960; I.V.:220; IV Piggyback:154] Out: 2655 [Urine:2100; Stool:200; Chest Tube:355] Intake/Output this shift:    Medications Current Facility-Administered Medications  Medication Dose Route Frequency Provider Last Rate Last Dose  . 0.9 %  sodium chloride infusion   Intravenous Continuous Kalman Shan, MD   500 mL at 08/21/12 1821  . acetaminophen (TYLENOL) tablet 650 mg  650 mg Oral Q4H PRN Lennette Bihari, MD   650 mg at 08/21/12 0752  . ALPRAZolam Prudy Feeler) tablet 0.25 mg  0.25 mg Oral TID PRN Abelino Derrick, PA      . amiodarone (PACERONE) tablet 400 mg  400 mg Oral Daily Stephanye Finnicum, MD   400 mg at 08/21/12 0936  . aspirin EC tablet 81 mg  81 mg Oral Daily Lennette Bihari, MD   81 mg at 08/21/12 0936  . ceFAZolin (ANCEF) IVPB 1 g/50 mL premix  1 g Intravenous Q8H Marty Heck, PHARMD   1 g at 08/22/12 0559  . diphenhydrAMINE (BENADRYL) capsule 25 mg  25 mg Oral QHS PRN Storm Frisk, MD   25 mg at 08/21/12 0022  . furosemide (LASIX) 10 MG/ML injection           . furosemide (LASIX) 10 MG/ML injection           . furosemide (LASIX) injection 20 mg  20 mg Intravenous Q8H Alyson Reedy, MD   20 mg at 08/21/12 1819  . influenza  inactive virus vaccine (FLUZONE/FLUARIX) injection 0.5 mL  0.5 mL Intramuscular Tomorrow-1000 Kalman Shan, MD   0.5 mL at 08/21/12 0953  . insulin aspart (novoLOG) injection 0-15 Units  0-15 Units Subcutaneous TID WC Lonia Farber, MD   2 Units at 08/21/12 1157  . insulin aspart (novoLOG)  injection 0-5 Units  0-5 Units Subcutaneous QHS Konstantin Zubelevitskiy, MD      . levalbuterol (XOPENEX) nebulizer solution 0.63 mg  0.63 mg Nebulization Q6H PRN Abelino Derrick, PA   0.63 mg at 08/15/12 1008  . lisinopril (PRINIVIL,ZESTRIL) tablet 2.5 mg  2.5 mg Oral Daily Denetta Fei, MD   2.5 mg at 08/21/12 0940  . magnesium oxide (MAG-OX) tablet 400 mg  400 mg Per NG tube BID Thurmon Fair, MD   400 mg at 08/21/12 2212  . metoprolol tartrate (LOPRESSOR) tablet 12.5 mg  12.5 mg Oral TID Lennette Bihari, MD   12.5 mg at 08/21/12 2212  . multivitamin with minerals tablet 1 tablet  1 tablet Oral Daily Kalman Shan, MD   1 tablet at 08/21/12 0936  . ondansetron (ZOFRAN) injection 4 mg  4 mg Intravenous Q4H PRN Chrystie Nose, MD   4 mg at 08/15/12 0207  . pantoprazole (PROTONIX) EC tablet 40 mg  40 mg Oral Q0600 Eda Paschal [place], PA   40 mg at 08/22/12 0559  . pneumococcal 23 valent vaccine (PNU-IMMUNE) injection 0.5 mL  0.5 mL Intramuscular Tomorrow-1000 Kalman Shan, MD   0.5 mL at 08/21/12 1423  . potassium chloride (K-DUR,KLOR-CON) CR tablet 30 mEq  30 mEq Oral BID Marinus Maw, MD   30 mEq at 08/21/12 2212  . Ticagrelor (BRILINTA) tablet 90 mg  90 mg Oral BID Lennette Bihari, MD   90 mg at 08/21/12 2213  . traMADol (ULTRAM) tablet 50 mg  50 mg Oral Q6H PRN Abelino Derrick, PA      . zolpidem [facility]) tablet 5 mg  5 mg Oral QHS PRN Abelino Derrick, PA      . DISCONTD: antiseptic oral rinse (BIOTENE) solution 15 mL  15 mL Mouth Rinse BID Kalman Shan, MD   15 mL at 08/21/12 0752  . DISCONTD: metoprolol tartrate (LOPRESSOR) tablet 12.5 mg  12.5 mg Oral BID Abelino Derrick, [place]      . DISCONTD: potassium chloride SA (K-DUR,KLOR-CON) CR tablet 20 mEq  20 mEq Oral BID Kalman Shan, MD   20 mEq at 08/20/12 2214  . DISCONTD: potassium chloride SA (K-DUR,KLOR-CON) CR tablet 40 mEq  40 mEq Oral Once Alyson Reedy, MD        PE: General appearance: alert, cooperative and no distress Lungs:  clear to auscultation bilaterally Heart: regular rate and rhythm, S1, S2 normal, no murmur, click, rub or gallop Extremities: No LEE Pulses: 2+ and symmetric Skin: Warm and dry Neurologic: Grossly normal  Lab Results:   Basename 08/22/12 0450 08/20/12 0400  WBC 10.5 7.8  HGB 14.2 12.1*  HCT 41.0 35.2*  PLT 301 195   BMET  Basename 08/22/12 0450 08/21/12 0430 08/20/12 0400  NA 131* 131* 131*  K 3.9 3.8 4.3  CL 91* 93* 94*  CO2 28 29 31   GLUCOSE 103* 93 107*  BUN 12 10 12   CREATININE 0.76 0.72 0.66  CALCIUM 9.2 8.9 8.4   PORTABLE CHEST - 1 VIEW  Comparison: 08/20/2012; 08/19/2012; nine of 27/2013  Findings:  Grossly unchanged cardiac silhouette and mediastinal contours.  Stable position of support apparatus. Minimal increase in small  right apical pneumothorax. Overall improved aeration of the lungs  with persistent right hilar heterogeneous opacities. No new focal  airspace opacity. No definite pleural effusion. Unchanged bones.  IMPRESSION:  1. Stable positioning of support apparatus. Minimal increase in  size of now small right apical pneumothorax.  2. Improved aeration of the lungs with persistent right perihilar  heterogeneous opacities.  Assessment/Plan  Principal Problem:  *Sustained ventricular tachycardia with associated syncope; Monomorphic Active Problems:  Hx of cancer of lung, 3 years ago treated with radiation and chemo   GERD (gastroesophageal reflux disease)  Hyperglycemia, pt has been on meds for diabetes in the past  Cardiomyopathy, ischemic, by cardiac cath 30-35% 08/13/12   CAD (coronary artery disease) 3 vessel disease  S/P coronary artery stent placement, acutely to RCA for acute Post. 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  Pleural effusion,  intubated, chest tube place 08/16/12  Hypokalemia  Plan:  No NSVT in the last 24 hours.   Oncology is recommending outpatient CT/PET.  BP and HR stable.  8L net negative since 9/28.  CXR shows improved aeration of lungs.  ASA/ amio/ ACE/ lopressor/ Brilinta.  Zoll Life vest ordered.    LOS: 9 days    HAGER, BRYAN 08/22/2012 7:48 AM  I have seen and examined the patient along with Wilburt Finlay.  I have reviewed the chart, notes and  new data.  I agree with PA's note.  Rapidly improving physically. Walked through the unit. CT still draining >300 mL/day, still very milky.   PLAN: Gently increase ACEi dose. Recheck pleural fluid triglycerides and protein now that he is eating. 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 In the absence of chest surgery/trauma, the only logical explanation for a chylothorax would be malignancy.  Thurmon Fair, MD, [facility] [phone] 08/22/2012, 8:17 AM

## 2012-08-23 ENCOUNTER — Ambulatory Visit: Payer: Self-pay | Admitting: Oncology

## 2012-08-23 DIAGNOSIS — I898 Other specified noninfective disorders of lymphatic vessels and lymph nodes: Secondary | ICD-10-CM

## 2012-08-23 LAB — GLUCOSE, CAPILLARY
Glucose-Capillary: 115 mg/dL — ABNORMAL HIGH (ref 70–99)
Glucose-Capillary: 134 mg/dL — ABNORMAL HIGH (ref 70–99)
Glucose-Capillary: 131 mg/dL — ABNORMAL HIGH (ref 70–99)

## 2012-08-23 LAB — BASIC METABOLIC PANEL
CO2: 28 mEq/L (ref 19–32)
Chloride: 94 mEq/L — ABNORMAL LOW (ref 96–112)
Potassium: 3.8 mEq/L (ref 3.5–5.1)
Sodium: 131 mEq/L — ABNORMAL LOW (ref 135–145)

## 2012-08-23 LAB — MAGNESIUM: Magnesium: 2.3 mg/dL (ref 1.5–2.5)

## 2012-08-23 LAB — PHOSPHORUS: Phosphorus: 4 mg/dL (ref 2.3–4.6)

## 2012-08-23 LAB — CBC
Platelets: 299 10*3/uL (ref 150–400)
RBC: 4.45 MIL/uL (ref 4.22–5.81)
WBC: 9.2 10*3/uL (ref 4.0–10.5)

## 2012-08-23 NOTE — Progress Notes (Signed)
CARDIAC REHAB PHASE I   PRE:  Rate/Rhythm: 67SR  BP:  Supine:   Sitting: 100/51  Standing:    SaO2: 97%RA  MODE:  Ambulation: 700 ft   POST:  Rate/Rhythem: 77SR  BP:  Supine:   Sitting: 122/60  Standing:    SaO2: 94%RA 1025-1050 Pt walked 700 ft on RA with rolling walker with steady gait. Tolerated well. To recliner after walk. Call bell in reach. No c/o dizziness today.  Duanne Limerick

## 2012-08-23 NOTE — Progress Notes (Signed)
Subjective:  Up in chair.  Objective:  Vital Signs in the last 24 hours: Temp:  [97.6 F (36.4 C)-98.6 F (37 C)] 98 F (36.7 C) (10/02 0754) Pulse Rate:  [65-69] 68  (10/02 0754) Resp:  [16] 16  (10/02 0754) BP: (83-105)/(52-63) 91/54 mmHg (10/02 0754) SpO2:  [92 %-100 %] 95 % (10/02 0754) Weight:  [98.3 kg (216 lb 11.4 oz)] 98.3 kg (216 lb 11.4 oz) (10/02 0600)  Intake/Output from previous day:  Intake/Output Summary (Last 24 hours) at 08/23/12 0851 Last data filed at 08/23/12 0700  Gross per 24 hour  Intake    770 ml  Output   1430 ml  Net   -660 ml    Physical Exam: General appearance: alert, cooperative and no distress Lungs: chest tube on Rt Heart: regular rate and rhythm   Rate: 68  Rhythm: normal sinus rhythm  Lab Results:  Basename 08/23/12 0420 08/22/12 0450  WBC 9.2 10.5  HGB 13.9 14.2  PLT 299 301    Basename 08/23/12 0420 08/22/12 0450  NA 131* 131*  K 3.8 3.9  CL 94* 91*  CO2 28 28  GLUCOSE 99 103*  BUN 11 12  CREATININE 0.70 0.76   No results found for this basename: TROPONINI:2,CK,MB:2 in the last 72 hours Hepatic Function Panel  Basename 08/22/12 0840  PROT 6.1  ALBUMIN --  AST --  ALT --  ALKPHOS --  BILITOT --  BILIDIR --  IBILI --    Basename 08/22/12 0840  CHOL 156   No results found for this basename: INR in the last 72 hours  Imaging: Imaging results have been reviewed  Cardiac Studies:  Assessment/Plan:   Principal Problem:  *Sustained ventricular tachycardia with associated syncope; Monomorphic Active Problems:  CAD (coronary artery disease) 3 vessel disease  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  Pleural effusion, intubated, chest tube place 08/16/12  Hx of cancer of lung, 3 years ago treated with radiation and chemo   Cardiomyopathy, ischemic, by cardiac cath 30-35% 08/13/12   S/P coronary artery stent placement, acutely to RCA for acute Post. MI with BMS, 08/13/12  GERD (gastroesophageal  reflux disease)  Hyperglycemia, pt has been on meds for diabetes in the past  Pacemaker, temporary placed 08/13/12  Hypoxemia  Acute respiratory failure, extubated 08/19/12  Pneumonia, organism unspecified  Shock - on admission 08/13/12  Hypokalemia   Plan- Dr Margriat's assistance appreciated. The pt will follow up with his oncologist in [place] after discharge.  His chest tube drainage is less. The pt will need a life vest at discharge. His B/P is soft, will hold ACE for systolic B/P less than 100.  Follow BMP.   Corine Shelter PA-C 08/23/2012, 8:51 AM

## 2012-08-23 NOTE — Progress Notes (Signed)
Name: [name] [name] MRN: [id number] DOB: October 20, 1956    LOS: 10  Referring Provider:  [name] (cards) Reason for Referral:  Recurrent VT, hx small cell lung ca  PULMONARY / CRITICAL CARE MEDICINE  HPI:  56 yo male smoker admitted 08/13/2012 with syncope and sustained VT with STEMI.  He had emergent cath with PCI/stent and pacer.  Noted to have abnormal CXR with hx of SCLC (Tx 3 yrs ago) and PCCM consulted 9/23.  Developed respiratory distress with VDRF and found to have PNA and moderate Rt pleural effusion on CT chest.  Pleural fluid was consistent with empyema and chest tube placed. PMHx systolic CHF (EF 40%)  Lines/tubes: ETT 9/24>> 08/19/12 Rt IJ CVL 9/24>> Rt chest tube 9/24>>  Cultures: MRSA PCR 9/24 - NEGATIVE Sputum 9/24>>Few S aureus (MSSA) Blood 9/24>>Coag neg Staph Rt pleural fluid 9/24>> Urine 9/24>>negative     Antiotics: Vancomycin 9/24>>08/20/12 Zosyn 9/24>>08/20/12 Ancef 08/20/12 (MSSA sputum) >> (4 more days)  Tests/events: 9//23 Echo>>EF 25 to 30% 9/24 VDRF, pulmonary infiltrates, pleural effusion, chest tube 9/24 Rt pleural fluid>>protein 2.9, LDH 124, glucose 153, WBC 1452 (3N, 89L, 58M, 0E).  Cytology negative for malignancy 9/24 CT chest>>patchy perihilar ASD b/l, moderate right pleural effusion 9/27 - pacer sheath removed 9/28 - extubated. Off pressors 9/29 - chylous pleural  noticed  Subjective: Extubated doing well. Wants to eat. Deconditioned  300cc chylous pleural fluid out of chest tube in 12h  Continues to have relatively frequent, very premature PVCs with fixed coupling interval and R-on-T appearance, but overall burden appears to be lower and no runs seen today.   Vital Signs: Temp:  [97.6 F (36.4 C)-98.6 F (37 C)] 97.6 F (36.4 C) (10/02 1107) Pulse Rate:  [65-70] 70  (10/02 1107) Resp:  [16] 16  (10/02 0754) BP: (83-103)/(52-63) 92/54 mmHg (10/02 1107) SpO2:  [92 %-100 %] 96 % (10/02 1107) Weight:  [98.3 kg (216 lb 11.4 oz)] 98.3 kg  (216 lb 11.4 oz) (10/02 0600)  I/O last 3 completed shifts: In: 1350 [P.O.:1200; IV Piggyback:150] Out: 2155 [Urine:1800; Stool:100; Chest Tube:255]    Physical Examination: General - no distress HEENT - [place]/AT, PERRL, EOM-I, -LAN and -thyromegally. Cardiac - s2 s2 regular.  Chest - no wheeze, Rt chest tube in place Abd - soft, non tender Ext - no edema Neuro - Alert and oriented x3, walking the halls. Dg Chest Port 1 View  08/22/2012  *RADIOLOGY REPORT*  Clinical Data: Evaluate chest tube positioning  PORTABLE CHEST - 1 VIEW  Comparison: 08/20/2012; 08/19/2012; nine of 27/2013  Findings:  Grossly unchanged cardiac silhouette and mediastinal contours. Stable position of support apparatus.  Minimal increase in small right apical pneumothorax. Overall improved aeration of the lungs with persistent right hilar heterogeneous opacities.  No new focal airspace opacity.  No definite pleural effusion.  Unchanged bones.  IMPRESSION: 1.  Stable positioning of support apparatus.  Minimal increase in size of now small right apical pneumothorax.  2.  Improved aeration of the lungs with persistent right perihilar heterogeneous opacities.   Original Report Authenticated By: Waynard Reeds, M.D.     BMET Lab Results  Component Value Date   CREATININE 0.70 08/23/2012   BUN 11 08/23/2012   NA 131* 08/23/2012   K 3.8 08/23/2012   CL 94* 08/23/2012   CO2 28 08/23/2012    CBC Lab Results  Component Value Date   WBC 9.2 08/23/2012   HGB 13.9 08/23/2012   HCT 39.3 08/23/2012  MCV 88.3 08/23/2012   PLT 299 08/23/2012   Lab Results  Component Value Date   ALT 8 08/17/2012   AST 12 08/17/2012   ALKPHOS 42 08/17/2012   BILITOT 0.6 08/17/2012   BNP    Component Value Date/Time   PROBNP 2216.0* 08/21/2012 0930   ABG    Component Value Date/Time   PHART 7.498* 08/17/2012 0447   PCO2ART 36.4 08/17/2012 0447   PO2ART 125.0* 08/17/2012 0447   HCO3 28.1* 08/17/2012 0447   TCO2 29 08/17/2012 0447   O2SAT 99.0  08/17/2012 0447   CBG (last 3)   Basename 08/23/12 0756 08/22/12 2210 08/22/12 1614  GLUCAP 115* 105* 114*     ASSESSMENT AND PLAN  A: Acute respiratory failure with pulmonary infiltrates, and right pleural effusion.  Rt pleural effusion noted to appear purulent, but numbers show transudate with predominance of lymphocytes and relatively high trigs (cut off for chylothorax is 110 and patient's is 97 but without eating that number can be falsely lowered), and cytology negative for malignancy.  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  P: Incentive spirometry, pulmonary toilet. Will speak with IR to see if they can perform an lymphangiogram with coiling. CVTS consult called.  A: Shock ?cardiogenic vs septic.  - Resolved, now borderline BP due to medications.  P: Recommend d/c or lowering ACE to void hypotension.  A: Recurrent VT with STEMI.  Systolic CHF.  Improved.  P Amio to PO. Per cardiology and EP cardiology.  A: RENAL and lytes  Lab 08/23/12 0420 08/22/12 0450 08/21/12 0430 08/20/12 0400 08/19/12 0500 08/18/12 0440  NA 131* 131* 131* 131* 132* --  K 3.8 3.9 -- -- -- --  CL 94* 91* 93* 94* 93* --  CO2 28 28 29 31 31  --  GLUCOSE 99 103* 93 107* 98 --  BUN 11 12 10 12 12  --  CREATININE 0.70 0.76 0.72 0.66 0.67 --  CALCIUM 9.2 9.2 8.9 8.4 8.5 --  MG 2.3 2.3 2.4 2.4 -- 1.9  PHOS 4.0 4.2 4.0 3.7 -- --    Intake/Output Summary (Last 24 hours) at 08/23/12 1126 Last data filed at 08/23/12 0900  Gross per 24 hour  Intake   1010 ml  Output   1430 ml  Net   -420 ml    on 9/29: Mild hyponatremia P: Goal K > 4; so replete Goal mag > 2; recheck f/u BMET Continue to hold additional lasix.  A: Hyperglycemia. P: Change to Ac and HS on 9/29  A: INFECTIOUS DISEASES No results found for this basename: PROCALCITON:5 in the last 168 hours MSSA in sputum P Changed abx to Ancef on 08/20/12 and finish 3 more days for total 8 day  antibiotics  A: Nutrition. 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  If recommended by CVTS after seeing patient will make NPO and start TPN.  Transfer to SDU, PCCM will follow.  Alyson Reedy, M.D. [facility] Pulmonary/Critical Care Medicine. Pager: [phone]. After hours pager: [phone].

## 2012-08-23 NOTE — Consult Note (Signed)
[address]            [place] [address]          [phone]      Reason for Consult: Chylothorax  Referring Physician:  Dr. Annie Paras [name] [name] is an 56 y.o. male.  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 The diagnosis was made by biopsy of an enlarged left scalene lymph node. He said that he was treated with chemotherapy and radiation therapy at [facility] by Dr. Doylene Canning and Dr. Aggie Cosier. He did have brain irradiation. He said that he was told that he was cured and has not gone back to see those doctors for about the past 2 years. He said he quit smoking for about a year but then started smoking again and is still smoking 2-3 packs per day. He was admitted with syncope and sustained ventricular tachycardia on 9/22 while doing paperwork. He was diagnosed with acute ST-elevation myocardial infarction and was taken the catheterization lab where he was found to have severe multivessel disease with an acutely occluded right coronary artery. This was opened with a bare metal stent. His chest x-ray on admission showed no evidence of pleural effusion on either side. There was mediastinal and hilar fullness. After his PCI he developed respiratory failure requiring intubation. CT scan of the chest was performed without IV contrast and this showed some right hilar fullness although it is difficult to tell if there is adenopathy due to perihilar pulmonary consolidation and the lack of contrast. There was also a new moderate right pleural effusion and a small left pleural effusion. The patient had a right chest tube inserted by  pulmonary medicine which drained milky fluid. The fluid had an elevated triglyceride level consistent with chylothorax. The chest tube drained 1300 cc initially on 08/15/2012 and then this drained 350-550 cc daily since. The patient said he continues to smoke about 2-3 packs of cigarettes per day. He does note some chest discomfort prior to this syncopal episode. He has noticed a decrease in his energy level. He has a chronic cough but denies any hemoptysis or significant sputum production. His appetite has been good and his weight has been stable. 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  Past Medical History  Diagnosis Date  . Cancer     lung  . STEMI (ST elevation myocardial infarction) 08/13/2012  . Sustained ventricular tachycardia with associated syncope 08/13/2012  . Hx of cancer of lung, 3 years ago treated with radiation and chemo and resolved 08/13/2012  . GERD (gastroesophageal reflux disease) 08/13/2012  . Hyperglycemia 08/13/2012  . Cardiomyopathy, ischemic, by cardiac cath 30-35% 08/13/12  08/13/2012  . CAD (coronary artery disease) 3 vessel disease 08/13/2012  . S/P coronary artery stent placement, acutely to RCA for acute Post. MI with BMS, 08/13/12 08/13/2012    Past Surgical History  Procedure Date  . No past surgeries   . Cardiac catheterization     History reviewed. No pertinent family history.  Social History:  reports that  he has been smoking.  He does not have any smokeless tobacco history on file. He reports that he does not drink alcohol or use illicit drugs.  Allergies: No Known Allergies  Medications:  I have reviewed the patient's current medications. Prior to Admission:  Prescriptions prior to admission  Medication Sig Dispense Refill  . ibuprofen (ADVIL,MOTRIN) 200 MG tablet Take 600 mg by mouth every 6 (six) hours as needed. For pain      . omeprazole (PRILOSEC) 20 MG capsule Take 20 mg by mouth daily as needed. For heartburn       Scheduled:    . amiodarone  400 mg Oral Daily  . aspirin EC  81 mg Oral Daily  .  ceFAZolin (ANCEF) IV  1 g Intravenous Q8H  . insulin aspart  0-15 Units Subcutaneous TID WC  . insulin aspart  0-5 Units Subcutaneous QHS  . lisinopril  5 mg Oral Daily  . magnesium oxide  400 mg Per NG tube BID  . metoprolol tartrate  12.5 mg Oral TID  . multivitamin with minerals  1 tablet Oral Daily  . pantoprazole  40 mg Oral Q0600  . potassium chloride  30 mEq Oral BID  . Ticagrelor  90 mg Oral BID   Continuous:   . sodium chloride Stopped (08/21/12 1900)   ZOX:[id number], ALPRAZolam, diphenhydrAMINE, levalbuterol, ondansetron (ZOFRAN) IV, traMADol, zolpidem  Results for orders placed during the hospital encounter of 08/13/12 (from the past 48 hour(s))  GLUCOSE, CAPILLARY     Status: Abnormal   Collection Time   08/21/12  4:58 PM      Component Value Range Comment   Glucose-Capillary 104 (*) 70 - 99 mg/dL   GLUCOSE, CAPILLARY     Status: Abnormal   Collection Time   08/21/12 10:10 PM      Component Value Range Comment   Glucose-Capillary 104 (*) 70 - 99 mg/dL   BASIC METABOLIC PANEL     Status: Abnormal   Collection Time   08/22/12  4:50 AM      Component Value Range Comment   Sodium 131 (*) 135 - 145 mEq/L    Potassium 3.9  3.5 - 5.1 mEq/L    Chloride 91 (*) 96 - 112 mEq/L    CO2 28  19 - 32 mEq/L    Glucose, Bld 103 (*) 70 - 99 mg/dL    BUN 12  6 - 23 mg/dL    Creatinine, Ser 1.47  0.50 - 1.35 mg/dL    Calcium 9.2  8.4 - 82.9 mg/dL    GFR calc non Af Amer >90  >90 mL/min    GFR calc Af Amer >90  >90 mL/min   MAGNESIUM     Status: Normal   Collection Time   08/22/12  4:50 AM      Component Value Range Comment   Magnesium 2.3  1.5 - 2.5 mg/dL   CBC     Status: Normal   Collection Time   08/22/12  4:50 AM      Component Value Range Comment   WBC 10.5  4.0 - 10.5 K/uL    RBC 4.60  4.22 - 5.81 MIL/uL    Hemoglobin 14.2  13.0 - 17.0 g/dL    HCT 56.2  13.0 - 86.5 %    MCV 89.1  78.0 - 100.0  fL    MCH 30.9  26.0 - 34.0 pg    MCHC 34.6  30.0 - 36.0 g/dL    RDW  13.5  11.5 - 15.5 %    Platelets 301  150 - 400 K/uL   PHOSPHORUS     Status: Normal   Collection Time   08/22/12  4:50 AM      Component Value Range Comment   Phosphorus 4.2  2.3 - 4.6 mg/dL   GLUCOSE, CAPILLARY     Status: Abnormal   Collection Time   08/22/12  7:43 AM      Component Value Range Comment   Glucose-Capillary 101 (*) 70 - 99 mg/dL   TRIGLYCERIDES, BODY FLUIDS     Status: Normal   Collection Time   08/22/12  8:37 AM      Component Value Range Comment   Triglycerides, Fluid 476      Fluid Type-FTRIG     CORRECTED ON 10/01 AT 1345: PREVIOUSLY REPORTED AS FLUID RIGHT PLEURAL CORRECTED ON 10/01 AT 0908: PREVIOUSLY REPORTED AS PLEURAL   Value: CORRECTED ON 10/01 AT 1610: PREVIOUSLY REPORTED AS PLEURAL  PROTEIN, BODY FLUID     Status: Normal   Collection Time   08/22/12  8:37 AM      Component Value Range Comment   Total protein, fluid 3.8   NO NORMAL RANGE ESTABLISHED FOR THIS TEST   Fluid Type-FTP FLUID     LACTATE DEHYDROGENASE     Status: Normal   Collection Time   08/22/12  8:40 AM      Component Value Range Comment   LDH 237  94 - 250 U/L   PROTEIN, TOTAL     Status: Normal   Collection Time   08/22/12  8:40 AM      Component Value Range Comment   Total Protein 6.1  6.0 - 8.3 g/dL   CHOLESTEROL, TOTAL     Status: Normal   Collection Time   08/22/12  8:40 AM      Component Value Range Comment   Cholesterol 156  0 - 200 mg/dL   TRIGLYCERIDES     Status: Normal   Collection Time   08/22/12  8:40 AM      Component Value Range Comment   Triglycerides 128  <150 mg/dL   LACTATE DEHYDROGENASE, BODY FLUID     Status: Abnormal   Collection Time   08/22/12  8:55 AM      Component Value Range Comment   LD, Fluid 395 (*) 3 - 23 U/L    Fluid Type-FLDH FLUID     PROTEIN, BODY FLUID     Status: Normal   Collection Time   08/22/12  8:55 AM      Component Value Range Comment   Total protein, fluid 3.8    NO NORMAL RANGE ESTABLISHED FOR THIS TEST   Fluid Type-FTP FLUID     BODY FLUID CELL COUNT WITH DIFFERENTIAL     Status: Abnormal   Collection Time   08/22/12  8:55 AM      Component Value Range Comment   Fluid Type-FCT FLUID      Color, Fluid YELLOW  YELLOW    Appearance, Fluid TURBID (*) CLEAR    WBC, Fluid 5670 (*) 0 - 1000 cu mm    Neutrophil Count, Fluid 23  0 - 25 %    Lymphs, Fluid 63      Monocyte-Macrophage-Serous Fluid 10 (*) 50 - 90 %    Eos, Fluid 4      Other Cells, Fluid RARE MESOTHELIAL CELL.     CHOLESTEROL, BODY FLUID     Status:  Normal   Collection Time   08/22/12  8:55 AM      Component Value Range Comment   Cholesterol, Fluid 75      Chol, Fluid Type     CORRECTED ON 10/01 AT 1435: PREVIOUSLY REPORTED AS FLUID RIGHT PLEURAL CORRECTED ON 10/01 AT 0909: PREVIOUSLY REPORTED AS PLEURAL   Value: CORRECTED ON 10/01 AT 1610: PREVIOUSLY REPORTED AS PLEURAL  GLUCOSE, CAPILLARY     Status: Abnormal   Collection Time   08/22/12 11:46 AM      Component Value Range Comment   Glucose-Capillary 127 (*) 70 - 99 mg/dL   GLUCOSE, CAPILLARY     Status: Abnormal   Collection Time   08/22/12  4:14 PM      Component Value Range Comment   Glucose-Capillary 114 (*) 70 - 99 mg/dL   GLUCOSE, CAPILLARY     Status: Abnormal   Collection Time   08/22/12 10:10 PM      Component Value Range Comment   Glucose-Capillary 105 (*) 70 - 99 mg/dL   CBC     Status: Normal   Collection Time   08/23/12  4:20 AM      Component Value Range Comment   WBC 9.2  4.0 - 10.5 K/uL    RBC 4.45  4.22 - 5.81 MIL/uL    Hemoglobin 13.9  13.0 - 17.0 g/dL    HCT 96.0  45.4 - 09.8 %    MCV 88.3  78.0 - 100.0 fL    MCH 31.2  26.0 - 34.0 pg    MCHC 35.4  30.0 - 36.0 g/dL    RDW 11.9  14.7 - 82.9 %    Platelets 299  150 - 400 K/uL   BASIC METABOLIC PANEL     Status: Abnormal   Collection Time   08/23/12  4:20 AM      Component Value Range Comment   Sodium 131 (*) 135 - 145 mEq/L    Potassium 3.8  3.5 - 5.1  mEq/L    Chloride 94 (*) 96 - 112 mEq/L    CO2 28  19 - 32 mEq/L    Glucose, Bld 99  70 - 99 mg/dL    BUN 11  6 - 23 mg/dL    Creatinine, Ser 5.62  0.50 - 1.35 mg/dL    Calcium 9.2  8.4 - 13.0 mg/dL    GFR calc non Af Amer >90  >90 mL/min    GFR calc Af Amer >90  >90 mL/min   MAGNESIUM     Status: Normal   Collection Time   08/23/12  4:20 AM      Component Value Range Comment   Magnesium 2.3  1.5 - 2.5 mg/dL   PHOSPHORUS     Status: Normal   Collection Time   08/23/12  4:20 AM      Component Value Range Comment   Phosphorus 4.0  2.3 - 4.6 mg/dL   GLUCOSE, CAPILLARY     Status: Abnormal   Collection Time   08/23/12  7:56 AM      Component Value Range Comment   Glucose-Capillary 115 (*) 70 - 99 mg/dL   GLUCOSE, CAPILLARY     Status: Abnormal   Collection Time   08/23/12 11:10 AM      Component Value Range Comment   Glucose-Capillary 134 (*) 70 - 99 mg/dL     Dg Chest Port 1 View  08/22/2012  *RADIOLOGY REPORT*  Clinical Data: Evaluate chest  tube positioning  PORTABLE CHEST - 1 VIEW  Comparison: 08/20/2012; 08/19/2012; nine of 27/2013  Findings:  Grossly unchanged cardiac silhouette and mediastinal contours. Stable position of support apparatus.  Minimal increase in small right apical pneumothorax. Overall improved aeration of the lungs with persistent right hilar heterogeneous opacities.  No new focal airspace opacity.  No definite pleural effusion.  Unchanged bones.  IMPRESSION: 1.  Stable positioning of support apparatus.  Minimal increase in size of now small right apical pneumothorax.  2.  Improved aeration of the lungs with persistent right perihilar heterogeneous opacities.   Original Report Authenticated By: Waynard Reeds, M.D.     Review of Systems  Constitutional: Positive for malaise/fatigue. Negative for fever, chills, weight loss and diaphoresis.  HENT: Negative.   Eyes: Negative.   Respiratory: Positive for cough. Negative for hemoptysis, sputum production, shortness  of breath and wheezing.   Cardiovascular: Positive for chest pain. Negative for palpitations, orthopnea, claudication, leg swelling and PND.  Gastrointestinal: Negative.   Genitourinary: Negative.   Musculoskeletal: Positive for joint pain.  Skin: Negative.   Neurological: Negative for weakness.       Nothing prior to syncopal episode.  Endo/Heme/Allergies: Negative.   Psychiatric/Behavioral: Negative.    Blood pressure 92/54, pulse 70, temperature 97.6 F (36.4 C), temperature source Oral, resp. rate 17, height 6' (1.829 m), weight 98.3 kg (216 lb 11.4 oz), SpO2 96.00%. Physical Exam  Constitutional: He is oriented to person, place, and time. He appears well-developed and well-nourished. No distress.  HENT:  Head: Normocephalic and atraumatic.  Mouth/Throat: Oropharynx is clear and moist.  Eyes: Conjunctivae normal and EOM are normal. Pupils are equal, round, and reactive to light. No scleral icterus.  Neck: Normal range of motion. Neck supple. No JVD present. No tracheal deviation present. No thyromegaly present.       No supraclavicular adenopathy. Old scar in left supraclavicular region from scalene lymph node bx.  Cardiovascular: Normal rate, regular rhythm, normal heart sounds and intact distal pulses.  Exam reveals no gallop and no friction rub.   No murmur heard. Respiratory: Effort normal. No respiratory distress.       bilat expiratory rhonchi  GI: Soft. Bowel sounds are normal. He exhibits no distension and no mass. There is no tenderness.  Musculoskeletal: Normal range of motion. He exhibits no edema.  Lymphadenopathy:    He has no cervical adenopathy.  Neurological: He is alert and oriented to person, place, and time. He has normal strength. No cranial nerve deficit or sensory deficit.  Skin: Skin is warm and dry.  Psychiatric: He has a normal mood and affect.   Emergent Cardiac Catheterization/ Temporary Pacemaker/PTCA-Stenting of RCA  Daphine Deutscher, 56 y.o.,  male  Full note dictated; See diagram in chart  DICTATION [phone] , [id number]  Ao: 89/56 LV 89/8/22  LM: nl LAD: ostial occlusion RI: patent LCx: 70% prox, 50% mid stenosis RCA: 70% prox, 50% mid; acute occlusion post crux with Timi 0 flow  Successful PTCA/PCI/Stent witn BM Vision 2.75 x 28 mm stent post dilated to 3.1 to 2.8 mm taper into PDA proximally  EF: 30 - 35% with significant anterolateral and inferior hypocontractility.  Pacemaker inserted into RV  With paced rhythm stabilization of recurrent VT.   Lennette Bihari, MD, [facility]    *RADIOLOGY REPORT*   Clinical Data: Myocardial infarction with extreme shortness of breath.  History of lung cancer.   CT CHEST WITHOUT CONTRAST   Technique:  Multidetector CT imaging of  the chest was performed following the standard protocol without IV contrast.   Comparison: Radiographs 08/13/2012.  Chest CT 12/31/2008.   Findings: Within the limitations of noncontrast technique, no residual discretely enlarged left hilar or mediastinal lymph nodes are demonstrated.  Right hilar assessment is limited by adjacent airspace disease.   There are moderate right and small left pleural effusions.  There are patchy airspace opacities with air bronchograms in both lungs. On the right, there is central perihilar component as well as a more peripheral component in the right upper lobe.  On the left, there are scattered components in the upper and lower lobes. There is some central airway narrowing bilaterally.   A femoral pacemaker is noted.  There is atherosclerosis of the coronary arteries.  The visualized upper abdomen appears unremarkable.  There are no acute or suspicious osseous findings.   IMPRESSION:   1.  Moderate right and small left pleural effusions with associated dependent atelectasis in both lungs. 2.  Patchy perihilar airspace opacities in both lungs have developed over the last several days and are suspicious  for pneumonia. 3.  A residual right hilar mass is difficult to exclude on this noncontrast examination given the adjacent airspace disease. Follow-up imaging or bronchoscopy may be warranted.     Original Report Authenticated By: Gerrianne Scale, M.D.  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 I am most concerned about the possibility of recurrent cancer as a cause of this. It is perplexing that when he was admitted with his acute MI on 9/22 his cxr did not show any pleural effusion. Then he had a moderate right pleural effusion 2 days later on CT scan. There are two issues here. First is to be sure he doesn't have recurrent lung cancer which would be the most likely cause. He needs a CT of the chest with contrast and probably a PET scan. The second issue is to resolve the chylothorax. I would recommend starting TNA and keeping him NPO which will have the best chance of resolving this. We usually try this for 1-2 weeks depending on chest tube output. Octreotide may be of adjunctive help. If this is due to recurrent cancer then it is very difficult to resolve. Sometimes further chemo or xrt for the cancer will help if possible but sometimes it requires surgery for thoracic duct ligation and pleurectomy. This may still not be effective.  I will order CT scan. His primary team can start TNA and make NPO. I will follow closely. I discussed all of this with the patient and wife.    Naliya Gish K 08/23/2012, 4:02 PM

## 2012-08-23 NOTE — Progress Notes (Signed)
Mr. Janssens second pleural cytology was nondiagnostic.  I have discussed his situation with his prior oncologist, Dr. Doylene Canning in [place]. The patient has an appointment with him OCT 10 at 2:30 PM. Both the patient and his wife have this information. I have given the patient a disc with his CT scan so Dr Doylene Canning can compare with prior films there to decide whether a PET scan or other evaluation is called for.  Will sign off at this point. Please let me know if I can be of further help.

## 2012-08-23 NOTE — Progress Notes (Signed)
Pt. Seen and examined. Agree with the NP/PA-C note as written. No complaints today. Ambulating more. Still draining milky white fluid in the chest tube, but output is less. Will need LifeVest prior to discharge - question about permanent AICD to be addressed after 90 day period +/- final determination of lung mass as to whether it is malignant or not.   Chrystie Nose, MD, [facility] Attending Cardiologist [facility]

## 2012-08-23 NOTE — Progress Notes (Signed)
Physical Therapy Treatment Patient Details Name: [name] [name] MRN: [id number] DOB: 1956-08-31 Today's Date: 08/23/2012 Time: 4098-1191 PT Time Calculation (min): 27 min  PT Assessment / Plan / Recommendation Comments on Treatment Session  Cont's to move very well.      Follow Up Recommendations  Home health PT;Supervision - Intermittent    Barriers to Discharge        Equipment Recommendations  Rolling walker with 5" wheels    Recommendations for Other Services    Frequency Min 3X/week   Plan Discharge plan remains appropriate    Precautions / Restrictions Precautions Precautions: Fall Restrictions Weight Bearing Restrictions: No       Mobility  Bed Mobility Bed Mobility: Not assessed Transfers Transfers: Sit to Stand;Stand to Sit Sit to Stand: 5: Supervision;With upper extremity assist;From toilet;From chair/3-in-1;With armrests Stand to Sit: With upper extremity assist;With armrests;To chair/3-in-1;6: Modified independent (Device/Increase time) Details for Transfer Assistance: supervision for safety.   Ambulation/Gait Ambulation/Gait Assistance: 5: Supervision Ambulation Distance (Feet): 700 Feet Assistive device: Rolling walker Ambulation/Gait Assistance Details: ambulated around unit 2x's.  Pt states he is barely having to use UE support on RW.  Pt may be at appropriate level to attempt wihtout RW.  Gait Pattern: Within Functional Limits Stairs: No Wheelchair Mobility Wheelchair Mobility: No      PT Goals Acute Rehab PT Goals Time For Goal Achievement: 09/04/12 Potential to Achieve Goals: Good Pt will go Supine/Side to Sit: Independently Pt will go Sit to Stand: Independently PT Goal: Sit to Stand - Progress: Progressing toward goal Pt will Ambulate: >150 feet;with modified independence;with least restrictive assistive device PT Goal: Ambulate - Progress: Progressing toward goal Pt will Go Up / Down Stairs: 3-5 stairs;with supervision;with least  restrictive assistive device  Visit Information  Last PT Received On: 08/23/12 Assistance Needed: +1    Subjective Data      Cognition  Overall Cognitive Status: Appears within functional limits for tasks assessed/performed Arousal/Alertness: Awake/alert Orientation Level: Appears intact for tasks assessed Behavior During Session: [facility] for tasks performed    Balance     End of Session PT - End of Session Equipment Utilized During Treatment: Gait belt Activity Tolerance: Patient tolerated treatment well Patient left: in chair;with call bell/phone within reach Nurse Communication: Mobility status     Verdell Face, [place] [phone] 08/23/2012

## 2012-08-24 ENCOUNTER — Inpatient Hospital Stay (HOSPITAL_COMMUNITY): Payer: BC Managed Care – PPO

## 2012-08-24 DIAGNOSIS — I898 Other specified noninfective disorders of lymphatic vessels and lymph nodes: Secondary | ICD-10-CM

## 2012-08-24 LAB — GLUCOSE, CAPILLARY
Glucose-Capillary: 119 mg/dL — ABNORMAL HIGH (ref 70–99)
Glucose-Capillary: 129 mg/dL — ABNORMAL HIGH (ref 70–99)

## 2012-08-24 LAB — BASIC METABOLIC PANEL
BUN: 13 mg/dL (ref 6–23)
CO2: 27 mEq/L (ref 19–32)
Calcium: 9.1 mg/dL (ref 8.4–10.5)
Chloride: 92 mEq/L — ABNORMAL LOW (ref 96–112)
Creatinine, Ser: 0.71 mg/dL (ref 0.50–1.35)
GFR calc Af Amer: >90 mL/min (ref >90)
GFR calc non Af Amer: >90 mL/min (ref >90)
Glucose, Bld: 93 mg/dL (ref 70–99)
Potassium: 4.1 mEq/L (ref 3.5–5.1)
Sodium: 128 mEq/L — ABNORMAL LOW (ref 135–145)

## 2012-08-24 LAB — BODY FLUID CULTURE: Culture: NO GROWTH

## 2012-08-24 MED ORDER — SODIUM CHLORIDE 0.9 % IJ SOLN
10.0000 mL | Freq: Two times a day (BID) | INTRAMUSCULAR | Status: DC
  Administered 2012-08-25: 40 mL
  Administered 2012-08-26 – 2012-08-27 (×2): 20 mL

## 2012-08-24 MED ORDER — SODIUM CHLORIDE 0.9 % IJ SOLN
10.0000 mL | INTRAMUSCULAR | Status: DC | PRN
  Administered 2012-08-24 – 2012-08-25 (×2): 30 mL
  Administered 2012-08-25 – 2012-08-26 (×2): 10 mL
  Administered 2012-08-28: 20 mL
  Administered 2012-08-29 – 2012-08-30 (×6): 10 mL
  Administered 2012-08-31: 20 mL
  Administered 2012-09-01 – 2012-09-06 (×13): 10 mL

## 2012-08-24 MED ORDER — IOHEXOL 300 MG/ML  SOLN
80.0000 mL | Freq: Once | INTRAMUSCULAR | Status: AC | PRN
  Administered 2012-08-24: 80 mL via INTRAVENOUS

## 2012-08-24 MED ORDER — CLINIMIX E/DEXTROSE (5/15) 5 % IV SOLN
INTRAVENOUS | Status: AC
  Administered 2012-08-24: 18:00:00 via INTRAVENOUS
  Filled 2012-08-24: qty 1000

## 2012-08-24 MED ORDER — INSULIN ASPART 100 UNIT/ML ~~LOC~~ SOLN
0.0000 [IU] | SUBCUTANEOUS | Status: DC
  Administered 2012-08-24: 2 [IU] via SUBCUTANEOUS
  Administered 2012-08-25: 3 [IU] via SUBCUTANEOUS
  Administered 2012-08-25 – 2012-08-26 (×4): 2 [IU] via SUBCUTANEOUS

## 2012-08-24 NOTE — Progress Notes (Addendum)
11 Days Post-Op Procedure(s) (LRB): LEFT HEART CATHETERIZATION WITH CORONARY ANGIOGRAM (N/A) TEMPORARY PACEMAKER INSERTION (Right) PERCUTANEOUS CORONARY STENT INTERVENTION (PCI-S) (Right) Subjective: No complaints  Objective: Vital signs in last 24 hours: Temp:  [97.7 F (36.5 C)-98.7 F (37.1 C)] 97.8 F (36.6 C) (10/03 1200) Pulse Rate:  [61-73] 62  (10/03 1200) Cardiac Rhythm:  [-] Normal sinus rhythm (10/03 1200) Resp:  [17-18] 18  (10/03 1200) BP: (92-104)/(53-62) 99/57 mmHg (10/03 1200) SpO2:  [96 %-98 %] 97 % (10/03 1200) Weight:  [98.6 kg (217 lb 6 oz)] 98.6 kg (217 lb 6 oz) (10/03 0500)  Hemodynamic parameters for last 24 hours:    Intake/Output from previous day: 10/02 0701 - 10/03 0700 In: 1040 [P.O.:780; I.V.:260] Out: 1800 [Urine:1500; Chest Tube:300] Intake/Output this shift: Total I/O In: 720 [P.O.:720] Out: 70 [Chest Tube:70]  General appearance: alert and cooperative Heart: regular rate and rhythm, S1, S2 normal, no murmur, click, rub or gallop Lungs: clear to auscultation bilaterally Chest tube output remains thick, milky Lab Results:  [facility] 08/23/12 0420 08/22/12 0450  WBC 9.2 10.5  HGB 13.9 14.2  HCT 39.3 41.0  PLT 299 301   BMET:  Basename 08/24/12 0400 08/23/12 0420  NA 128* 131*  K 4.1 3.8  CL 92* 94*  CO2 27 28  GLUCOSE 93 99  BUN 13 11  CREATININE 0.71 0.70  CALCIUM 9.1 9.2    PT/INR: No results found for this basename: LABPROT,INR in the last 72 hours ABG    Component Value Date/Time   PHART 7.498* 08/17/2012 0447   HCO3 28.1* 08/17/2012 0447   TCO2 29 08/17/2012 0447   O2SAT 99.0 08/17/2012 0447   CBG (last 3)   Basename 08/24/12 1209 08/24/12 0747 08/23/12 2134  GLUCAP 121* 110* 110*   *RADIOLOGY REPORT*   Clinical Data:  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  Evaluate for recurrence.   CT CHEST WITH CONTRAST 08/24/2012   Technique:  Multidetector CT imaging of the chest was  performed following the standard protocol during bolus administration of intravenous contrast.   Contrast: 80mL OMNIPAQUE IOHEXOL 300 MG/ML  SOLN   Comparison: Most recent prior chest CT 08/15/2012   Findings:   Mediastinum:  Nonspecific sub centimeter thyroid nodules.  Coarse calcification in the posterior left thyroid lobe is unchanged compared to most recent prior exam.  Avidly enhancing high left paratracheal lymph node (just posterior to the left clavicular head on image 10 of series 2) measures 12 x 19 mm and in diameter and demonstrates central hypoattenuation consistent with necrosis. This structure appears slightly enlarged compared to 9 mm on 08/15/2012.  No additional suspicious mediastinal adenopathy. Focal bronchial wall thickening versus prominent lymph node tissue tracking along the right mainstem bronchus into the proximal right upper, middle and lower lobe bronchi.   Heart/Vascular: Conventional three-vessel arch.  Scattered atherosclerotic vascular calcification without aneurysmal dilatation.  Atherosclerotic calcifications noted the left anterior descending, circumflex and right coronary arteries.  Heart is within normal limits for size.  No pericardial effusion.   Lungs/Pleura: Interval resolution of bilateral pleural effusions. Right-sided thoracostomy tube is in place and is directed posteriorly and apically. Small right-sided pneumothorax. Pleural air collection medially, apically and sub pulmonically.  Changes of radiation fibrosis noted in the medial bilateral upper and lower lobes. No new pulmonary nodule.   Upper Abdomen: Left renal cysts appear similar to prior.  No focal hepatic lesion identified.  Unremarkable visualized portions of the adrenal  glands.  Essentially unremarkable upper abdomen.   Bones: No acute fracture or aggressive appearing lytic or blastic osseous lesion.  Mild multilevel degenerative disc disease.   IMPRESSION:   1.   Avidly enhancing, centrally necrotic high left paratracheal lymph node just posterior to the left clavicular head is concerning for nodal recurrence.  No additional mediastinal, or hilar adenopathy identified.  This node is potentially amendable to ultrasound-guided fine needle aspiration, although the location deep in the thoracic inlet and posterior to the clavicle will be limiting.  Additionally, core biopsy may not be possible given the surrounding vascular structures.   2.  Prominent bronchial wall thickening versus lymphoid tissue along the right mainstem bronchus and proximal right upper, middle and lower lobe bronchi is not avidly enhancing, and is not appear significantly different compared to 08/15/2012.  This likely represents residual treated disease/post radiation changes.   3. Interval resolution of bilateral pleural effusions. 4.  Right thoracostomy tube in place.  There is a persistent small right-sided pneumothorax with pleural air apically, medially and subpulmonically.   4.  Multiple small thyroid nodules are nonspecific by CT.  One nodule in the posterior left thyroid lobe is associated with some coarse calcification which is unchanged compared to 08/15/2012 but new compared to 12/31/2008.  If clinically warranted, ultrasound could further evaluate.   5.  Mild radiation fibrosis in the medial aspects of the bilateral upper and lower lobes.   6.  Atherosclerotic vascular disease including coronary artery disease.     Original Report Authenticated By: Vilma Prader  Assessment/Plan:  Right Chylothorax. The output yesterday was 300 cc and only 70cc so far today on a diet so there is a good chance that NPO on TNA may resolve this. His chest CT was reviewed and there are no clear signs of recurrent cancer. There is an enlarged enhancing lymph node with central necrosis behind the left clavicular head that could be recurrence but it is only one lymph node, is still  relatively small and in a difficult area to biopsy with innominate vessels around it. There is thickening of the wall of the right mainstem bronchus into the lobar bronchi which is nonspecific and could be due to radiation. I think a PET scan would be helpful but does not have to be done now and can be done as an outpt. 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  LOS: 11 days    BARTLE,BRYAN K 08/24/2012

## 2012-08-24 NOTE — Progress Notes (Signed)
CARDIAC REHAB PHASE I   PRE:  Rate/Rhythm: 70 SR    BP: sitting 95/57    SaO2:   MODE:  Ambulation: 700 ft   POST:  Rate/Rhythm: 87 SR    BP: sitting 135/66     SaO2:   Tolerated well. Steady. C/o slight dizziness entire walk. BP stable on return to room. Return to recliner. [id number]  Harriet Masson CES, ACSM

## 2012-08-24 NOTE — Progress Notes (Signed)
[facility]  Subjective: No complaints  Objective: Vital signs in last 24 hours: Temp:  [97.6 F (36.4 C)-98.7 F (37.1 C)] 97.7 F (36.5 C) (10/03 0743) Pulse Rate:  [61-73] 73  (10/03 0743) Resp:  [17-18] 18  (10/02 2000) BP: (92-104)/(53-62) 99/55 mmHg (10/03 0743) SpO2:  [96 %-98 %] 96 % (10/03 0743) Weight:  [98.6 kg (217 lb 6 oz)] 98.6 kg (217 lb 6 oz) (10/03 0500) Last BM Date: 08/22/12  Intake/Output from previous day: 10/02 0701 - 10/03 0700 In: 1020 [P.O.:780; I.V.:240] Out: 300 [Chest Tube:300] Intake/Output this shift:    Medications Current Facility-Administered Medications  Medication Dose Route Frequency Provider Last Rate Last Dose  . 0.9 %  sodium chloride infusion   Intravenous Continuous Kalman Shan, MD 20 mL/hr at 08/24/12 0600    . acetaminophen (TYLENOL) tablet 650 mg  650 mg Oral Q4H PRN Lennette Bihari, MD   650 mg at 08/21/12 0752  . ALPRAZolam Prudy Feeler) tablet 0.25 mg  0.25 mg Oral TID PRN Abelino Derrick, PA      . amiodarone (PACERONE) tablet 400 mg  400 mg Oral Daily Mihai Croitoru, MD   400 mg at 08/23/12 0943  . aspirin EC tablet 81 mg  81 mg Oral Daily Lennette Bihari, MD   81 mg at 08/23/12 0943  . diphenhydrAMINE (BENADRYL) capsule 25 mg  25 mg Oral QHS PRN Storm Frisk, MD   25 mg at 08/24/12 0042  . insulin aspart (novoLOG) injection 0-15 Units  0-15 Units Subcutaneous TID WC Lonia Farber, MD   2 Units at 08/23/12 1734  . insulin aspart (novoLOG) injection 0-5 Units  0-5 Units Subcutaneous QHS Konstantin Zubelevitskiy, MD      . levalbuterol (XOPENEX) nebulizer solution 0.63 mg  0.63 mg Nebulization Q6H PRN Abelino Derrick, PA   0.63 mg at 08/15/12 1008  . lisinopril (PRINIVIL,ZESTRIL) tablet 5 mg  5 mg Oral Daily Eda Paschal [place], [place]   5 mg at 08/23/12 0943  . magnesium oxide (MAG-OX) tablet 400 mg  400 mg Per NG tube BID Thurmon Fair, MD   400 mg at 08/23/12 2139  . metoprolol tartrate (LOPRESSOR)  tablet 12.5 mg  12.5 mg Oral TID Lennette Bihari, MD   12.5 mg at 08/23/12 2138  . multivitamin with minerals tablet 1 tablet  1 tablet Oral Daily Kalman Shan, MD   1 tablet at 08/23/12 0943  . ondansetron (ZOFRAN) injection 4 mg  4 mg Intravenous Q4H PRN Chrystie Nose, MD   4 mg at 08/15/12 0207  . pantoprazole (PROTONIX) EC tablet 40 mg  40 mg Oral Q0600 Eda Paschal [place], PA   40 mg at 08/24/12 0531  . potassium chloride (K-DUR,KLOR-CON) CR tablet 30 mEq  30 mEq Oral BID Marinus Maw, MD   30 mEq at 08/23/12 2138  . Ticagrelor (BRILINTA) tablet 90 mg  90 mg Oral BID Lennette Bihari, MD   90 mg at 08/23/12 2139  . traMADol (ULTRAM) tablet 50 mg  50 mg Oral Q6H PRN Abelino Derrick, PA      . zolpidem (AMBIEN) tablet 5 mg  5 mg Oral QHS PRN Abelino Derrick, PA        PE: General appearance: alert, cooperative and no distress Lungs: + rhonchi Heart: regular rate and rhythm, S1, S2 normal, no murmur, click, rub or gallop Extremities: No LEE Pulses: 2+ and symmetric Skin: Warm and dry Neurologic:  Grossly normal  Lab Results:   Basename 08/23/12 0420 08/22/12 0450  WBC 9.2 10.5  HGB 13.9 14.2  HCT 39.3 41.0  PLT 299 301   BMET  Basename 08/24/12 0400 08/23/12 0420 08/22/12 0450  NA 128* 131* 131*  K 4.1 3.8 3.9  CL 92* 94* 91*  CO2 27 28 28   GLUCOSE 93 99 103*  BUN 13 11 12   CREATININE 0.71 0.70 0.76  CALCIUM 9.1 9.2 9.2   PT/INR No results found for this basename: LABPROT:3,INR:3 in the last 72 hours Cholesterol  Basename 08/22/12 0840  CHOL 156   Lipid Panel     Component Value Date/Time   CHOL 156 08/22/2012 0840   TRIG 128 08/22/2012 0840   HDL 35* 08/16/2012 0410   CHOLHDL 3.7 08/16/2012 0410   VLDL 21 08/16/2012 0410   LDLCALC 74 08/16/2012 0410    Assessment/Plan  Active Problems:  Sustained ventricular tachycardia with associated syncope; Monomorphic  Hx of cancer of lung, 3 years ago treated with radiation and chemo   GERD (gastroesophageal reflux  disease)  Hyperglycemia, pt has been on meds for diabetes in the past  Cardiomyopathy, ischemic, by cardiac cath 30-35% 08/13/12   CAD (coronary artery disease) 3 vessel disease  S/P coronary artery stent placement, acutely to RCA for acute Post. 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  Pleural effusion, intubated, chest tube place 08/16/12  Hypokalemia  Plan:  Lifevest.  Holding ACE/ARB due to low BP.  Hyponatremia worsening.  Recommend IV fluids.  This would help BP also.  CT chest today.  Follow up in [facility] with his oncologist Oct 10   LOS: 11 days    [name] [name] 08/24/2012 8:35 AM    Patient seen and examined. Agree with assessment and plan. Appreciated comprehensive assessment of Dr. Laneta Simmers. Pt now back from CT; report reviewed. Centrally necrotic avidly enhancing high left  paratracheal lymph node ? Recurrence.  Stable cardiac hemodynamics. No further VT. ? PET scan per Dr. Laneta Simmers.   Lennette Bihari, MD, [facility] 08/24/2012 12:08 PM

## 2012-08-24 NOTE — Progress Notes (Signed)
PARENTERAL NUTRITION CONSULT NOTE - INITIAL  Pharmacy Consult for TPN Indication: chylothorax  No Known Allergies  Patient Measurements: Height: 6' (182.9 cm) Weight: 217 lb 6 oz (98.6 kg) (standing weight scale) IBW/kg (Calculated) : 77.6   Vital Signs: Temp: 97.8 F (36.6 C) (10/03 1200) Temp src: Oral (10/03 1200) BP: 99/57 mmHg (10/03 1200) Pulse Rate: 62  (10/03 1200) Intake/Output from previous day: 10/02 0701 - 10/03 0700 In: 1040 [P.O.:780; I.V.:260] Out: 1800 [Urine:1500; Chest Tube:300] Intake/Output from this shift: Total I/O In: 480 [P.O.:480] Out: 70 [Chest Tube:70]  Labs:  [facility] 08/23/12 0420 08/22/12 0450  WBC 9.2 10.5  HGB 13.9 14.2  HCT 39.3 41.0  PLT 299 301  APTT -- --  INR -- --     Basename 08/24/12 0400 08/23/12 0420 08/22/12 0840 08/22/12 0450  NA 128* 131* -- 131*  K 4.1 3.8 -- 3.9  CL 92* 94* -- 91*  CO2 27 28 -- 28  GLUCOSE 93 99 -- 103*  BUN 13 11 -- 12  CREATININE 0.71 0.70 -- 0.76  LABCREA -- -- -- --  CREAT24HRUR -- -- -- --  CALCIUM 9.1 9.2 -- 9.2  MG -- 2.3 -- 2.3  PHOS -- 4.0 -- 4.2  PROT -- -- 6.1 --  ALBUMIN -- -- -- --  AST -- -- -- --  ALT -- -- -- --  ALKPHOS -- -- -- --  BILITOT -- -- -- --  BILIDIR -- -- -- --  IBILI -- -- -- --  PREALBUMIN -- -- -- --  TRIG -- -- 128 --  CHOLHDL -- -- -- --  CHOL -- -- 156 --   Estimated Creatinine Clearance: 128.4 ml/min (by C-G formula based on Cr of 0.71).    Basename 08/24/12 1209 08/24/12 0747 08/23/12 2134  GLUCAP 121* 110* 110*    Medical History: Past Medical History  Diagnosis Date  . Cancer     lung  . STEMI (ST elevation myocardial infarction) 08/13/2012  . Sustained ventricular tachycardia with associated syncope 08/13/2012  . Hx of cancer of lung, 3 years ago treated with radiation and chemo and resolved 08/13/2012  . GERD (gastroesophageal reflux disease) 08/13/2012  . Hyperglycemia 08/13/2012  . Cardiomyopathy, ischemic, by cardiac cath 30-35%  08/13/12  08/13/2012  . CAD (coronary artery disease) 3 vessel disease 08/13/2012  . S/P coronary artery stent placement, acutely to RCA for acute Post. MI with BMS, 08/13/12 08/13/2012    Medications:  Scheduled:    . amiodarone  400 mg Oral Daily  . aspirin EC  81 mg Oral Daily  . insulin aspart  0-15 Units Subcutaneous TID WC  . insulin aspart  0-5 Units Subcutaneous QHS  . lisinopril  5 mg Oral Daily  . magnesium oxide  400 mg Per NG tube BID  . metoprolol tartrate  12.5 mg Oral TID  . multivitamin with minerals  1 tablet Oral Daily  . pantoprazole  40 mg Oral Q0600  . potassium chloride  30 mEq Oral BID  . Ticagrelor  90 mg Oral BID   Infusions:    . sodium chloride 20 mL/hr at 08/24/12 0600   CBG (last 3)   Basename 08/24/12 1209 08/24/12 0747 08/23/12 2134  GLUCAP 121* 110* 110*    Insulin Requirements in the past 24 hours:  4 units on moderate SSI TIDac  Current Nutrition:  Changing to NPO  Assessment: 56yo M admitted 9/22 with sycompe and sustained VT with STEMI, emergent cath with  PCI, stent, pacer.  Developed respiratory distress with VDRF, found to have PNA and moderate R pleural effusion on CT chest.  Now found to have 300 cc chylous pleural fluid out of chest tube in 12h.  Diet changed to NPO and orders for TNA per pharmacy.  GI: npo; currently PO mvi Endo: cbg good on SSI  Lytes: WNL except Na chronically low; magox 400 mg bid and kcl 30 meq bid standing Renal: Stabel with Scr at 0.71 uop good at 0.7 ml/kg/hr Hepatobil: lfts wnl Pulm: RA Neuro: WNL Cards: on ticagrelor s/p pci, stenting; lisinopril, metoprolol ID: s/p ancef for MSSA; wbc 9.2, afeb off abx Best Practices: TPN access: central line TPN Day #: 0  Nutritional Goals: ~2200 kCal, 100-120 grams of protein per day  Plan:  - Clinimix E 5/15 at 40 ml/hr with plans to advance to goal of 100 ml/hr will provide 2184 kcal and 120 g protein per day once lytes, cbg stable - Provide lipids at 10 ml/hr  MWF only 2/2 national shortage - F/u am TNA labs - F/u RD consult for goals - Change ssi to q4h with NPO status - Continue po MVI daily for now 2/2 national shortage of IV MVI and TE  Cina Klumpp L. Illene Bolus, PharmD, BCPS Clinical Pharmacist Pager: [phone] Pharmacy: [phone] 08/24/2012 1:26 PM

## 2012-08-24 NOTE — Progress Notes (Signed)
Name: [name] [name] MRN: [id number] DOB: 04/05/1956    LOS: 11  Referring Provider:  [name] (cards) Reason for Referral:  Recurrent VT, hx small cell lung ca  PULMONARY / CRITICAL CARE MEDICINE  HPI:  56 yo male smoker admitted 08/13/2012 with syncope and sustained VT with STEMI.  He had emergent cath with PCI/stent and pacer.  Noted to have abnormal CXR with hx of SCLC (Tx 3 yrs ago) and PCCM consulted 9/23.  Developed respiratory distress with VDRF and found to have PNA and moderate Rt pleural effusion on CT chest.  Pleural fluid was consistent with empyema and chest tube placed. PMHx systolic CHF (EF 40%)  Lines/tubes: ETT 9/24>> 08/19/12 Rt IJ CVL 9/24>> Rt chest tube 9/24>>  Cultures: MRSA PCR 9/24 - NEGATIVE Sputum 9/24>>Few S aureus (MSSA) Blood 9/24>>Coag neg Staph Rt pleural fluid 9/24>> Urine 9/24>>negative     Antiotics: Vancomycin 9/24>>08/20/12 Zosyn 9/24>>08/20/12 Ancef 08/20/12 (MSSA sputum) >> (4 more days)  Tests/events: 9//23 Echo>>EF 25 to 30% 9/24 VDRF, pulmonary infiltrates, pleural effusion, chest tube 9/24 Rt pleural fluid>>protein 2.9, LDH 124, glucose 153, WBC 1452 (3N, 89L, 51M, 0E).  Cytology negative for malignancy 9/24 CT chest>>patchy perihilar ASD b/l, moderate right pleural effusion 9/27 - pacer sheath removed 9/28 - extubated. Off pressors 9/29 - chylous pleural  noticed  Subjective: Extubated doing well. Wants to eat. Deconditioned  300cc chylous pleural fluid out of chest tube in 12h  Continues to have relatively frequent, very premature PVCs with fixed coupling interval and R-on-T appearance, but overall burden appears to be lower and no runs seen today.   Vital Signs: Temp:  [97.7 F (36.5 C)-98.7 F (37.1 C)] 97.8 F (36.6 C) (10/03 1200) Pulse Rate:  [61-73] 62  (10/03 1200) Resp:  [17-18] 18  (10/03 1200) BP: (92-104)/(53-62) 99/57 mmHg (10/03 1200) SpO2:  [96 %-98 %] 97 % (10/03 1200) Weight:  [98.6 kg (217 lb 6 oz)] 98.6  kg (217 lb 6 oz) (10/03 0500)  I/O last 3 completed shifts: In: 1040 [P.O.:780; I.V.:260] Out: 2940 [Urine:2550; Chest Tube:390]    Physical Examination: General - no distress HEENT - [place]/AT, PERRL, EOM-I, -LAN and -thyromegally. Cardiac - s2 s2 regular.  Chest - no wheeze, Rt chest tube in place Abd - soft, non tender Ext - no edema Neuro - Alert and oriented x3, walking the halls. 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  Evaluate for recurrence.  CT CHEST WITH CONTRAST  Technique:  Multidetector CT imaging of the chest was performed following the standard protocol during bolus administration of intravenous contrast.  Contrast: 80mL OMNIPAQUE IOHEXOL 300 MG/ML  SOLN  Comparison: Most recent prior chest CT 08/15/2012  Findings:  Mediastinum:  Nonspecific sub centimeter thyroid nodules.  Coarse calcification in the posterior left thyroid lobe is unchanged compared to most recent prior exam.  Avidly enhancing high left paratracheal lymph node (just posterior to the left clavicular head on image 10 of series 2) measures 12 x 19 mm and in diameter and demonstrates central hypoattenuation consistent with necrosis. This structure appears slightly enlarged compared to 9 mm on 08/15/2012.  No additional suspicious mediastinal adenopathy. Focal bronchial wall thickening versus prominent lymph node tissue tracking along the right mainstem bronchus into the proximal right upper, middle and lower lobe bronchi.  Heart/Vascular: Conventional three-vessel arch.  Scattered atherosclerotic vascular calcification without aneurysmal dilatation.  Atherosclerotic calcifications  noted the left anterior descending, circumflex and right coronary arteries.  Heart is within normal limits for size.  No pericardial effusion.  Lungs/Pleura: Interval resolution of bilateral pleural effusions. Right-sided thoracostomy tube  is in place and is directed posteriorly and apically. Small right-sided pneumothorax. Pleural air collection medially, apically and sub pulmonically.  Changes of radiation fibrosis noted in the medial bilateral upper and lower lobes. No new pulmonary nodule.  Upper Abdomen: Left renal cysts appear similar to prior.  No focal hepatic lesion identified.  Unremarkable visualized portions of the adrenal glands.  Essentially unremarkable upper abdomen.  Bones: No acute fracture or aggressive appearing lytic or blastic osseous lesion.  Mild multilevel degenerative disc disease.  IMPRESSION:  1.  Avidly enhancing, centrally necrotic high left paratracheal lymph node just posterior to the left clavicular head is concerning for nodal recurrence.  No additional mediastinal, or hilar adenopathy identified.  This node is potentially amendable to ultrasound-guided fine needle aspiration, although the location deep in the thoracic inlet and posterior to the clavicle will be limiting.  Additionally, core biopsy may not be possible given the surrounding vascular structures.  2.  Prominent bronchial wall thickening versus lymphoid tissue along the right mainstem bronchus and proximal right upper, middle and lower lobe bronchi is not avidly enhancing, and is not appear significantly different compared to 08/15/2012.  This likely represents residual treated disease/post radiation changes.  3. Interval resolution of bilateral pleural effusions. 4.  Right thoracostomy tube in place.  There is a persistent small right-sided pneumothorax with pleural air apically, medially and subpulmonically.  4.  Multiple small thyroid nodules are nonspecific by CT.  One nodule in the posterior left thyroid lobe is associated with some coarse calcification which is unchanged compared to 08/15/2012 but new compared to 12/31/2008.  If clinically warranted, ultrasound could further evaluate.  5.  Mild radiation fibrosis in the medial aspects of the  bilateral upper and lower lobes.  6.  Atherosclerotic vascular disease including coronary artery disease.   Original Report Authenticated By: Vilma Prader     BMET Lab Results  Component Value Date   CREATININE 0.71 08/24/2012   BUN 13 08/24/2012   NA 128* 08/24/2012   K 4.1 08/24/2012   CL 92* 08/24/2012   CO2 27 08/24/2012    CBC Lab Results  Component Value Date   WBC 9.2 08/23/2012   HGB 13.9 08/23/2012   HCT 39.3 08/23/2012   MCV 88.3 08/23/2012   PLT 299 08/23/2012   Lab Results  Component Value Date   ALT 8 08/17/2012   AST 12 08/17/2012   ALKPHOS 42 08/17/2012   BILITOT 0.6 08/17/2012   BNP    Component Value Date/Time   PROBNP 2216.0* 08/21/2012 0930   ABG    Component Value Date/Time   PHART 7.498* 08/17/2012 0447   PCO2ART 36.4 08/17/2012 0447   PO2ART 125.0* 08/17/2012 0447   HCO3 28.1* 08/17/2012 0447   TCO2 29 08/17/2012 0447   O2SAT 99.0 08/17/2012 0447   CBG (last 3)   Basename 08/24/12 0747 08/23/12 2134 08/23/12 1651  GLUCAP 110* 110* 131*     ASSESSMENT AND PLAN  A: Acute respiratory failure with pulmonary infiltrates, and right pleural effusion.  Rt pleural effusion noted to appear purulent, but numbers show transudate with predominance of lymphocytes and relatively high trigs (cut off for chylothorax is 110 and patient's is 97 but without eating that number can be falsely lowered), and cytology negative for malignancy.  This is clearly  a chylothorax now that trig at in the 400s in pleural fluid.  P: Incentive spirometry, pulmonary toilet. IR here does not perform an lymphangiogram with coiling. NPO and TPN for chylo.  A: Shock ?cardiogenic vs septic.  - Resolved, now borderline BP due to medications.  P: Per cards.  A: Recurrent VT with STEMI.  Systolic CHF.  Improved.  P Amio to PO. Per cardiology and EP cardiology.  A: RENAL and lytes  Lab 08/24/12 0400 08/23/12 0420 08/22/12 0450 08/21/12 0430 08/20/12 0400 08/18/12 0440  NA 128* 131* 131* 131*  131* --  K 4.1 3.8 -- -- -- --  CL 92* 94* 91* 93* 94* --  CO2 27 28 28 29 31  --  GLUCOSE 93 99 103* 93 107* --  BUN 13 11 12 10 12  --  CREATININE 0.71 0.70 0.76 0.72 0.66 --  CALCIUM 9.1 9.2 9.2 8.9 8.4 --  MG -- 2.3 2.3 2.4 2.4 1.9  PHOS -- 4.0 4.2 4.0 3.7 --    Intake/Output Summary (Last 24 hours) at 08/24/12 1217 Last data filed at 08/24/12 1200  Gross per 24 hour  Intake   1280 ml  Output   1870 ml  Net   -590 ml    on 9/29: Mild hyponatremia P: Goal K > 4; so replete Goal mag > 2; recheck f/u BMET Continue to hold additional lasix.  A: Hyperglycemia. P: Change to Ac and HS on 9/29  A: INFECTIOUS DISEASES No results found for this basename: PROCALCITON:5 in the last 168 hours MSSA in sputum P Ancef course of MSSA complete, afebrile and stable.  A: Nutrition. P: Will start TPN and make NPO for chylothorax.  Alyson Reedy, M.D. [facility] Pulmonary/Critical Care Medicine. Pager: [phone]. After hours pager: [phone].

## 2012-08-25 ENCOUNTER — Encounter (HOSPITAL_COMMUNITY): Payer: Self-pay | Admitting: Cardiology

## 2012-08-25 DIAGNOSIS — I898 Other specified noninfective disorders of lymphatic vessels and lymph nodes: Secondary | ICD-10-CM

## 2012-08-25 DIAGNOSIS — J940 Chylous effusion: Secondary | ICD-10-CM

## 2012-08-25 HISTORY — DX: Chylous effusion: J94.0

## 2012-08-25 LAB — GLUCOSE, CAPILLARY
Glucose-Capillary: 108 mg/dL — ABNORMAL HIGH (ref 70–99)
Glucose-Capillary: 127 mg/dL — ABNORMAL HIGH (ref 70–99)

## 2012-08-25 LAB — PREALBUMIN: Prealbumin: 22.4 mg/dL (ref 17.0–34.0)

## 2012-08-25 LAB — CBC
Hemoglobin: 14.5 g/dL (ref 13.0–17.0)
MCH: 31.1 pg (ref 26.0–34.0)
Platelets: 348 10*3/uL (ref 150–400)
RBC: 4.66 MIL/uL (ref 4.22–5.81)
WBC: 9.5 10*3/uL (ref 4.0–10.5)

## 2012-08-25 LAB — PHOSPHORUS: Phosphorus: 3.5 mg/dL (ref 2.3–4.6)

## 2012-08-25 LAB — BASIC METABOLIC PANEL
CO2: 26 mEq/L (ref 19–32)
Chloride: 94 mEq/L — ABNORMAL LOW (ref 96–112)
Glucose, Bld: 111 mg/dL — ABNORMAL HIGH (ref 70–99)
Potassium: 4 mEq/L (ref 3.5–5.1)
Sodium: 130 mEq/L — ABNORMAL LOW (ref 135–145)

## 2012-08-25 LAB — MAGNESIUM: Magnesium: 2.3 mg/dL (ref 1.5–2.5)

## 2012-08-25 MED ORDER — FAT EMULSION 20 % IV EMUL
250.0000 mL | INTRAVENOUS | Status: AC
  Administered 2012-08-25: 250 mL via INTRAVENOUS
  Filled 2012-08-25: qty 250

## 2012-08-25 MED ORDER — CLINIMIX E/DEXTROSE (5/20) 5 % IV SOLN
INTRAVENOUS | Status: AC
  Administered 2012-08-25: 19:00:00 via INTRAVENOUS
  Filled 2012-08-25: qty 2000

## 2012-08-25 MED ORDER — LISINOPRIL 2.5 MG PO TABS
2.5000 mg | ORAL_TABLET | Freq: Every day | ORAL | Status: DC
  Administered 2012-08-26 – 2012-09-01 (×7): 2.5 mg via ORAL
  Filled 2012-08-25 (×8): qty 1

## 2012-08-25 MED ORDER — METOPROLOL TARTRATE 12.5 MG HALF TABLET
12.5000 mg | ORAL_TABLET | Freq: Three times a day (TID) | ORAL | Status: DC
  Administered 2012-08-25 – 2012-09-01 (×15): 12.5 mg via ORAL
  Filled 2012-08-25 (×26): qty 1

## 2012-08-25 NOTE — Progress Notes (Signed)
08/25/2012 5:00 PM Dr. Rennis Golden on floor, asked MD regarding removal of central line once PICC line placed this afternoon. Verbal orders received to leave both central line and PICC line in place until further evaluation by MD in am 10/5. Orders enacted. Pt. Updated on plan. Will continue to monitor patient.  Lindzie Boxx, Blanchard Kelch

## 2012-08-25 NOTE — Progress Notes (Signed)
Peripherally Inserted Central Catheter/Midline Placement  The IV Nurse has discussed with the patient and/or persons authorized to consent for the patient, the purpose of this procedure and the potential benefits and risks involved with this procedure.  The benefits include less needle sticks, lab draws from the catheter and patient may be discharged home with the catheter.  Risks include, but not limited to, infection, bleeding, blood clot (thrombus formation), and puncture of an artery; nerve damage and irregular heat beat.  Alternatives to this procedure were also discussed.  PICC/Midline Placement Documentation        Lisabeth Devoid 08/25/2012, 5:14 PM

## 2012-08-25 NOTE — Progress Notes (Signed)
CARDIAC REHAB PHASE I   PRE:  Rate/Rhythm: 61SR  BP:  Supine:   Sitting: 88/60  Standing:    SaO2: 98%RA  MODE:  Ambulation: 630 ft   POST:  Rate/Rhythem: 73SR  BP:  Supine:   Sitting: 110/70  Standing:    SaO2: 99%RA 1345-1412 Pt feeling more rested now. Walked 630 ft on RA with rolling walker and minimal asst. BP low prior to walk. Pt c/o slight dizziness during walk. BP better after walk. To recliner with call bell.  Duanne Limerick

## 2012-08-25 NOTE — Progress Notes (Signed)
Pt. Seen and examined. Agree with the NP/PA-C note as written.  Agree with holding lisinopril for soft bp.  Appreciate CT surgery and PCCM recommendations for management of chylothorax. No plans for PET scan during this hospitalization. Other cardiac issues appear stable.   Chrystie Nose, MD, [facility] Attending Cardiologist [facility]

## 2012-08-25 NOTE — Progress Notes (Signed)
Subjective: Did not rest well last pm, but otherwise without significant complaints  Objective: Vital signs in last 24 hours: Temp:  [97.8 F (36.6 C)-98.3 F (36.8 C)] 98 F (36.7 C) (10/04 0444) Pulse Rate:  [62-67] 62  (10/04 0700) Resp:  [18-20] 20  (10/04 0444) BP: (87-114)/(54-68) 99/61 mmHg (10/04 0700) SpO2:  [96 %-99 %] 96 % (10/04 0444) Weight change:  Last BM Date: 08/24/12 Intake/Output from previous day:  -230 10/03 0701 - 10/04 0700 In: 720 [P.O.:720] Out: 770 [Urine:700; Chest Tube:70] Intake/Output this shift: Total I/O In: -  Out: 400 [Urine:400]  PE: General:alert and oriented, pleasant affect Neck:IJ on rt. Heart:S1S2 RRR   Lungs:clear no rales or wheezes, chest tube with yellow-white drainage. ZOX:WRUE non tender + BS Ext:no edema    Lab Results:  Basename 08/25/12 0535 08/23/12 0420  WBC 9.5 9.2  HGB 14.5 13.9  HCT 41.2 39.3  PLT 348 299   BMET  Basename 08/25/12 0535 08/24/12 0400  NA 130* 128*  K 4.0 4.1  CL 94* 92*  CO2 26 27  GLUCOSE 111* 93  BUN 13 13  CREATININE 0.70 0.71  CALCIUM 8.8 9.1   No results found for this basename: TROPONINI:2,CK,MB:2 in the last 72 hours  Lab Results  Component Value Date   CHOL 156 08/22/2012   HDL 35* 08/16/2012   LDLCALC 74 08/16/2012   TRIG 128 08/22/2012   CHOLHDL 3.7 08/16/2012   Lab Results  Component Value Date   HGBA1C 5.9* 08/13/2012     Lab Results  Component Value Date   TSH 5.724* 08/13/2012       EKG: Orders placed during the hospital encounter of 08/13/12  . EKG 12-LEAD  . EKG 12-LEAD  . EKG 12-LEAD  . EKG 12-LEAD  . EKG 12-LEAD  . EKG 12-LEAD  . EKG 12-LEAD  . EKG 12-LEAD  . EKG 12-LEAD  . EKG 12-LEAD  . EKG  . EKG 12-LEAD  . EKG 12-LEAD  . EKG 12-LEAD  . 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  Evaluate for recurrence.  CT  CHEST WITH CONTRAST  Technique:  Multidetector CT imaging of the chest was performed following the standard protocol during bolus administration of intravenous contrast.  Contrast: 80mL OMNIPAQUE IOHEXOL 300 MG/ML  SOLN  Comparison: Most recent prior chest CT 08/15/2012  Findings:  Mediastinum:  Nonspecific sub centimeter thyroid nodules.  Coarse calcification in the posterior left thyroid lobe is unchanged compared to most recent prior exam.  Avidly enhancing high left paratracheal lymph node (just posterior to the left clavicular head on image 10 of series 2) measures 12 x 19 mm and in diameter and demonstrates central hypoattenuation consistent with necrosis. This structure appears slightly enlarged compared to 9 mm on 08/15/2012.  No additional suspicious mediastinal adenopathy. Focal bronchial wall thickening versus prominent lymph node tissue tracking along the right mainstem bronchus into the proximal right upper, middle and lower lobe bronchi.  Heart/Vascular: Conventional three-vessel arch.  Scattered atherosclerotic vascular calcification without aneurysmal dilatation.  Atherosclerotic calcifications noted the left anterior descending, circumflex and right coronary arteries.  Heart is within normal limits for size.  No pericardial effusion.  Lungs/Pleura: Interval resolution of bilateral pleural effusions. Right-sided thoracostomy tube is in place and is directed posteriorly and apically. Small right-sided pneumothorax. Pleural air collection medially, apically and sub  pulmonically.  Changes of radiation fibrosis noted in the medial bilateral upper and lower lobes. No new pulmonary nodule.  Upper Abdomen: Left renal cysts appear similar to prior.  No focal hepatic lesion identified.  Unremarkable visualized portions of the adrenal glands.  Essentially unremarkable upper abdomen.  Bones: No acute fracture or aggressive appearing lytic or blastic osseous lesion.  Mild multilevel degenerative disc disease.   IMPRESSION:  1.  Avidly enhancing, centrally necrotic high left paratracheal lymph node just posterior to the left clavicular head is concerning for nodal recurrence.  No additional mediastinal, or hilar adenopathy identified.  This node is potentially amendable to ultrasound-guided fine needle aspiration, although the location deep in the thoracic inlet and posterior to the clavicle will be limiting.  Additionally, core biopsy may not be possible given the surrounding vascular structures.  2.  Prominent bronchial wall thickening versus lymphoid tissue along the right mainstem bronchus and proximal right upper, middle and lower lobe bronchi is not avidly enhancing, and is not appear significantly different compared to 08/15/2012.  This likely represents residual treated disease/post radiation changes.  3. Interval resolution of bilateral pleural effusions. 4.  Right thoracostomy tube in place.  There is a persistent small right-sided pneumothorax with pleural air apically, medially and subpulmonically.  4.  Multiple small thyroid nodules are nonspecific by CT.  One nodule in the posterior left thyroid lobe is associated with some coarse calcification which is unchanged compared to 08/15/2012 but new compared to 12/31/2008.  If clinically warranted, ultrasound could further evaluate.  5.  Mild radiation fibrosis in the medial aspects of the bilateral upper and lower lobes.  6.  Atherosclerotic vascular disease including coronary artery disease.   Original Report Authenticated By: Vilma Prader     Medications: I have reviewed the patient's current medications.    Marland Kitchen amiodarone  400 mg Oral Daily  . aspirin EC  81 mg Oral Daily  . insulin aspart  0-15 Units Subcutaneous Q4H  . lisinopril  5 mg Oral Daily  . magnesium oxide  400 mg Per NG tube BID  . metoprolol tartrate  12.5 mg Oral TID  . multivitamin with minerals  1 tablet Oral Daily  . pantoprazole  40 mg Oral Q0600  . potassium chloride  30 mEq Oral BID  .  sodium chloride  10-40 mL Intracatheter Q12H  . Ticagrelor  90 mg Oral BID  . DISCONTD: insulin aspart  0-15 Units Subcutaneous TID WC  . DISCONTD: insulin aspart  0-5 Units Subcutaneous QHS   Assessment/Plan: Principal Problem:  *Sustained ventricular tachycardia with associated syncope; Monomorphic Active Problems:  Hx of cancer of lung, 3 years ago treated with radiation and chemo   GERD (gastroesophageal reflux disease)  Hyperglycemia, pt has been on meds for diabetes in the past  Cardiomyopathy, ischemic, by cardiac cath 30-35% 08/13/12   CAD (coronary artery disease) 3 vessel disease  S/P coronary artery stent placement, acutely to RCA for acute Post. 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  Amio 400 mg/day.   Will send in papers if orders not yet sent.  Rt. Chylothorax, trig at 400s in the pleural fluid, NPO and TPN for chylo.  BP 87 to 108 systolic  On lisinopril 5 mg daily, lopressor 12.5 TID,  Will hold lisinopril today due to hypotension and decrease to 2.5 daily tomorrow.  Is somewhat dizzy with ambulation. Cardiac rehab is ambulating the pt.  VTE proph. With SCDs  LOS: 12 days   [name],[name] 08/25/2012, 10:00 AM

## 2012-08-25 NOTE — Progress Notes (Addendum)
Nutrition Follow-up/consult New TPN  Intervention:   1. Recommend increase TPN to meet 2100-2300 kcal, 100-120 gm protein 2. Recommend monitor electrolytes and vitamin D.  3. RD will continue to follow    Assessment:   Pt now with chylous pleural fluid from chest tube. Made NPO and TPN initiated.  Pt with increased nutrition needs, kcal and protein, r/t losses from pleural fluid drainage. Pt is at increased risk for malnutrition and weight loss.   Alternate therapy includes very low fat (2-5% of kcal from LCT) enteral nutrition or PO intake with supplemental MCT oil to meet essential fatty acid needs.   Recommend monitor serum calcium, sodium and potassium. If pt to remain on TPN or low fat diet for extended period of time, monitor for vitamin D deficiency.    Diet Order:  NPO TPN: Clinimix E 5/20 at 83 ml/hr. Lipids at 10 ml/hr on MWF only 2/2 Sport and exercise psychologist. At goal, TPN will provide 1909 kcal and 99 gm protein based on weekly average.   Meds: Scheduled Meds:    . amiodarone  400 mg Oral Daily  . aspirin EC  81 mg Oral Daily  . insulin aspart  0-15 Units Subcutaneous Q4H  . lisinopril  5 mg Oral Daily  . magnesium oxide  400 mg Per NG tube BID  . metoprolol tartrate  12.5 mg Oral TID  . multivitamin with minerals  1 tablet Oral Daily  . pantoprazole  40 mg Oral Q0600  . potassium chloride  30 mEq Oral BID  . sodium chloride  10-40 mL Intracatheter Q12H  . Ticagrelor  90 mg Oral BID  . DISCONTD: insulin aspart  0-15 Units Subcutaneous TID WC  . DISCONTD: insulin aspart  0-5 Units Subcutaneous QHS   Continuous Infusions:    . sodium chloride 20 mL/hr at 08/24/12 0600  . fat emulsion    . TPN (CLINIMIX) +/- additives 40 mL/hr at 08/24/12 1740  . TPN (CLINIMIX) +/- additives     PRN Meds:.acetaminophen, ALPRAZolam, diphenhydrAMINE, levalbuterol, ondansetron (ZOFRAN) IV, sodium chloride, traMADol, zolpidem  Labs:  CMP     Component Value Date/Time   NA 130* 08/25/2012  0535   K 4.0 08/25/2012 0535   CL 94* 08/25/2012 0535   CO2 26 08/25/2012 0535   GLUCOSE 111* 08/25/2012 0535   BUN 13 08/25/2012 0535   CREATININE 0.70 08/25/2012 0535   CALCIUM 8.8 08/25/2012 0535   PROT 6.1 08/22/2012 0840   ALBUMIN 2.3* 08/17/2012 0445   AST 12 08/17/2012 0445   ALT 8 08/17/2012 0445   ALKPHOS 42 08/17/2012 0445   BILITOT 0.6 08/17/2012 0445   GFRNONAA >90 08/25/2012 0535   GFRAA >90 08/25/2012 0535     Intake/Output Summary (Last 24 hours) at 08/25/12 1008 Last data filed at 08/25/12 0900  Gross per 24 hour  Intake    240 ml  Output   1130 ml  Net   -890 ml    Weight Status:  217 lbs Filed Weights   08/20/12 0500 08/23/12 0600 08/24/12 0500  Weight: 235 lb 7.2 oz (106.8 kg) 216 lb 11.4 oz (98.3 kg) 217 lb 6 oz (98.6 kg)    Re-estimated needs:  2100-2300 kcal, 100-120 gm protein  Nutrition Dx:  Altered GI function r/t chyle leak AEB triglycerides over 400 in pleural fluids   Monitor:  TPN, labs, weight, I/O's   Clarene Duke RD, LDN Pager [phone] After Hours pager [phone]

## 2012-08-25 NOTE — Progress Notes (Signed)
PARENTERAL NUTRITION CONSULT NOTE - FOLLOW UP  Pharmacy Consult for TPN Indication: Chylothorax  No Known Allergies  Patient Measurements: Height: 6' (182.9 cm) Weight: 217 lb 6 oz (98.6 kg) (standing weight scale) IBW/kg (Calculated) : 77.6  Adjusted Body Weight: 83.9 kg Usual Weight: 98.6 kg  Vital Signs: Temp: 98 F (36.7 C) (10/04 0444) Temp src: Oral (10/04 0444) BP: 99/61 mmHg (10/04 0700) Pulse Rate: 62  (10/04 0700) Intake/Output from previous day: 10/03 0701 - 10/04 0700 In: 720 [P.O.:720] Out: 770 [Urine:700; Chest Tube:70] Intake/Output from this shift:    Labs:  [facility] 08/25/12 0535 08/23/12 0420  WBC 9.5 9.2  HGB 14.5 13.9  HCT 41.2 39.3  PLT 348 299  APTT -- --  INR -- --     Basename 08/25/12 0535 08/24/12 0400 08/23/12 0420 08/22/12 0840  NA 130* 128* 131* --  K 4.0 4.1 3.8 --  CL 94* 92* 94* --  CO2 26 27 28  --  GLUCOSE 111* 93 99 --  BUN 13 13 11  --  CREATININE 0.70 0.71 0.70 --  LABCREA -- -- -- --  CREAT24HRUR -- -- -- --  CALCIUM 8.8 9.1 9.2 --  MG 2.3 -- 2.3 --  PHOS 3.5 -- 4.0 --  PROT -- -- -- 6.1  ALBUMIN -- -- -- --  AST -- -- -- --  ALT -- -- -- --  ALKPHOS -- -- -- --  BILITOT -- -- -- --  BILIDIR -- -- -- --  IBILI -- -- -- --  PREALBUMIN -- -- -- --  TRIG -- -- -- 128  CHOLHDL -- -- -- --  CHOL -- -- -- 156   Estimated Creatinine Clearance: 128.4 ml/min (by C-G formula based on Cr of 0.7).    Basename 08/25/12 0446 08/25/12 0356 08/25/12 0022  GLUCAP 130* 129* 108*    Insulin Requirements in the past 24 hours:  4 units on moderate SSI TIDac   Current Nutrition:  Changing to NPO  Clinimix E 5/15 at 11ml/hr= 682 kcal/49g Protein in 1st bag  Nutritional Goals: 1950-2150 kcal, 90-105 gm protein per RD note 08/21/12 Clinimix E 5/20 35ml/hr with lipids MWF (due to national backorder) averages: 1958 kcal/day and 99.4g protein/day.   Assessment:  56yo M admitted 9/22 with syncope and sustained VT with STEMI,  emergent cath/PCI, stent, temp. pacer. Developed respiratory distress with VDRF, found to have PNA and moderate R pleural effusion on CT chest. Now found to have 300 cc chylous pleural fluid out of chest tube in 12h. Diet changed to NPO and orders for TNA per pharmacy.   GI: NPO, PO MVI, PO PPI   Endo: CBG 108-130 since starting TPN last night on SSI.  Lytes:  Na chronically low; Mg 2.3 onmagox 400 mg bid and K=4 on KCL 30 meq bid standing   Renal: Stable with Scr at 0.7  Hepatobil: LFTs ok. Chol 156 and Trig 128  Pulm: RA on Xopenex,   Neuro: WNL   Cards: h/o systolic CHF with EF 30%. STEMI s/p PCI on amiodarone, ASA 81mg , lisinopril, Magox, metoprolol, K, Brilinta  ID: s/p ancef for MSSA in sputum 9/24; now Afebrile with WBC 9.5  Heme/Onc: H/o small celll lung cancer. His chest CT was reviewed and there are no clear signs of recurrent cancer. Needs outpt. PET scan.  Best Practices:  TPN access: central line triple lumen Po PPI DVT prophx: SCDs   Plan:  - Advance Clinimix E 5/20 at 83 ml/hr with  plans to advance to goal of 83 ml/hr - Provide lipids at 10 ml/hr MWF only 2/2 national shortage  - F/u prealbumin result, will order today. - F/u any new RD assessments - Continue po MVI daily for now 2/2 national shortage of IV MVI and TE   [name] [name] S. Merilynn Finland, PharmD, BCPS Clinical Staff Pharmacist Pager [phone]  [name] [name] 08/25/2012,8:03 AM

## 2012-08-25 NOTE — Progress Notes (Signed)
Name: [name] [name] MRN: [id number] DOB: Feb 21, 1956    LOS: 12  Referring Provider:  [name] (cards) Reason for Referral:  Recurrent VT, hx small cell lung ca  PULMONARY / CRITICAL CARE MEDICINE  HPI:  56 yo male, smoker, admitted 08/13/2012 with syncope and sustained VT with STEMI.  He had emergent cath with PCI/stent and pacer.  Noted to have abnormal CXR with hx of SCLC (Tx 3 yrs ago) and PCCM consulted 9/23.  Developed respiratory distress with VDRF and found to have PNA and moderate Rt pleural effusion on CT chest.  Pleural fluid was consistent with empyema and chest tube placed- fluid consistent with chylothorax.    PMHx systolic CHF (EF 02%)  Lines/tubes: ETT 9/24>> 08/19/12 Rt IJ CVL 9/24>> Rt chest tube 9/24>>  Cultures: MRSA PCR 9/24 - NEGATIVE Sputum 9/24>>Few S aureus (MSSA) Blood 9/24>>Coag neg Staph Rt pleural fluid 9/24>>neg Urine 9/24>>negative   Antiotics: Vancomycin 9/24>>08/20/12 Zosyn 9/24>>08/20/12 Ancef 08/20/12 (MSSA sputum) >> (4 more days)  Tests/events: 9//23 Echo>>EF 25 to 30% 9/24 VDRF, pulmonary infiltrates, pleural effusion, chest tube 9/24 Rt pleural fluid>>protein 2.9, LDH 124, glucose 153, WBC 1452 (3N, 89L, 24M, 0E).  Cytology negative for malignancy 9/24 CT chest>>patchy perihilar ASD b/l, moderate right pleural effusion 9/27 - pacer sheath removed 9/28 - extubated. Off pressors 9/29 - chylous pleural  noticed  10/3 CT Chest>>>Avidly enhancing, centrally necrotic high left paratracheal lymph node just posterior to the left clavicular head is concerning for nodal recurrence. No additional mediastinal, or hilar adenopathy identified. This node is potentially amendable to ultrasound-guided fine needle aspiration, although the location deep in the thoracic inlet and posterior to the clavicle will be limiting. Additionally, core biopsy may not be possible given the surrounding vascular structures.  Prominent bronchial wall thickening versus lymphoid  tissue along the right mainstem bronchus and proximal right upper, middle and lower lobe bronchi is not avidly enhancing, and is not appear significantly different compared to 08/15/2012. This likely represents residual treated disease/post radiation changes.  Interval resolution of bilateral pleural effusions.  Right thoracostomy tube in place. There is a persistent small right-sided pneumothorax with pleural air apically, medially and subpulmonically.  Multiple small thyroid nodules are nonspecific by CT. One nodule in the posterior left thyroid lobe is associated with some coarse calcification which is unchanged compared to 08/15/2012 but new compared to 12/31/2008. 5. Mild radiation fibrosis in the medial aspects of the bilateral upper and lower lobes.  Atherosclerotic vascular disease including coronary artery disease.  Subjective: Pt reports he wants to eat/ drink.  Did not sleep last night due to staff interruptions.  No acute complaints   Vital Signs: Temp:  [97.8 F (36.6 C)-98.3 F (36.8 C)] 98 F (36.7 C) (10/04 0444) Pulse Rate:  [62-67] 64  (10/04 1010) Resp:  [18-20] 20  (10/04 0444) BP: (87-114)/(54-68) 100/65 mmHg (10/04 1010) SpO2:  [96 %-99 %] 96 % (10/04 0444)  I/O last 3 completed shifts: In: 1060 [P.O.:820; I.V.:240] Out: 2420 [Urine:2200; Chest Tube:220]    Physical Examination: General - no distress HEENT - /AT, PERRL, EOM-I, -LAN and -thyromegally. Cardiac - s2 s2 regular.  Chest - no wheeze, Rt chest tube in place Abd - soft, non tender Ext - no edema Neuro - Alert and oriented x3  BMET  Lab 08/25/12 0535 08/24/12 0400 08/23/12 0420 08/22/12 0450 08/21/12 0430 08/20/12 0400  NA 130* 128* 131* 131* 131* --  K 4.0 4.1 -- -- -- --  CL 94* 92* 94*  91* 93* --  CO2 26 27 28 28 29  --  GLUCOSE 111* 93 99 103* 93 --  BUN 13 13 11 12 10  --  CREATININE 0.70 0.71 0.70 0.76 0.72 --  CALCIUM 8.8 9.1 9.2 9.2 8.9 --  MG 2.3 -- 2.3 2.3 2.4 2.4  PHOS 3.5 -- 4.0 4.2  4.0 3.7   CBC  Lab 08/25/12 0535 08/23/12 0420 08/22/12 0450  HGB 14.5 13.9 14.2  HCT 41.2 39.3 41.0  WBC 9.5 9.2 10.5  PLT 348 299 301    Lab Results  Component Value Date   ALT 8 08/17/2012   AST 12 08/17/2012   ALKPHOS 42 08/17/2012   BILITOT 0.6 08/17/2012   BNP  Lab 08/21/12 0930  PROBNP 2216.0*   CBG (last 3)   Basename 08/25/12 0807 08/25/12 0446 08/25/12 0356  GLUCAP 122* 130* 129*     ASSESSMENT AND PLAN  A:  Acute respiratory failure with pulmonary infiltrates, and right pleural effusion.  Rt pleural effusion noted to appear purulent, but numbers show transudate with predominance of lymphocytes and relatively high trigs (cut off for chylothorax is 110 and patient's is 97 but without eating that number can be falsely lowered), and cytology negative for malignancy.  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  P: Incentive spirometry, pulmonary toilet. IR here does not perform an lymphangiogram with coiling. NPO and TPN for chylo.  A: Shock ?cardiogenic vs septic.  Resolved, now borderline BP due to medications.  P: Per cards.  A: Recurrent VT with STEMI.   Systolic CHF.    P Amio to PO. Per cardiology and EP cardiology.  A:  RENAL  Intake/Output Summary (Last 24 hours) at 08/25/12 1114 Last data filed at 08/25/12 0900  Gross per 24 hour  Intake    240 ml  Output   1130 ml  Net   -890 ml  Hypokalemia Hypomagnesemia Hyponatremia  P: f/u BMET Continue to hold additional lasix.  A:  Hyperglycemia. P: Change to Ac and HS on 9/29  A:  INFECTIOUS DISEASES No results found for this basename: PROCALCITON:5 in the last 168 hours MSSA in sputum  P Ancef course of MSSA complete, afebrile and stable.  A:  Nutrition. P: Will continue TPN and make NPO for chylothorax.   Canary Brim, NP-C [facility] Pgr: [phone] or [phone]  If output remains high over the next few days then  will need to consider sending patient to a facility where they can perform a lymphangiogram and coiling.  Will continue to follow with you.  Patient seen and examined, agree with above note.  I dictated the care and orders written for this patient under my direction.  [name], M.D. [phone]

## 2012-08-25 NOTE — Progress Notes (Signed)
4098 Came to walk with pt. Pt stated he has not had any rest since 0330. Asked that I come back. Will followup after lunch.Sherif Millspaugh DunlapRN

## 2012-08-26 LAB — BASIC METABOLIC PANEL
Calcium: 8.9 mg/dL (ref 8.4–10.5)
GFR calc Af Amer: >90 mL/min (ref >90)
GFR calc non Af Amer: >90 mL/min (ref >90)
Glucose, Bld: 119 mg/dL — ABNORMAL HIGH (ref 70–99)
Potassium: 4.2 mEq/L (ref 3.5–5.1)
Sodium: 132 mEq/L — ABNORMAL LOW (ref 135–145)

## 2012-08-26 LAB — GLUCOSE, CAPILLARY
Glucose-Capillary: 134 mg/dL — ABNORMAL HIGH (ref 70–99)
Glucose-Capillary: 123 mg/dL — ABNORMAL HIGH (ref 70–99)
Glucose-Capillary: 140 mg/dL — ABNORMAL HIGH (ref 70–99)
Glucose-Capillary: 140 mg/dL — ABNORMAL HIGH (ref 70–99)

## 2012-08-26 MED ORDER — INSULIN ASPART 100 UNIT/ML ~~LOC~~ SOLN
0.0000 [IU] | SUBCUTANEOUS | Status: DC
  Administered 2012-08-26 (×3): 2 [IU] via SUBCUTANEOUS
  Administered 2012-08-27: 3 [IU] via SUBCUTANEOUS
  Administered 2012-08-27 – 2012-08-28 (×8): 2 [IU] via SUBCUTANEOUS
  Administered 2012-08-28: 3 [IU] via SUBCUTANEOUS
  Administered 2012-08-29 – 2012-08-30 (×9): 2 [IU] via SUBCUTANEOUS
  Administered 2012-08-30: 5 [IU] via SUBCUTANEOUS
  Administered 2012-08-30 – 2012-08-31 (×5): 2 [IU] via SUBCUTANEOUS
  Administered 2012-08-31 (×2): 3 [IU] via SUBCUTANEOUS
  Administered 2012-08-31: 2 [IU] via SUBCUTANEOUS
  Administered 2012-09-01 (×5): 3 [IU] via SUBCUTANEOUS
  Administered 2012-09-01: 5 [IU] via SUBCUTANEOUS
  Administered 2012-09-01: 2 [IU] via SUBCUTANEOUS
  Administered 2012-09-02 – 2012-09-03 (×5): 3 [IU] via SUBCUTANEOUS
  Administered 2012-09-04 – 2012-09-05 (×2): 2 [IU] via SUBCUTANEOUS

## 2012-08-26 MED ORDER — CLINIMIX E/DEXTROSE (5/20) 5 % IV SOLN
INTRAVENOUS | Status: AC
  Administered 2012-08-26: 18:00:00 via INTRAVENOUS
  Filled 2012-08-26: qty 2000

## 2012-08-26 NOTE — Progress Notes (Signed)
PARENTERAL NUTRITION CONSULT NOTE - FOLLOW UP  Pharmacy Consult for TPN Indication: Chylothorax  No Known Allergies  Patient Measurements: Height: 6' (182.9 cm) Weight: 217 lb 6 oz (98.6 kg) (standing weight scale) IBW/kg (Calculated) : 77.6  Adjusted Body Weight: 83.9 kg Usual Weight: 98.6 kg  Vital Signs: Temp: 98.6 F (37 C) (10/05 0500) Temp src: Oral (10/05 0500) BP: 108/69 mmHg (10/05 0500) Pulse Rate: 66  (10/05 0500) Intake/Output from previous day: 10/04 0701 - 10/05 0700 In: 0  Out: 1526 [Urine:1300; Stool:1; Chest Tube:225] Intake/Output from this shift: Total I/O In: -  Out: 300 [Urine:300]  Labs:  [facility] 08/25/12 0535  WBC 9.5  HGB 14.5  HCT 41.2  PLT 348  APTT --  INR --     Basename 08/26/12 0530 08/25/12 0900 08/25/12 0535 08/24/12 0400  NA 132* -- 130* 128*  K 4.2 -- 4.0 4.1  CL 96 -- 94* 92*  CO2 28 -- 26 27  GLUCOSE 119* -- 111* 93  BUN 15 -- 13 13  CREATININE 0.69 -- 0.70 0.71  LABCREA -- -- -- --  CREAT24HRUR -- -- -- --  CALCIUM 8.9 -- 8.8 9.1  MG -- -- 2.3 --  PHOS -- -- 3.5 --  PROT -- -- -- --  ALBUMIN -- -- -- --  AST -- -- -- --  ALT -- -- -- --  ALKPHOS -- -- -- --  BILITOT -- -- -- --  BILIDIR -- -- -- --  IBILI -- -- -- --  PREALBUMIN -- 22.4 -- --  TRIG -- -- -- --  CHOLHDL -- -- -- --  CHOL -- -- -- --   Estimated Creatinine Clearance: 128.4 ml/min (by C-G formula based on Cr of 0.69).    Basename 08/26/12 0810 08/26/12 0520 08/26/12 0028  GLUCAP 123* 134* 117*    Insulin Requirements in the past 24 hours:  9 units on moderate SSI TIDac   Current Nutrition:  Changing to NPO   Nutritional Goals: 2100-2300 kcal and 100-120 protein per RD note 10/4 Clinimix E 5/20 2ml/hr with lipids MWF (due to national backorder) averages: 1958 kcal/day and 99.4g protein/day.  Assessment:  56yo M admitted 9/22 with syncope and sustained VT with STEMI, emergent cath/PCI, stent, temp. pacer. Developed respiratory  distress with VDRF, found to have PNA and moderate R pleural effusion on CT chest. Now found to have 300 cc chylous pleural fluid out of chest tube in 12h. Diet changed to NPO and orders for TNA per pharmacy. Note prealbumin 22.4 WNL.  GI: NPO, PO MVI, PO PPI   Endo: CBG 100-134 with one outlier 182  on SSI.  Lytes:  Na chronically low but up 132; Mg 2.3 on magox 400 mg bid and K=4.2 on KCL 30 meq bid standing   Renal: Stable with Scr at 0.69  Hepatobil: LFTs ok. Chol 156 and Trig 128  Pulm: RA on Xopenex,   Neuro: WNL   Cards: h/o systolic CHF with EF 30%. STEMI s/p PCI on amiodarone, ASA 81mg , lisinopril, Magox, metoprolol, K, Brilinta. VSS  ID: s/p ancef for MSSA in sputum 9/24; now Afebrile with WBC 9.5  Heme/Onc: H/o small celll lung cancer. His chest CT was reviewed and there are no clear signs of recurrent cancer. Needs outpt. PET scan.  Best Practices:  TPN access: central line triple lumen Po PPI DVT prophx: SCDs   Plan:  - Clinimix E 5/20 at 83 ml/hr. No changes today - Provide lipids at  10 ml/hr MWF only 2/2 national shortage  - No SSI for CBG<150 - Continue po MVI daily for now 2/2 national shortage of IV MVI and TE   Jesstin Studstill S. Merilynn Finland, PharmD, BCPS Clinical Staff Pharmacist Pager [phone]  Misty Stanley Stillinger 08/26/2012,8:47 AM

## 2012-08-26 NOTE — Progress Notes (Signed)
Subjective: Pt stable overnight with no new complaints.  No increased wob.  Chest tube still draining.   Objective: Vital signs in last 24 hours: Blood pressure 108/69, pulse 66, temperature 98.6 F (37 C), temperature source Oral, resp. rate 18, height 6' (1.829 m), weight 98.6 kg (217 lb 6 oz), SpO2 95.00%.  Intake/Output from previous day: 10/04 0701 - 10/05 0700 In: 0  Out: 1526 [Urine:1300; Stool:1; Chest Tube:225]   Physical Exam:   wd male in nad Nose without purulence or discharge noted. Chest with crackles on right, o/w clear. Cor with rrr abd benign LE without significant edema, no cyanosis Alert and oriented, moves all 4.   Lab Results:  [facility] 08/25/12 0535  WBC 9.5  HGB 14.5  HCT 41.2  PLT 348   BMET  Basename 08/26/12 0530 08/25/12 0535 08/24/12 0400  NA 132* 130* 128*  K 4.2 4.0 4.1  CL 96 94* 92*  CO2 28 26 27   GLUCOSE 119* 111* 93  BUN 15 13 13   CREATININE 0.69 0.70 0.71  CALCIUM 8.9 8.8 9.1    Studies/Results: No results found.  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  Will continue with conservative therapy, and hope this resolves.  Will check again on Monday.   Barbaraann Share, M.D. 08/26/2012, 11:49 AM

## 2012-08-26 NOTE — Progress Notes (Signed)
CARDIAC REHAB PHASE I   PRE:  Rate/Rhythm: Sinus 64  BP:    Sitting:100/60    SaO2: 97 Room Air  MODE:  Ambulation: 850 ft   POST:  Rate/Rhythem: 74  BP:  Sitting: 118/60       SaO2: 99% Room Air  1040-1105  Patient walked in hallway using rolling walker Chest tube, IV intact. Patient tolerated walk without complaints assisted back to bed with call light within reach.    Harlon Flor, Arta Bruce

## 2012-08-26 NOTE — Progress Notes (Signed)
Subjective: No complaints  Objective: Vital signs in last 24 hours: Temp:  [97.3 F (36.3 C)-98.6 F (37 C)] 98.6 F (37 C) (10/05 0500) Pulse Rate:  [59-68] 66  (10/05 0500) Resp:  [18] 18  (10/05 0500) BP: (88-110)/(52-70) 108/69 mmHg (10/05 0500) SpO2:  [95 %-96 %] 95 % (10/05 0500) Weight change:  Last BM Date: 08/25/12 Intake/Output from previous day:  -1526 10/04 0701 - 10/05 0700 In: 0  Out: 1526 [Urine:1300; Stool:1; Chest Tube:225] Intake/Output this shift: Total I/O In: -  Out: 300 [Urine:300]  PE: General:alert and oriented Heart:S1S2 RRR Lungs:clear without rales, chest tube with serous to milky drainage Abd:+ BS, soft, non tender Ext:no edema    Lab Results:  [facility] 08/25/12 0535  WBC 9.5  HGB 14.5  HCT 41.2  PLT 348   BMET  Basename 08/26/12 0530 08/25/12 0535  NA 132* 130*  K 4.2 4.0  CL 96 94*  CO2 28 26  GLUCOSE 119* 111*  BUN 15 13  CREATININE 0.69 0.70  CALCIUM 8.9 8.8     Lab Results  Component Value Date   CHOL 156 08/22/2012   HDL 35* 08/16/2012   LDLCALC 74 08/16/2012   TRIG 128 08/22/2012   CHOLHDL 3.7 08/16/2012   Lab Results  Component Value Date   HGBA1C 5.9* 08/13/2012     Lab Results  Component Value Date   TSH 5.724* 08/13/2012      EKG: Orders placed during the hospital encounter of 08/13/12  . EKG 12-LEAD  . EKG 12-LEAD  . EKG 12-LEAD  . EKG 12-LEAD  . EKG 12-LEAD  . EKG 12-LEAD  . EKG 12-LEAD  . EKG 12-LEAD  . EKG 12-LEAD  . EKG 12-LEAD  . EKG  . EKG 12-LEAD  . EKG 12-LEAD  . EKG 12-LEAD  . EKG 12-LEAD    Studies/Results: No results found.  Medications: I have reviewed the patient's current medications.    Marland Kitchen amiodarone  400 mg Oral Daily  . aspirin EC  81 mg Oral Daily  . insulin aspart  0-15 Units Subcutaneous Q4H  . lisinopril  2.5 mg Oral Daily  . magnesium oxide  400 mg Per NG tube BID  . metoprolol tartrate  12.5 mg Oral TID  . multivitamin with minerals  1 tablet Oral Daily  .  pantoprazole  40 mg Oral Q0600  . potassium chloride  30 mEq Oral BID  . sodium chloride  10-40 mL Intracatheter Q12H  . Ticagrelor  90 mg Oral BID  . DISCONTD: insulin aspart  0-15 Units Subcutaneous Q4H  . DISCONTD: lisinopril  5 mg Oral Daily  . DISCONTD: metoprolol tartrate  12.5 mg Oral TID   Assessment/Plan: Principal Problem:  *Sustained ventricular tachycardia with associated syncope; Monomorphic Active Problems:  Hx of cancer of lung, 3 years ago treated with radiation and chemo   GERD (gastroesophageal reflux disease)  Hyperglycemia, pt has been on meds for diabetes in the past  Cardiomyopathy, ischemic, by cardiac cath 30-35% 08/13/12   CAD (coronary artery disease) 3 vessel disease  S/P coronary artery stent placement, acutely to RCA for acute Post. 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 Percival Spanish note from  yesterday. BP improved with holding lisinopril yesterday, resuming at lower dose today. TNA infusing.    LOS: 13 days   [name] [name] R 08/26/2012, 9:39 AM

## 2012-08-26 NOTE — Progress Notes (Signed)
Pt. Seen and examined. Agree with the NP/PA-C note as written.  TPN started .Marland Kitchen Hopeful to see improvement of chylothorax, otherwise, may need thoracic duct ligation or coiling as per PCCM recommendations and CT surgery.  Chrystie Nose, MD, [facility] Attending Cardiologist [facility]

## 2012-08-27 LAB — GLUCOSE, CAPILLARY
Glucose-Capillary: 139 mg/dL — ABNORMAL HIGH (ref 70–99)
Glucose-Capillary: 141 mg/dL — ABNORMAL HIGH (ref 70–99)
Glucose-Capillary: 171 mg/dL — ABNORMAL HIGH (ref 70–99)
Glucose-Capillary: 126 mg/dL — ABNORMAL HIGH (ref 70–99)
Glucose-Capillary: 147 mg/dL — ABNORMAL HIGH (ref 70–99)

## 2012-08-27 MED ORDER — CLINIMIX E/DEXTROSE (5/20) 5 % IV SOLN
INTRAVENOUS | Status: AC
  Administered 2012-08-27: 17:00:00 via INTRAVENOUS
  Filled 2012-08-27: qty 2000

## 2012-08-27 NOTE — Progress Notes (Signed)
Pt. Seen and examined. Agree with the NP/PA-C note as written.  OK to remove IJ TLC. Continue chest tube and TPN per PCCM.  Chrystie Nose, MD, [facility] Attending Cardiologist [facility]

## 2012-08-27 NOTE — Progress Notes (Signed)
PARENTERAL NUTRITION CONSULT NOTE - FOLLOW UP  Pharmacy Consult for TPN Indication: Chylothorax  No Known Allergies  Patient Measurements: Height: 6' (182.9 cm) Weight: 217 lb 6 oz (98.6 kg) (standing weight scale) IBW/kg (Calculated) : 77.6  Adjusted Body Weight: 83.9 kg Usual Weight: 98.6 kg  Vital Signs: Temp: 98.2 F (36.8 C) (10/06 0424) Temp src: Oral (10/06 0424) BP: 92/56 mmHg (10/06 0432) Pulse Rate: 59  (10/06 0432) Intake/Output from previous day: 10/05 0701 - 10/06 0700 In: -  Out: 1600 [Urine:1250; Chest Tube:350] Intake/Output from this shift:    Labs:  [facility] 08/25/12 0535  WBC 9.5  HGB 14.5  HCT 41.2  PLT 348  APTT --  INR --     Basename 08/26/12 0530 08/25/12 0900 08/25/12 0535  NA 132* -- 130*  K 4.2 -- 4.0  CL 96 -- 94*  CO2 28 -- 26  GLUCOSE 119* -- 111*  BUN 15 -- 13  CREATININE 0.69 -- 0.70  LABCREA -- -- --  CREAT24HRUR -- -- --  CALCIUM 8.9 -- 8.8  MG -- -- 2.3  PHOS -- -- 3.5  PROT -- -- --  ALBUMIN -- -- --  AST -- -- --  ALT -- -- --  ALKPHOS -- -- --  BILITOT -- -- --  BILIDIR -- -- --  IBILI -- -- --  PREALBUMIN -- 22.4 --  TRIG -- -- --  CHOLHDL -- -- --  CHOL -- -- --   Estimated Creatinine Clearance: 128.4 ml/min (by C-G formula based on Cr of 0.69).    Basename 08/27/12 0421 08/27/12 0018 08/26/12 2008  GLUCAP 139* 137* 140*    Insulin Requirements in the past 24 hours:  14 units on moderate SSI TIDac   Current Nutrition:  Changing to NPO   Nutritional Goals: 2100-2300 kcal and 100-120 protein per RD note 10/4 Clinimix E 5/20 40ml/hr with lipids MWF (due to national backorder) averages: 1958 kcal/day and 99.4g protein/day.  Assessment:  56yo M admitted 9/22 with syncope and sustained VT with STEMI, emergent cath/PCI, stent, temp. pacer. Developed respiratory distress with VDRF, found to have PNA and moderate R pleural effusion on CT chest. Now found to have 300 cc chylous pleural fluid out of chest  tube in 12h. Diet changed to NPO and orders for TNA per pharmacy. Note prealbumin 22.4 WNL.  GI: NPO, PO MVI, PO PPI   Endo: CBG 123-139 with on SSI   Lytes:  Na chronically low but up 132; Mg 2.3 on magox 400 mg bid and K=4.2 on KCL 30 meq bid standing   Renal: Stable with Scr at 0.69  Hepatobil: LFTs ok. Chol 156 and Trig 128  Pulm: RA on Xopenex,   Neuro: WNL   Cards: h/o systolic CHF with EF 30%. STEMI s/p PCI on amiodarone, ASA 81mg , lisinopril, Magox, metoprolol, K, Brilinta. BP soft this am 92/56 with HR 59  ID: s/p ancef for MSSA in sputum 9/24; now Afebrile with WBC 9.5  Heme/Onc: H/o small celll lung cancer. His chest CT was reviewed and there are no clear signs of recurrent cancer. Needs outpt. PET scan.  Best Practices:  TPN access: central line triple lumen Po PPI DVT prophx: SCDs   Plan:  - Clinimix E 5/20 at 83 ml/hr. No changes today - Provide lipids at 10 ml/hr MWF only 2/2 national shortage  - No SSI for CBG<150 - Continue po MVI daily for now 2/2 national shortage of IV MVI and TE  [name] S. Merilynn Finland, PharmD, [facility] Clinical Staff Pharmacist Pager [phone]  Misty Stanley Stillinger 08/27/2012,8:16 AM

## 2012-08-27 NOTE — Progress Notes (Signed)
Subjective: No specific complaints. Ambulates with cardiac rehab with rolling walker.  Objective: Vital signs in last 24 hours: Temp:  [97 F (36.1 C)-98.2 F (36.8 C)] 98.2 F (36.8 C) (10/06 0424) Pulse Rate:  [59-63] 59  (10/06 0432) Resp:  [18] 18  (10/06 0424) BP: (85-132)/(47-60) 92/56 mmHg (10/06 0432) SpO2:  [96 %-100 %] 96 % (10/06 0424) Weight change:  Last BM Date: 08/25/12 Intake/Output from previous day: -1600  (350 from chest tube) 10/05 0701 - 10/06 0700 In: -  Out: 1600 [Urine:1250; Chest Tube:350] Intake/Output this shift:    PE: General:alert and oriented pleasant affect, only discomfort is at chest tube sites Heart:S1S2 RRR Lungs:clear without rales rhonchi or wheezes Abd:+ BS, soft non tender Ext:no edema Chest tube with dark serous drainage PICC line in place Lt. RIJ has been in since 08/15/12    Lab Results:  [facility] 08/25/12 0535  WBC 9.5  HGB 14.5  HCT 41.2  PLT 348   BMET  Basename 08/26/12 0530 08/25/12 0535  NA 132* 130*  K 4.2 4.0  CL 96 94*  CO2 28 26  GLUCOSE 119* 111*  BUN 15 13  CREATININE 0.69 0.70  CALCIUM 8.9 8.8   No results found for this basename: TROPONINI:2,CK,MB:2 in the last 72 hours  Lab Results  Component Value Date   CHOL 156 08/22/2012   HDL 35* 08/16/2012   LDLCALC 74 08/16/2012   TRIG 128 08/22/2012   CHOLHDL 3.7 08/16/2012   Lab Results  Component Value Date   HGBA1C 5.9* 08/13/2012     Lab Results  Component Value Date   TSH 5.724* 08/13/2012    Hepatic Function Panel No results found for this basename: PROT,ALBUMIN,AST,ALT,ALKPHOS,BILITOT,BILIDIR,IBILI in the last 72 hours No results found for this basename: CHOL in the last 72 hours No results found for this basename: PROTIME in the last 72 hours    EKG: Orders placed during the hospital encounter of 08/13/12  . EKG 12-LEAD  . EKG 12-LEAD  . EKG 12-LEAD  . EKG 12-LEAD  . EKG 12-LEAD  . EKG 12-LEAD  . EKG 12-LEAD  . EKG 12-LEAD  . EKG  12-LEAD  . EKG 12-LEAD  . EKG  . EKG 12-LEAD  . EKG 12-LEAD  . EKG 12-LEAD  . EKG 12-LEAD    Studies/Results: No results found.  Medications: I have reviewed the patient's current medications. Scheduled Meds:   . amiodarone  400 mg Oral Daily  . aspirin EC  81 mg Oral Daily  . insulin aspart  0-15 Units Subcutaneous Q4H  . lisinopril  2.5 mg Oral Daily  . magnesium oxide  400 mg Per NG tube BID  . metoprolol tartrate  12.5 mg Oral TID  . multivitamin with minerals  1 tablet Oral Daily  . pantoprazole  40 mg Oral Q0600  . potassium chloride  30 mEq Oral BID  . sodium chloride  10-40 mL Intracatheter Q12H  . Ticagrelor  90 mg Oral BID  . DISCONTD: insulin aspart  0-15 Units Subcutaneous Q4H   Continuous Infusions:   . sodium chloride 20 mL/hr at 08/24/12 0600  . fat emulsion 250 mL (08/25/12 1838)  . TPN (CLINIMIX) +/- additives 83 mL/hr at 08/25/12 1838  . TPN (CLINIMIX) +/- additives 83 mL/hr at 08/26/12 1810   PRN Meds:.acetaminophen, ALPRAZolam, diphenhydrAMINE, levalbuterol, ondansetron (ZOFRAN) IV, sodium chloride, traMADol, zolpidem  Assessment/Plan: Principal Problem:  *Sustained ventricular tachycardia with associated syncope; Monomorphic Active Problems:  Hx of cancer of lung,  3 years ago treated with radiation and chemo   GERD (gastroesophageal reflux disease)  Hyperglycemia, pt has been on meds for diabetes in the past  Cardiomyopathy, ischemic, by cardiac cath 30-35% 08/13/12   CAD (coronary artery disease) 3 vessel disease  S/P coronary artery stent placement, acutely to RCA for acute Post. 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  BP now borderline.  Remove Rt. IJ if ok with Dr. Rennis Golden has been in since 9/24. Continue ambulation.   LOS: 14 days   [name] [name] 08/27/2012, 7:54 AM

## 2012-08-28 ENCOUNTER — Inpatient Hospital Stay (HOSPITAL_COMMUNITY): Payer: BC Managed Care – PPO

## 2012-08-28 DIAGNOSIS — I898 Other specified noninfective disorders of lymphatic vessels and lymph nodes: Secondary | ICD-10-CM

## 2012-08-28 LAB — GLUCOSE, CAPILLARY
Glucose-Capillary: 117 mg/dL — ABNORMAL HIGH (ref 70–99)
Glucose-Capillary: 129 mg/dL — ABNORMAL HIGH (ref 70–99)
Glucose-Capillary: 155 mg/dL — ABNORMAL HIGH (ref 70–99)

## 2012-08-28 LAB — COMPREHENSIVE METABOLIC PANEL
AST: 13 U/L (ref 0–37)
Albumin: 2.7 g/dL — ABNORMAL LOW (ref 3.5–5.2)
BUN: 15 mg/dL (ref 6–23)
Calcium: 8.8 mg/dL (ref 8.4–10.5)
Creatinine, Ser: 0.62 mg/dL (ref 0.50–1.35)
Total Bilirubin: 0.6 mg/dL (ref 0.3–1.2)
Total Protein: 6.2 g/dL (ref 6.0–8.3)

## 2012-08-28 LAB — PREALBUMIN: Prealbumin: 18.7 mg/dL (ref 17.0–34.0)

## 2012-08-28 LAB — CBC
MCV: 89.2 fL (ref 78.0–100.0)
Platelets: 387 10*3/uL (ref 150–400)
RDW: 13.5 % (ref 11.5–15.5)
WBC: 12.4 10*3/uL — ABNORMAL HIGH (ref 4.0–10.5)

## 2012-08-28 LAB — MAGNESIUM: Magnesium: 2.2 mg/dL (ref 1.5–2.5)

## 2012-08-28 LAB — TSH: TSH: 11.427 u[IU]/mL — ABNORMAL HIGH (ref 0.350–4.500)

## 2012-08-28 LAB — T3, FREE: T3, Free: 2.2 pg/mL — ABNORMAL LOW (ref 2.3–4.2)

## 2012-08-28 LAB — PHOSPHORUS: Phosphorus: 3.3 mg/dL (ref 2.3–4.6)

## 2012-08-28 LAB — T4, FREE: Free T4: 0.83 ng/dL (ref 0.80–1.80)

## 2012-08-28 MED ORDER — FAT EMULSION 20 % IV EMUL
250.0000 mL | INTRAVENOUS | Status: AC
  Administered 2012-08-28: 250 mL via INTRAVENOUS
  Filled 2012-08-28: qty 250

## 2012-08-28 MED ORDER — CLINIMIX E/DEXTROSE (5/20) 5 % IV SOLN
INTRAVENOUS | Status: AC
  Administered 2012-08-28: 18:00:00 via INTRAVENOUS
  Filled 2012-08-28: qty 2000

## 2012-08-28 MED ORDER — AMIODARONE HCL 200 MG PO TABS
400.0000 mg | ORAL_TABLET | Freq: Two times a day (BID) | ORAL | Status: DC
  Administered 2012-08-28 – 2012-09-07 (×20): 400 mg via ORAL
  Filled 2012-08-28 (×21): qty 2

## 2012-08-28 NOTE — Progress Notes (Signed)
15 Days Post-Op Procedure(s) (LRB): LEFT HEART CATHETERIZATION WITH CORONARY ANGIOGRAM (N/A) TEMPORARY PACEMAKER INSERTION (Right) PERCUTANEOUS CORONARY STENT INTERVENTION (PCI-S) (Right) Subjective: Hungry   Objective: Vital signs in last 24 hours: Temp:  [97.2 F (36.2 C)-98.9 F (37.2 C)] 98.9 F (37.2 C) (10/07 0527) Pulse Rate:  [64-74] 74  (10/07 0527) Cardiac Rhythm:  [-] Normal sinus rhythm (10/07 0830) Resp:  [16-17] 17  (10/07 0527) BP: (90-104)/(50-61) 90/50 mmHg (10/07 0527) SpO2:  [94 %-100 %] 94 % (10/07 0527)  Hemodynamic parameters for last 24 hours:    Intake/Output from previous day: 10/06 0701 - 10/07 0700 In: -  Out: 1325 [Urine:1175; Chest Tube:150] Intake/Output this shift:    Heart: regular rate and rhythm, S1, S2 normal, no murmur, click, rub or gallop Lungs: clear to auscultation bilaterally  Lab Results:  Basename 08/28/12 0400  WBC 12.4*  HGB 14.0  HCT 40.6  PLT 387   BMET:  Basename 08/28/12 0400 08/26/12 0530  NA 129* 132*  K 4.1 4.2  CL 93* 96  CO2 29 28  GLUCOSE 129* 119*  BUN 15 15  CREATININE 0.62 0.69  CALCIUM 8.8 8.9    PT/INR: No results found for this basename: LABPROT,INR in the last 72 hours ABG    Component Value Date/Time   PHART 7.498* 08/17/2012 0447   HCO3 28.1* 08/17/2012 0447   TCO2 29 08/17/2012 0447   O2SAT 99.0 08/17/2012 0447   CBG (last 3)   Basename 08/28/12 0856 08/28/12 0530 08/28/12 0005  GLUCAP 135* 143* 117*    Assessment/Plan: S/P Procedure(s) (LRB): LEFT HEART CATHETERIZATION WITH CORONARY ANGIOGRAM (N/A) TEMPORARY PACEMAKER INSERTION (Right) PERCUTANEOUS CORONARY STENT INTERVENTION (PCI-S) (Right)  Right Chylothorax:  Chest tube output has decreased to 150cc for the past 24 hrs and is serous. Continue NPO on TNA.  Will put chest tube to water seal.   LOS: 15 days    BARTLE,BRYAN K 08/28/2012

## 2012-08-28 NOTE — Progress Notes (Addendum)
Nutrition Consult/Follow-up  Intervention:    Advance to Fat Modified diet as medically appropriate  Add Vital 1.0 Cal supplement (contains MCT oil) between meals when able RD to follow for nutrition care plan  Assessment:   RD consult for diet & oral supplement recommendation -- per Dr. Frederico Hamman chylothorax seems to be improving.  Patient is receiving TPN with Clinimix E 5/20 @ 83 ml/hr.  Lipids (20% IVFE @ 10 ml/hr), multivitamins, and trace elements are provided 3 times weekly (MWF) due to national backorder.  Provides 1959 kcal and 100 grams protein daily (based on weekly average).  Meets 93% minimum estimated kcal and 100% minimum estimated protein needs.  Diet Order:  NPO  Meds: Scheduled Meds:   . amiodarone  400 mg Oral BID  . aspirin EC  81 mg Oral Daily  . insulin aspart  0-15 Units Subcutaneous Q4H  . lisinopril  2.5 mg Oral Daily  . magnesium oxide  400 mg Per NG tube BID  . metoprolol tartrate  12.5 mg Oral TID  . multivitamin with minerals  1 tablet Oral Daily  . pantoprazole  40 mg Oral Q0600  . potassium chloride  30 mEq Oral BID  . sodium chloride  10-40 mL Intracatheter Q12H  . Ticagrelor  90 mg Oral BID  . DISCONTD: amiodarone  400 mg Oral Daily   Continuous Infusions:   . sodium chloride 20 mL/hr at 08/24/12 0600  . fat emulsion    . TPN (CLINIMIX) +/- additives 83 mL/hr at 08/26/12 1810  . TPN (CLINIMIX) +/- additives 83 mL/hr at 08/27/12 1712  . TPN (CLINIMIX) +/- additives     PRN Meds:.acetaminophen, ALPRAZolam, diphenhydrAMINE, levalbuterol, ondansetron (ZOFRAN) IV, sodium chloride, traMADol, zolpidem  Labs:  CMP     Component Value Date/Time   NA 129* 08/28/2012 0400   K 4.1 08/28/2012 0400   CL 93* 08/28/2012 0400   CO2 29 08/28/2012 0400   GLUCOSE 129* 08/28/2012 0400   BUN 15 08/28/2012 0400   CREATININE 0.62 08/28/2012 0400   CALCIUM 8.8 08/28/2012 0400   PROT 6.2 08/28/2012 0400   ALBUMIN 2.7* 08/28/2012 0400   AST 13 08/28/2012 0400   ALT 9  08/28/2012 0400   ALKPHOS 50 08/28/2012 0400   BILITOT 0.6 08/28/2012 0400   GFRNONAA >90 08/28/2012 0400   GFRAA >90 08/28/2012 0400    Lipid Panel     Component Value Date/Time   CHOL 156 08/22/2012 0840   TRIG 94 08/28/2012 0400   HDL 35* 08/16/2012 0410   CHOLHDL 3.7 08/16/2012 0410   VLDL 21 08/16/2012 0410   LDLCALC 74 08/16/2012 0410     Intake/Output Summary (Last 24 hours) at 08/28/12 1323 Last data filed at 08/28/12 1202  Gross per 24 hour  Intake      0 ml  Output   1400 ml  Net  -1400 ml    CBG (last 3)   Basename 08/28/12 1049 08/28/12 0856 08/28/12 0530  GLUCAP 155* 135* 143*    Weight Status:  98.6 kg (10/3) -- no new weight available  Estimated needs:  2100-2300 kcals, 100-120 gm protein  Nutrition Dx:  Altered GI function, improving  Goal:  TPN to meet >90% of estimated protein needs, maximize energy provision as able during national lipid backorder, met  Monitor:  TPN prescription, PO diet advancement, weight, labs, I/O's  Kirkland Hun, RD, LDN Pager #: [phone] After-Hours Pager #: [phone]

## 2012-08-28 NOTE — Progress Notes (Signed)
Pt had a sustained V-tach, Dr. Karleen Hampshire is aware. Will recheck labs in the am. Will continue to monitor.

## 2012-08-28 NOTE — Progress Notes (Signed)
Physical Therapy Treatment/Discharge Note Patient Details Name: [name] [name] MRN: [id number] DOB: 13-Jun-1956 Today's Date: 08/28/2012 Time: 4098-1191 PT Time Calculation (min): 15 min  PT Assessment / Plan / Recommendation Comments on Treatment Session  Pt able to amb well without rolling walker without difficulty.  No further PT needed.    Follow Up Recommendations  No PT follow up     Does the patient have the potential to tolerate intense rehabilitation     Barriers to Discharge        Equipment Recommendations  None recommended by PT    Recommendations for Other Services    Frequency     Plan Discharge plan needs to be updated;All goals met and education completed, patient dischaged from PT services    Precautions / Restrictions Precautions Precautions: None Restrictions Weight Bearing Restrictions: No   Pertinent Vitals/Pain VSS    Mobility  Bed Mobility Bed Mobility: Supine to Sit;Sitting - Scoot to Edge of Bed Supine to Sit: 7: Independent Sitting - Scoot to Edge of Bed: 7: Independent Details for Bed Mobility Assistance: No cues or assist needed. Transfers Sit to Stand: 7: Independent Stand to Sit: 7: Independent Details for Transfer Assistance: Pt safely managing chest tube. Ambulation/Gait Ambulation/Gait Assistance: 6: Modified independent (Device/Increase time) Ambulation Distance (Feet): 500 Feet Assistive device: None Ambulation/Gait Assistance Details: Pt carrying chest tube while I pushed IV pole.  Pt able to carry chest tube and push IV pole if needed. Gait Pattern: Within Functional Limits Stairs: Yes Stairs Assistance: 6: Modified independent (Device/Increase time) Stair Management Technique: One rail Left Number of Stairs: 3     Exercises     PT Diagnosis:    PT Problem List:   PT Treatment Interventions:     PT Goals Acute Rehab PT Goals PT Goal: Supine/Side to Sit - Progress: Met PT Goal: Sit to Stand - Progress: Met PT Goal:  Ambulate - Progress: Met PT Goal: Up/Down Stairs - Progress: Met  Visit Information  Last PT Received On: 08/28/12 Assistance Needed: +1    Subjective Data  Subjective: "Great," pt stated when I suggested walking without the rollling walker.   Cognition  Overall Cognitive Status: Appears within functional limits for tasks assessed/performed Arousal/Alertness: Awake/alert Orientation Level: Appears intact for tasks assessed Behavior During Session: [facility] for tasks performed    Balance  Static Standing Balance Static Standing - Balance Support: No upper extremity supported;During functional activity Static Standing - Level of Assistance: 7: Independent Dynamic Standing Balance Dynamic Standing - Balance Support: During functional activity;No upper extremity supported Dynamic Standing - Level of Assistance: 7: Independent  End of Session PT - End of Session Activity Tolerance: Patient tolerated treatment well Patient left: in chair;with call bell/phone within reach Nurse Communication: Mobility status   GP     [facility] 08/28/2012, 9:17 AM  [facility] PT [phone]

## 2012-08-28 NOTE — Progress Notes (Signed)
CARDIAC REHAB PHASE I   PRE:  Rate/Rhythm: 70 SR  BP:  Supine:   Sitting: 92/50  Standing:    SaO2: 99 RA  MODE:  Ambulation: 884ft   POST:  Rate/Rhythem: 68  BP:  Supine:   Sitting: 120/60  Standing:    SaO2: 99 RA 1355-1435 Assisted X 1 to ambulate. Pt a little wobbly and c/o after walk that he felt a little dizzy with walking. No c/o of cp or SOB. VS stable Pt back to recliner after walk with call light in reach and wife present.  Beatrix Fetters

## 2012-08-28 NOTE — Progress Notes (Signed)
Subjective: 72 sec. Of sustained v tach at 0236. Pt unaware of tach.  Objective: Vital signs in last 24 hours: Temp:  [97.2 F (36.2 C)-98.9 F (37.2 C)] 98.9 F (37.2 C) (10/07 0527) Pulse Rate:  [64-74] 74  (10/07 0527) Resp:  [16-17] 17  (10/07 0527) BP: (90-104)/(50-61) 90/50 mmHg (10/07 0527) SpO2:  [94 %-100 %] 94 % (10/07 0527) Weight change:  Last BM Date: 08/27/12 Intake/Output from previous day: -1325 ( 150 of chest tube drainage) 10/06 0701 - 10/07 0700 In: -  Out: 1325 [Urine:1175; Chest Tube:150] Intake/Output this shift:    PE: General:alert and oriented,pleasant affect Heart:S1S2 RRR Lungs:clear without rales or rhonchi.  serous chest tube drainage Abd:+ BS, soft, nontender Ext:no edema    Lab Results:  Basename 08/28/12 0400  WBC 12.4*  HGB 14.0  HCT 40.6  PLT 387   BMET  Basename 08/28/12 0400 08/26/12 0530  NA 129* 132*  K 4.1 4.2  CL 93* 96  CO2 29 28  GLUCOSE 129* 119*  BUN 15 15  CREATININE 0.62 0.69  CALCIUM 8.8 8.9   No results found for this basename: TROPONINI:2,CK,MB:2 in the last 72 hours  Lab Results  Component Value Date   CHOL 156 08/22/2012   HDL 35* 08/16/2012   LDLCALC 74 08/16/2012   TRIG 94 08/28/2012   CHOLHDL 3.7 08/16/2012   Lab Results  Component Value Date   HGBA1C 5.9* 08/13/2012     Lab Results  Component Value Date   TSH 5.724* 08/13/2012    Hepatic Function Panel  Basename 08/28/12 0400  PROT 6.2  ALBUMIN 2.7*  AST 13  ALT 9  ALKPHOS 50  BILITOT 0.6  BILIDIR --  IBILI --     EKG: Orders placed during the hospital encounter of 08/13/12  . EKG 12-LEAD  . EKG 12-LEAD  . EKG 12-LEAD  . EKG 12-LEAD  . EKG 12-LEAD  . EKG 12-LEAD  . EKG 12-LEAD  . EKG 12-LEAD  . EKG 12-LEAD  . EKG 12-LEAD  . EKG  . EKG 12-LEAD  . EKG 12-LEAD  . EKG 12-LEAD  . EKG 12-LEAD    Studies/Results: No results found.  Medications: I have reviewed the patient's current medications. Scheduled Meds:   .  amiodarone  400 mg Oral Daily  . aspirin EC  81 mg Oral Daily  . insulin aspart  0-15 Units Subcutaneous Q4H  . lisinopril  2.5 mg Oral Daily  . magnesium oxide  400 mg Per NG tube BID  . metoprolol tartrate  12.5 mg Oral TID  . multivitamin with minerals  1 tablet Oral Daily  . pantoprazole  40 mg Oral Q0600  . potassium chloride  30 mEq Oral BID  . sodium chloride  10-40 mL Intracatheter Q12H  . Ticagrelor  90 mg Oral BID   Continuous Infusions:   . sodium chloride 20 mL/hr at 08/24/12 0600  . fat emulsion    . TPN (CLINIMIX) +/- additives 83 mL/hr at 08/26/12 1810  . TPN (CLINIMIX) +/- additives 83 mL/hr at 08/27/12 1712  . TPN (CLINIMIX) +/- additives     PRN Meds:.acetaminophen, ALPRAZolam, diphenhydrAMINE, levalbuterol, ondansetron (ZOFRAN) IV, sodium chloride, traMADol, zolpidem  Assessment/Plan: Principal Problem:  *Sustained ventricular tachycardia with associated syncope; Monomorphic Active Problems:  Hx of cancer of lung, 3 years ago treated with radiation and chemo   GERD (gastroesophageal reflux disease)  Hyperglycemia, pt has been on meds for diabetes in the past  Cardiomyopathy, ischemic, by  cardiac cath 30-35% 08/13/12   CAD (coronary artery disease) 3 vessel disease  S/P coronary artery stent placement, acutely to RCA for acute Post. 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 Increase amiodarone  To continue NPO and TNA- See Dr. Sharee Pimple note.  LOS: 15 days   [name],[name] 08/28/2012, 8:55 AM

## 2012-08-28 NOTE — Progress Notes (Signed)
PARENTERAL NUTRITION CONSULT NOTE - FOLLOW UP  Pharmacy Consult: TPN Indication: Chylothorax  No Known Allergies  Patient Measurements: Height: 6' (182.9 cm) Weight: 217 lb 6 oz (98.6 kg) (standing weight scale) IBW/kg (Calculated) : 77.6  Adjusted Body Weight: 83.9 kg Usual Weight: 98.6 kg  Vital Signs: Temp: 98.9 F (37.2 C) (10/07 0527) Temp src: Oral (10/07 0527) BP: 90/50 mmHg (10/07 0527) Pulse Rate: 74  (10/07 0527) Intake/Output from previous day: 10/06 0701 - 10/07 0700 In: -  Out: 1325 [Urine:1175; Chest Tube:150]  Labs:  Basename 08/28/12 0400  WBC 12.4*  HGB 14.0  HCT 40.6  PLT 387  APTT --  INR --     Basename 08/28/12 0400 08/26/12 0530 08/25/12 0900  NA 129* 132* --  K 4.1 4.2 --  CL 93* 96 --  CO2 29 28 --  GLUCOSE 129* 119* --  BUN 15 15 --  CREATININE 0.62 0.69 --  LABCREA -- -- --  CREAT24HRUR -- -- --  CALCIUM 8.8 8.9 --  MG 2.2 -- --  PHOS 3.3 -- --  PROT 6.2 -- --  ALBUMIN 2.7* -- --  AST 13 -- --  ALT 9 -- --  ALKPHOS 50 -- --  BILITOT 0.6 -- --  BILIDIR -- -- --  IBILI -- -- --  PREALBUMIN -- -- 22.4  TRIG 94 -- --  CHOLHDL -- -- --  CHOL -- -- --   Estimated Creatinine Clearance: 128.4 ml/min (by C-G formula based on Cr of 0.62).    Basename 08/28/12 0530 08/28/12 0005 08/27/12 2125  GLUCAP 143* 117* 147*     Insulin Requirements in the past 24 hours:  13 units on moderate SSI TIDac   Current Nutrition:  TPN  Assessment:  20 YOM admitted 9/22 with syncope and sustained VT with STEMI, emergent cath/PCI, stent, temp. pacer. Developed respiratory distress with VDRF, found to have PNA and moderate right pleural effusion on chest CT.  Found to have 300 cc chylous pleural fluid out of chest tube in 12h on 08/24/12. Diet changed to NPO and orders for TNA per pharmacy.  Patient tolerating TPN and chyle output is decreasing based on documentation.   GI: intact, NPO/TPN to decrease chyle -- output decreased to  yesterday, PO multivitamin, PO PPI  Endo: no hx DM - CBGs acceptable Lytes: chronic hyponatremia - Na 129 today;  Cl also slightly low, on scheduled mag oxide 400 mg BID and  KCL 30 mEq BID Renal: SCr stable, CrCL ~128 ml/min, I/O's inaccurate, NS at [facility] Hepatobil: LFTs / TG WNL. Prealbumin WNL at 22.4 prior to TPN initiation Pulm: stable on RA - on Xopenex Neuro: WNL  Cards: hx systolic CHF (EF 45%). STEMI s/p PCI on amiodarone, ASA 81mg , lisinopril, Magox, metoprolol, K, Brilinta -- BP/HR remains soft, v-tach this AM ID: s/p Ancef for MSSA in sputum 9/24; afebrile, WBC up 12.4 Heme/Onc: hx small celll lung cancer; no clear signs of recurrent cancer per chest CT. Needs outpt PET scan - H/H/plts WNL Best Practices:  TPN access: central line triple lumen PO PPI VTE px: SCDs   Nutritional Goals:  2100-2300 kcal and 100-120 protein   Plan:  - Continue Clinimix E 5/20 at 83 ml/hr, providing 93% of minimal kCal and 100% of minimal protein needs - Provide lipids at 10 ml/hr MWF only due to national shortage  - No SSI for CBG<150 - Continue PO multivitamin daily (no MVI / trace elements in TPN) - F/U with  chyle output and the possibility of initiating EN/PO diet that contains minimal amount of long-chain triglycerides.     Camya Haydon D. Laney Potash, PharmD, BCPS Pager:  [phone] 08/28/2012, 7:30 AM

## 2012-08-28 NOTE — Progress Notes (Signed)
Pt. Seen and examined. Agree with the NP/PA-C note as written.  Chest tube output is more serous, per Dr. Laneta Simmers to water seal today. Continuing TPN. He may need water as well, sodium is falling. He had sustained monomorphic VT overnight for more than 1 minute for which he was apparently unaware. No intervention was made. Will increase PO amiodarone to 400 mg po BID. Mg is 2.2, K is 4.1. Sodium is low at 129.  Chrystie Nose, MD, [facility] Attending Cardiologist [facility]

## 2012-08-28 NOTE — Progress Notes (Signed)
Electrophysiology  Patient Name: [name] [name]      SUBJECTIVE: Events noted.  Recurrent VT on the monitor;  amio 400daily  The issue remains as to whether there is a recurrence of lung cancer that would affect decision to place an ICD  He expresses little understanding of what is going on Past Medical History  Diagnosis Date  . Cancer     lung  . STEMI (ST elevation myocardial infarction) 08/13/2012  . Sustained ventricular tachycardia with associated syncope 08/13/2012  . Hx of cancer of lung, 3 years ago treated with radiation and chemo and resolved 08/13/2012  . GERD (gastroesophageal reflux disease) 08/13/2012  . Hyperglycemia 08/13/2012  . Cardiomyopathy, ischemic, by cardiac cath 30-35% 08/13/12  08/13/2012  . CAD (coronary artery disease) 3 vessel disease 08/13/2012  . S/P coronary artery stent placement, acutely to RCA for acute Post. MI with BMS, 08/13/12 08/13/2012  . 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/25/2012    PHYSICAL EXAM Filed Vitals:   08/28/12 0527 08/28/12 0953 08/28/12 0954 08/28/12 1000  BP: 90/50 98/73 98/73    Pulse: 74  79 79  Temp: 98.9 F (37.2 C)   97.7 F (36.5 C)  TempSrc: Oral   Oral  Resp: 17  16   Height:      Weight:      SpO2: 94%  97%    Well developed and nourished in no acute distress HENT normal Neck supple with JVP-flat Carotids brisk and full without bruits Clear Chest tube in place Regular rate and rhythm, no murmurs or gallops Abd-soft with active BS without hepatomegaly No Clubbing cyanosis edema Skin-warm and dry A & Oriented  Grossly normal sensory and motor function   TELEMETRY: Reviewed telemetry pt in  VT last pm for >1 min;  HR 120 bpm or so   Intake/Output Summary (Last 24 hours) at 08/28/12 1107 Last data filed at 08/28/12 0600  Gross per 24 hour  Intake      0 ml  Output    800 ml  Net   -800 ml    LABS: Basic Metabolic Panel:  Lab 08/28/12 1610 08/26/12 0530 08/25/12 0535 08/24/12 0400 08/23/12 0420 08/22/12  0450  NA 129* 132* 130* 128* 131* 131*  K 4.1 4.2 4.0 4.1 3.8 3.9  CL 93* 96 94* 92* 94* 91*  CO2 29 28 26 27 28 28   GLUCOSE 129* 119* 111* 93 99 103*  BUN 15 15 13 13 11 12   CREATININE 0.62 0.69 0.70 0.71 0.70 0.76  CALCIUM 8.8 8.9 -- -- -- --  MG 2.2 -- 2.3 -- -- --  PHOS 3.3 -- 3.5 -- -- --   Cardiac Enzymes: No results found for this basename: CKTOTAL:3,CKMB:3,CKMBINDEX:3,TROPONINI:3 in the last 72 hours CBC:  Lab 08/28/12 0400 08/25/12 0535 08/23/12 0420 08/22/12 0450  WBC 12.4* 9.5 9.2 10.5  NEUTROABS -- -- -- --  HGB 14.0 14.5 13.9 14.2  HCT 40.6 41.2 39.3 41.0  MCV 89.2 88.4 88.3 89.1  PLT 387 348 299 301   PROTIME: No results found for this basename: LABPROT:3,INR:3 in the last 72 hours Liver Function Tests:  Basename 08/28/12 0400  AST 13  ALT 9  ALKPHOS 50  BILITOT 0.6  PROT 6.2  ALBUMIN 2.7*   No results found for this basename: LIPASE:2,AMYLASE:2 in the last 72 hours BNP: BNP (last 3 results)  Basename 08/21/12 0930 08/17/12 0445 08/14/12 0130  PROBNP 2216.0* 2574.0* 2610.0*   D-Dimer: No results found for this basename:  DDIMER:2 in the last 72 hours Hemoglobin A1C: No results found for this basename: HGBA1C in the last 72 hours Fasting Lipid Panel:  Basename 08/28/12 0400  CHOL --  HDL --  LDLCALC --  TRIG 94  CHOLHDL --  LDLDIRECT --      ASSESSMENT AND PLAN:  Patient Active Hospital Problem List: Sustained ventricular tachycardia with associated syncope; Monomorphic (08/13/2012)   Hx of cancer of lung, 3 years ago treated with radiation and chemo  (08/13/2012)   Hyperglycemia, pt has been on meds for diabetes in the past (08/13/2012)   Cardiomyopathy, ischemic, by cardiac cath 30-35% 08/13/12  (08/13/2012)   artery stent placement, acutely to RCA for acute Post. MI with BMS, 08/13/12 (08/13/2012)   Pt with recurrent sustained albeit asymptomatic VT  Will increase amio;  Already done to bid  Signed, Sherryl Manges MD  08/28/2012

## 2012-08-28 NOTE — Progress Notes (Signed)
Subjective: Pt stable overnight with no new complaints.  No increased wob.  Chest tube still draining.   Objective: Vital signs in last 24 hours: Blood pressure 92/56, pulse 68, temperature 97.9 F (36.6 C), temperature source Oral, resp. rate 16, height 6' (1.829 m), weight 217 lb 6 oz (98.6 kg), SpO2 96.00%.  Intake/Output from previous day: 10/06 0701 - 10/07 0700 In: -  Out: 1325 [Urine:1175; Chest Tube:150]   Physical Exam: BP 92/56  Pulse 68  Temp 97.9 F (36.6 C) (Oral)  Resp 16  Ht 6' (1.829 m)  Wt 217 lb 6 oz (98.6 kg)  BMI 29.48 kg/m2  SpO2 96%   wd male in nad Nose without purulence or discharge noted. Chest with crackles on right. Cor with rrr abd benign LE without significant edema, no cyanosis Alert and oriented, moves all 4.   Lab Results:  Basename 08/28/12 0400  WBC 12.4*  HGB 14.0  HCT 40.6  PLT 387   BMET  Basename 08/28/12 0400 08/26/12 0530  NA 129* 132*  K 4.1 4.2  CL 93* 96  CO2 29 28  GLUCOSE 129* 119*  BUN 15 15  CREATININE 0.62 0.69  CALCIUM 8.8 8.9    Studies/Results: Dg Chest 2 View  08/28/2012  *RADIOLOGY REPORT*  Clinical Data: Evaluate for residual pneumothorax.  Chest tube on water seal.  CHEST - 2 VIEW  Comparison: CT of 08/24/2012.  Plain film of 08/22/2012.  Findings: Moderate thoracic spondylosis.  Left-sided PICC line tip in mid SVC.  Right-sided chest tube remains in place.  The right apical pneumothorax is similar to 08/22/2012, approximately 10%.  Mild tracheal deviation right. Normal heart size.  No pleural fluid.  Paramediastinal radiation changes.  Lungs otherwise clear.  IMPRESSION: Right-sided chest tube remaining in place with similar 10% right apical pneumothorax.  New left-sided PICC line, tip mid SVC.   Original Report Authenticated By: Consuello Bossier, M.D.      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 Now fluid serous looking, no macroscopic evidence of chylous drainage; Triglycerides 50; draining anywhere from 70 to 300 cc/day. CXR with small residual pneumothorax. Would transition from tpn to low fat diet with medium chain triglycerides and follow up on chest tube drainage and pleural fluid triglycerides in 2-3 days after diet initiation; if no change in effusion would d/c chest tube; if rea cumulation/recurrent chylothorax after initiating diet would recommend talc pleurodesis before proceeding to chylous duct ligation/embolization. Nutrition consult placed, we will follow up on their recommendation. We will discuss plan with CT surgery.    Will continue with conservative therapy, and hope this resolves.   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 He refused to have a PET now or in the future. He would like to follow up in regards to his cancer with his primary oncologist.

## 2012-08-29 ENCOUNTER — Inpatient Hospital Stay (HOSPITAL_COMMUNITY): Payer: BC Managed Care – PPO

## 2012-08-29 DIAGNOSIS — I898 Other specified noninfective disorders of lymphatic vessels and lymph nodes: Secondary | ICD-10-CM

## 2012-08-29 LAB — CBC WITH DIFFERENTIAL/PLATELET
Basophils Absolute: 0.1 10*3/uL (ref 0.0–0.1)
Basophils Relative: 1 % (ref 0–1)
Eosinophils Absolute: 1 10*3/uL — ABNORMAL HIGH (ref 0.0–0.7)
Eosinophils Relative: 9 % — ABNORMAL HIGH (ref 0–5)
Lymphocytes Relative: 12 % (ref 12–46)
MCHC: 34.5 g/dL (ref 30.0–36.0)
MCV: 89.7 fL (ref 78.0–100.0)
Platelets: 463 10*3/uL — ABNORMAL HIGH (ref 150–400)
RDW: 13.4 % (ref 11.5–15.5)
WBC: 11.2 10*3/uL — ABNORMAL HIGH (ref 4.0–10.5)

## 2012-08-29 LAB — GLUCOSE, CAPILLARY
Glucose-Capillary: 122 mg/dL — ABNORMAL HIGH (ref 70–99)
Glucose-Capillary: 125 mg/dL — ABNORMAL HIGH (ref 70–99)
Glucose-Capillary: 137 mg/dL — ABNORMAL HIGH (ref 70–99)

## 2012-08-29 LAB — COMPREHENSIVE METABOLIC PANEL
ALT: 8 U/L (ref 0–53)
AST: 14 U/L (ref 0–37)
Albumin: 2.7 g/dL — ABNORMAL LOW (ref 3.5–5.2)
Calcium: 8.9 mg/dL (ref 8.4–10.5)
Sodium: 129 mEq/L — ABNORMAL LOW (ref 135–145)
Total Protein: 6.5 g/dL (ref 6.0–8.3)

## 2012-08-29 MED ORDER — LIDOCAINE 5 % EX PTCH
1.0000 | MEDICATED_PATCH | CUTANEOUS | Status: DC
  Administered 2012-08-29 – 2012-09-02 (×5): 1 via TRANSDERMAL
  Filled 2012-08-29 (×11): qty 1

## 2012-08-29 MED ORDER — CLINIMIX E/DEXTROSE (5/20) 5 % IV SOLN
INTRAVENOUS | Status: AC
  Administered 2012-08-29: 18:00:00 via INTRAVENOUS
  Filled 2012-08-29: qty 2000

## 2012-08-29 MED ORDER — VITAL 1.0 CAL PO LIQD
237.0000 mL | Freq: Two times a day (BID) | ORAL | Status: DC
  Administered 2012-08-30 – 2012-09-05 (×9): 237 mL via ORAL
  Filled 2012-08-29 (×23): qty 237

## 2012-08-29 NOTE — Progress Notes (Signed)
CARDIAC REHAB PHASE I   PRE:  Rate/Rhythm: 71 Sr    BP: sitting 90/50    SaO2: 97 RA  MODE:  Ambulation: 840 ft   POST:  Rate/Rhythm: 83     BP: sitting 130/60     SaO2: 100 RA  Tolerated well. No major c/o. Slight SOB after walk. Return to recliner.  [id number]  Harriet Masson CES, ACSM

## 2012-08-29 NOTE — Progress Notes (Signed)
[facility]  DAILY PROGRESS NOTE   Subjective:  Feels well, walking hall without walker or support. Denies dyspnea. A few 3-5 beat runs of VT noted, no further sustained VT. Chest tube drainage significantly reduced.  Objective:  Temp:  [97.5 F (36.4 C)-98.2 F (36.8 C)] 98.2 F (36.8 C) (10/08 0527) Pulse Rate:  [67-79] 73  (10/08 0527) Resp:  [16-18] 18  (10/08 0527) BP: (90-98)/(50-73) 95/58 mmHg (10/08 0527) SpO2:  [96 %-100 %] 100 % (10/08 0527) Weight change:   Intake/Output from previous day: 10/07 0701 - 10/08 0700 In: 0  Out: 1000 [Urine:900; Chest Tube:100]  Intake/Output from this shift:    Medications: Current Facility-Administered Medications  Medication Dose Route Frequency Provider Last Rate Last Dose  . 0.9 %  sodium chloride infusion   Intravenous Continuous Kalman Shan, MD 20 mL/hr at 08/24/12 0600    . acetaminophen (TYLENOL) tablet 650 mg  650 mg Oral Q4H PRN Lennette Bihari, MD   650 mg at 08/21/12 0752  . ALPRAZolam Prudy Feeler) tablet 0.25 mg  0.25 mg Oral TID PRN Abelino Derrick, PA      . amiodarone (PACERONE) tablet 400 mg  400 mg Oral BID Chrystie Nose, MD   400 mg at 08/28/12 2146  . aspirin EC tablet 81 mg  81 mg Oral Daily Lennette Bihari, MD   81 mg at 08/28/12 0953  . diphenhydrAMINE (BENADRYL) capsule 25 mg  25 mg Oral QHS PRN Storm Frisk, MD   25 mg at 08/25/12 0019  . fat emulsion 20 % infusion 250 mL  250 mL Intravenous Continuous TPN Lennon Alstrom, PHARMD 10 mL/hr at 08/28/12 1734 250 mL at 08/28/12 1734  . insulin aspart (novoLOG) injection 0-15 Units  0-15 Units Subcutaneous Q4H Crystal Malden, [place]   2 Units at 08/29/12 1610  . levalbuterol (XOPENEX) nebulizer solution 0.63 mg  0.63 mg Nebulization Q6H PRN Abelino Derrick, PA   0.63 mg at 08/15/12 1008  . lisinopril (PRINIVIL,ZESTRIL) tablet 2.5 mg  2.5 mg Oral Daily Nada Boozer, NP   2.5 mg at 08/28/12 0953  . magnesium oxide (MAG-OX)  tablet 400 mg  400 mg Per NG tube BID Thurmon Fair, MD   400 mg at 08/28/12 2145  . metoprolol tartrate (LOPRESSOR) tablet 12.5 mg  12.5 mg Oral TID Wilburt Finlay, PA   12.5 mg at 08/28/12 0954  . multivitamin with minerals tablet 1 tablet  1 tablet Oral Daily Kalman Shan, MD   1 tablet at 08/28/12 0954  . ondansetron (ZOFRAN) injection 4 mg  4 mg Intravenous Q4H PRN Chrystie Nose, MD   4 mg at 08/15/12 0207  . pantoprazole (PROTONIX) EC tablet 40 mg  40 mg Oral Q0600 Eda Paschal [place], [place]   40 mg at 08/29/12 0527  . potassium chloride (K-DUR,KLOR-CON) CR tablet 30 mEq  30 mEq Oral BID Marinus Maw, MD   30 mEq at 08/28/12 2145  . sodium chloride 0.9 % injection 10-40 mL  10-40 mL Intracatheter Q12H Arley Phenix, MD   20 mL at 08/27/12 2200  . sodium chloride 0.9 % injection 10-40 mL  10-40 mL Intracatheter PRN Arley Phenix, MD   10 mL at 08/29/12 0759  . Ticagrelor (BRILINTA) tablet 90 mg  90 mg Oral BID Lennette Bihari, MD   90 mg at 08/28/12 2145  . TPN (CLINIMIX) +/- additives   Intravenous Continuous TPN Crystal Stillinger  Merilynn Finland, PHARMD 83 mL/hr at 08/27/12 1712    . TPN (CLINIMIX) +/- additives   Intravenous Continuous TPN Lennon Alstrom, [place] 83 mL/hr at 08/28/12 1734    . TPN (CLINIMIX) +/- additives   Intravenous Continuous TPN Thuy Dien Dang, PHARMD      . traMADol Janean Sark) tablet 50 mg  50 mg Oral Q6H PRN Abelino Derrick, PA   50 mg at 08/27/12 1232  . zolpidem (AMBIEN) tablet 5 mg  5 mg Oral QHS PRN Abelino Derrick, PA      . DISCONTD: amiodarone (PACERONE) tablet 400 mg  400 mg Oral Daily Thurmon Fair, MD   400 mg at 08/28/12 1610    Physical Exam: General appearance: alert, cooperative, no distress and lying fully supine without dyspnea Neck: no adenopathy, no carotid bruit, no JVD, supple, symmetrical, trachea midline and thyroid not enlarged, symmetric, no tenderness/mass/nodules Lungs: chest tube site healthy Heart: regular rate and rhythm, S1, S2 normal, no  murmur, click, rub or gallop Abdomen: soft, non-tender; bowel sounds normal; no masses,  no organomegaly Extremities: extremities normal, atraumatic, no cyanosis or edema Pulses: 2+ and symmetric Skin: Skin color, texture, turgor normal. No rashes or lesions Neurologic: Grossly normal  Lab Results: Results for orders placed during the hospital encounter of 08/13/12 (from the past 48 hour(s))  GLUCOSE, CAPILLARY     Status: Abnormal   Collection Time   08/27/12  8:26 AM      Component Value Range Comment   Glucose-Capillary 141 (*) 70 - 99 mg/dL   GLUCOSE, CAPILLARY     Status: Abnormal   Collection Time   08/27/12 11:13 AM      Component Value Range Comment   Glucose-Capillary 171 (*) 70 - 99 mg/dL   GLUCOSE, CAPILLARY     Status: Abnormal   Collection Time   08/27/12  4:28 PM      Component Value Range Comment   Glucose-Capillary 126 (*) 70 - 99 mg/dL   GLUCOSE, CAPILLARY     Status: Abnormal   Collection Time   08/27/12  9:25 PM      Component Value Range Comment   Glucose-Capillary 147 (*) 70 - 99 mg/dL    Comment 1 Documented in Chart      Comment 2 Notify RN     GLUCOSE, CAPILLARY     Status: Abnormal   Collection Time   08/28/12 12:05 AM      Component Value Range Comment   Glucose-Capillary 117 (*) 70 - 99 mg/dL    Comment 1 Documented in Chart      Comment 2 Notify RN     MAGNESIUM     Status: Normal   Collection Time   08/28/12  4:00 AM      Component Value Range Comment   Magnesium 2.2  1.5 - 2.5 mg/dL   COMPREHENSIVE METABOLIC PANEL     Status: Abnormal   Collection Time   08/28/12  4:00 AM      Component Value Range Comment   Sodium 129 (*) 135 - 145 mEq/L    Potassium 4.1  3.5 - 5.1 mEq/L    Chloride 93 (*) 96 - 112 mEq/L    CO2 29  19 - 32 mEq/L    Glucose, Bld 129 (*) 70 - 99 mg/dL    BUN 15  6 - 23 mg/dL    Creatinine, Ser 9.60  0.50 - 1.35 mg/dL    Calcium 8.8  8.4 - 45.4  mg/dL    Total Protein 6.2  6.0 - 8.3 g/dL    Albumin 2.7 (*) 3.5 - 5.2 g/dL     AST 13  0 - 37 U/L    ALT 9  0 - 53 U/L    Alkaline Phosphatase 50  39 - 117 U/L    Total Bilirubin 0.6  0.3 - 1.2 mg/dL    GFR calc non Af Amer >90  >90 mL/min    GFR calc Af Amer >90  >90 mL/min   PHOSPHORUS     Status: Normal   Collection Time   08/28/12  4:00 AM      Component Value Range Comment   Phosphorus 3.3  2.3 - 4.6 mg/dL   CBC     Status: Abnormal   Collection Time   08/28/12  4:00 AM      Component Value Range Comment   WBC 12.4 (*) 4.0 - 10.5 K/uL    RBC 4.55  4.22 - 5.81 MIL/uL    Hemoglobin 14.0  13.0 - 17.0 g/dL    HCT 16.1  09.6 - 04.5 %    MCV 89.2  78.0 - 100.0 fL    MCH 30.8  26.0 - 34.0 pg    MCHC 34.5  30.0 - 36.0 g/dL    RDW 40.9  81.1 - 91.4 %    Platelets 387  150 - 400 K/uL   PREALBUMIN     Status: Normal   Collection Time   08/28/12  4:00 AM      Component Value Range Comment   Prealbumin 18.7  17.0 - 34.0 mg/dL   TRIGLYCERIDES     Status: Normal   Collection Time   08/28/12  4:00 AM      Component Value Range Comment   Triglycerides 94  <150 mg/dL   GLUCOSE, CAPILLARY     Status: Abnormal   Collection Time   08/28/12  5:30 AM      Component Value Range Comment   Glucose-Capillary 143 (*) 70 - 99 mg/dL    Comment 1 Documented in Chart      Comment 2 Notify RN     GLUCOSE, CAPILLARY     Status: Abnormal   Collection Time   08/28/12  8:56 AM      Component Value Range Comment   Glucose-Capillary 135 (*) 70 - 99 mg/dL   GLUCOSE, CAPILLARY     Status: Abnormal   Collection Time   08/28/12 10:49 AM      Component Value Range Comment   Glucose-Capillary 155 (*) 70 - 99 mg/dL   TRIGLYCERIDES, BODY FLUIDS     Status: Normal   Collection Time   08/28/12 11:07 AM      Component Value Range Comment   Triglycerides, Fluid 50      Fluid Type-FTRIG     CORRECTED ON 10/07 AT 1612: PREVIOUSLY REPORTED [place] LYMPH DRAINAGE TC [address] CORRECTED ON 10/07 AT 1127: PREVIOUSLY REPORTED AS Body Fluid   Value: CORRECTED ON 10/07 AT 1127: PREVIOUSLY REPORTED AS BODY  FLUID  TSH     Status: Abnormal   Collection Time   08/28/12 11:59 AM      Component Value Range Comment   TSH 11.427 (*) 0.350 - 4.500 uIU/mL   T4, FREE     Status: Normal   Collection Time   08/28/12 11:59 AM      Component Value Range Comment   Free T4 0.83  0.80 - 1.80 ng/dL  T3, FREE     Status: Abnormal   Collection Time   08/28/12 11:59 AM      Component Value Range Comment   T3, Free 2.2 (*) 2.3 - 4.2 pg/mL   GLUCOSE, CAPILLARY     Status: Abnormal   Collection Time   08/28/12  4:57 PM      Component Value Range Comment   Glucose-Capillary 129 (*) 70 - 99 mg/dL    Comment 1 Documented in Chart      Comment 2 Notify RN     GLUCOSE, CAPILLARY     Status: Abnormal   Collection Time   08/28/12  8:12 PM      Component Value Range Comment   Glucose-Capillary 140 (*) 70 - 99 mg/dL    Comment 1 Documented in Chart      Comment 2 Notify RN     GLUCOSE, CAPILLARY     Status: Abnormal   Collection Time   08/29/12 12:07 AM      Component Value Range Comment   Glucose-Capillary 122 (*) 70 - 99 mg/dL    Comment 1 Notify RN     CBC WITH DIFFERENTIAL     Status: Abnormal   Collection Time   08/29/12  5:14 AM      Component Value Range Comment   WBC 11.2 (*) 4.0 - 10.5 K/uL    RBC 4.58  4.22 - 5.81 MIL/uL    Hemoglobin 14.2  13.0 - 17.0 g/dL    HCT 65.7  84.6 - 96.2 %    MCV 89.7  78.0 - 100.0 fL    MCH 31.0  26.0 - 34.0 pg    MCHC 34.5  30.0 - 36.0 g/dL    RDW 95.2  84.1 - 32.4 %    Platelets 463 (*) 150 - 400 K/uL    Neutrophils Relative 68  43 - 77 %    Neutro Abs 7.6  1.7 - 7.7 K/uL    Lymphocytes Relative 12  12 - 46 %    Lymphs Abs 1.4  0.7 - 4.0 K/uL    Monocytes Relative 10  3 - 12 %    Monocytes Absolute 1.2 (*) 0.1 - 1.0 K/uL    Eosinophils Relative 9 (*) 0 - 5 %    Eosinophils Absolute 1.0 (*) 0.0 - 0.7 K/uL    Basophils Relative 1  0 - 1 %    Basophils Absolute 0.1  0.0 - 0.1 K/uL   COMPREHENSIVE METABOLIC PANEL     Status: Abnormal   Collection Time    08/29/12  5:14 AM      Component Value Range Comment   Sodium 129 (*) 135 - 145 mEq/L    Potassium 4.2  3.5 - 5.1 mEq/L    Chloride 92 (*) 96 - 112 mEq/L    CO2 29  19 - 32 mEq/L    Glucose, Bld 125 (*) 70 - 99 mg/dL    BUN 14  6 - 23 mg/dL    Creatinine, Ser 4.01  0.50 - 1.35 mg/dL    Calcium 8.9  8.4 - 02.7 mg/dL    Total Protein 6.5  6.0 - 8.3 g/dL    Albumin 2.7 (*) 3.5 - 5.2 g/dL    AST 14  0 - 37 U/L    ALT 8  0 - 53 U/L    Alkaline Phosphatase 58  39 - 117 U/L    Total Bilirubin 0.4  0.3 - 1.2 mg/dL  GFR calc non Af Amer >90  >90 mL/min    GFR calc Af Amer >90  >90 mL/min   GLUCOSE, CAPILLARY     Status: Abnormal   Collection Time   08/29/12  5:21 AM      Component Value Range Comment   Glucose-Capillary 125 (*) 70 - 99 mg/dL     Imaging: Dg Chest 2 View  08/28/2012  *RADIOLOGY REPORT*  Clinical Data: Evaluate for residual pneumothorax.  Chest tube on water seal.  CHEST - 2 VIEW  Comparison: CT of 08/24/2012.  Plain film of 08/22/2012.  Findings: Moderate thoracic spondylosis.  Left-sided PICC line tip in mid SVC.  Right-sided chest tube remains in place.  The right apical pneumothorax is similar to 08/22/2012, approximately 10%.  Mild tracheal deviation right. Normal heart size.  No pleural fluid.  Paramediastinal radiation changes.  Lungs otherwise clear.  IMPRESSION: Right-sided chest tube remaining in place with similar 10% right apical pneumothorax.  New left-sided PICC line, tip mid SVC.   Original Report Authenticated By: Consuello Bossier, M.D.     Assessment:  1. Principal Problem: 2.  *Sustained ventricular tachycardia with associated syncope; Monomorphic 3. Active Problems: 4.  Hx of cancer of lung, 3 years ago treated with radiation and chemo  5.  GERD (gastroesophageal reflux disease) 6.  Hyperglycemia, pt has been on meds for diabetes in the past 7.  Cardiomyopathy, ischemic, by cardiac cath 30-35% 08/13/12  8.  CAD (coronary artery disease) 3 vessel  disease 9.  S/P coronary artery stent placement, acutely to RCA for acute Post. MI with BMS, 08/13/12 10.  Pacemaker, temporary placed 08/13/12 11.  Hypoxemia 12.  Acute respiratory failure, extubated 08/19/12 13.  Pneumonia, organism unspecified 14.  Shock - on admission 08/13/12 15.  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 16.  Pleural effusion, intubated, chest tube place 08/16/12 17.  Hypokalemia 18.  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   Plan:  1. Continue monitoring for recurrent VT on escalated amiodarone dose. Will need LifeVest at discharge until malignancy status is clarified. 2. No evidence of CHF. Appears euvolemic versus mildly hypovolemic. BP precludes escalation of HF meds. 3. Will start modified fat diet; reevaluate CT drainage after he starts eating again. 4. Suspicion of trapped lung - may need repeat CT.  Time Spent Directly with Patient:  30 minutes  Length of Stay:  LOS: 16 days    Lanecia Sliva 08/29/2012, 8:16 AM

## 2012-08-29 NOTE — Progress Notes (Signed)
PARENTERAL NUTRITION CONSULT NOTE - FOLLOW UP  Pharmacy Consult: TPN Indication: Chylothorax  No Known Allergies  Patient Measurements: Height: 6' (182.9 cm) Weight: 217 lb 6 oz (98.6 kg) (standing weight scale) IBW/kg (Calculated) : 77.6  Adjusted Body Weight: 83.9 kg Usual Weight: 98.6 kg  Vital Signs: Temp: 98.2 F (36.8 C) (10/08 0527) Temp src: Oral (10/08 0527) BP: 95/58 mmHg (10/08 0527) Pulse Rate: 73  (10/08 0527) Intake/Output from previous day: 10/07 0701 - 10/08 0700 In: 0  Out: 1000 [Urine:900; Chest Tube:100]  Labs:  [facility] 08/29/12 0514 08/28/12 0400  WBC 11.2* 12.4*  HGB 14.2 14.0  HCT 41.1 40.6  PLT 463* 387  APTT -- --  INR -- --     Basename 08/29/12 0514 08/28/12 0400  NA 129* 129*  K 4.2 4.1  CL 92* 93*  CO2 29 29  GLUCOSE 125* 129*  BUN 14 15  CREATININE 0.64 0.62  LABCREA -- --  CREAT24HRUR -- --  CALCIUM 8.9 8.8  MG -- 2.2  PHOS -- 3.3  PROT 6.5 6.2  ALBUMIN 2.7* 2.7*  AST 14 13  ALT 8 9  ALKPHOS 58 50  BILITOT 0.4 0.6  BILIDIR -- --  IBILI -- --  PREALBUMIN -- 18.7  TRIG -- 94  CHOLHDL -- --  CHOL -- --   Estimated Creatinine Clearance: 128.4 ml/min (by C-G formula based on Cr of 0.64).    Basename 08/29/12 0521 08/29/12 0007 08/28/12 2012  GLUCAP 125* 122* 140*     Insulin Requirements in the past 24 hours:  11 units on moderate SSI TIDac   Current Nutrition:  TPN  Assessment:  58 YOM admitted 9/22 with syncope and sustained VT with STEMI, emergent cath/PCI, stent, temp. pacer. Developed respiratory distress with VDRF, found to have PNA and moderate right pleural effusion on chest CT.  Found to have 300 cc chylous pleural fluid out of chest tube in 12h on 08/24/12. Diet changed to NPO and orders for TNA per pharmacy.  Prealbumin still WNL but has decreased, likely due to stress of illness.  Noted NG output decreasing and fluid is serous looking.  Agree with plan to advance patient's diet, limiting LCT and  providing more MCT.  GI: intact, NPO/TPN to decrease chyle -- output decreased to yesterday, PO multivitamin, PO PPI  Endo: TSH elevated, T4 WNL, T3 slightly low.  no hx DM - CBGs well controlled Lytes: chronic hyponatremia - Na 129;  Cl also slightly low, on scheduled mag oxide 400 mg BID and  KCL 30 mEq BID Renal: SCr stable, CrCL ~128 ml/min, I/O's inaccurate, NS at [facility] Hepatobil: LFTs / TG WNL. Prealbumin down 18.7 but still WNL Pulm: stable on RA - on Xopenex Neuro: WNL  Cards: hx systolic CHF (EF 16%). STEMI s/p PCI on amiodarone, ASA 81mg , lisinopril, Magox, metoprolol, KCL, Brilinta -- BP/HR remains soft, HR ok - amio increased BID due to vtach 10/7 AM ID: s/p Ancef for MSSA in sputum 9/24; afebrile, WBC trending down again Heme/Onc: hx small celll lung cancer; no clear signs of recurrent cancer per chest CT. Needs outpt PET scan - H/H WNL, thrombocytosis Best Practices:  TPN access: central line triple lumen PO PPI VTE px: SCDs   Nutritional Goals:  2100-2300 kcal and 100-120 protein   Plan:  - Continue Clinimix E 5/20 at 83 ml/hr, providing 93% of minimal kCal and 100% of minimal protein needs - Provide lipids at 10 ml/hr MWF only due to national  shortage  - Continue PO multivitamin daily (no MVI / trace elements in TPN) - F/U diet advancement to start weaning TPN     Gladstone Rosas D. Laney Potash, PharmD, BCPS Pager:  [phone] 08/29/2012, 7:09 AM

## 2012-08-29 NOTE — Progress Notes (Addendum)
16 Days Post-Op Procedure(s) (LRB): LEFT HEART CATHETERIZATION WITH CORONARY ANGIOGRAM (N/A) TEMPORARY PACEMAKER INSERTION (Right) PERCUTANEOUS CORONARY STENT INTERVENTION (PCI-S) (Right) Subjective:  Mr. Fiveash complains of some soreness on his right side, especially when he tries to take a deep cough.  Objective: Vital signs in last 24 hours: Temp:  [97.5 F (36.4 C)-98.2 F (36.8 C)] 98.2 F (36.8 C) (10/08 0527) Pulse Rate:  [67-79] 73  (10/08 0527) Cardiac Rhythm:  [-] Normal sinus rhythm (10/08 0755) Resp:  [16-18] 18  (10/08 0527) BP: (90-98)/(50-73) 95/58 mmHg (10/08 0527) SpO2:  [96 %-100 %] 100 % (10/08 0527)    Intake/Output from previous day: 10/07 0701 - 10/08 0700 In: 0  Out: 1000 [Urine:900; Chest Tube:100]  General appearance: alert, cooperative and no distress Heart: regular rate and rhythm Lungs: rhonchi on expiration Abdomen: soft, non-tender; bowel sounds normal; no masses,  no organomegaly Wound: clean and dry  Lab Results:  Basename 08/29/12 0514 08/28/12 0400  WBC 11.2* 12.4*  HGB 14.2 14.0  HCT 41.1 40.6  PLT 463* 387   BMET:  Basename 08/29/12 0514 08/28/12 0400  NA 129* 129*  K 4.2 4.1  CL 92* 93*  CO2 29 29  GLUCOSE 125* 129*  BUN 14 15  CREATININE 0.64 0.62  CALCIUM 8.9 8.8    PT/INR: No results found for this basename: LABPROT,INR in the last 72 hours ABG    Component Value Date/Time   PHART 7.498* 08/17/2012 0447   HCO3 28.1* 08/17/2012 0447   TCO2 29 08/17/2012 0447   O2SAT 99.0 08/17/2012 0447   CBG (last 3)   Basename 08/29/12 0521 08/29/12 0007 08/28/12 2012  GLUCAP 125* 122* 140*    Assessment/Plan: S/P Procedure(s) (LRB): LEFT HEART CATHETERIZATION WITH CORONARY ANGIOGRAM (N/A) TEMPORARY PACEMAKER INSERTION (Right) PERCUTANEOUS CORONARY STENT INTERVENTION (PCI-S) (Right)  1. Chest tube in place- drainage has improved, no longer milky output on water seal 2. Primary team plans to advance diet today.  Would  benefit to leave tube in place to ensure diet does not affect current drainage   LOS: 16 days    BARRETT, ERIN 08/29/2012    Chart reviewed, patient examined, agree with above. He has only had a decrease in chest tube output for a couple days. I would not restart a po diet yet. This will likely result in increased chest tube output that is milky. Chyle leaks do not seal very quickly. Fat modified diet ordered by cardiology.

## 2012-08-29 NOTE — Progress Notes (Signed)
Subjective: Pt stable overnight with no new complaints.  No increased wob.  Chest tube still draining. For 24 h 100cc  Objective:  Intake/Output from previous day: 10/07 0701 - 10/08 0700 In: 0  Out: 1000 [Urine:900; Chest Tube:100]   Physical Exam: Vital signs in last 24 hours:  BP 95/58  Pulse 73  Temp 98.2 F (36.8 C) (Oral)  Resp 18  Ht 6' (1.829 m)  Wt 217 lb 6 oz (98.6 kg)  BMI 29.48 kg/m2  SpO2 100%   wd male in nad Nose without purulence or discharge noted. Chest with crackles on right. Cor with rrr abd benign LE without significant edema, no cyanosis Alert and oriented, moves all 4.   Lab Results:  Basename 08/29/12 0514 08/28/12 0400  WBC 11.2* 12.4*  HGB 14.2 14.0  HCT 41.1 40.6  PLT 463* 387   BMET  Basename 08/29/12 0514 08/28/12 0400  NA 129* 129*  K 4.2 4.1  CL 92* 93*  CO2 29 29  GLUCOSE 125* 129*  BUN 14 15  CREATININE 0.64 0.62  CALCIUM 8.9 8.8    Studies/Results: Dg Chest 2 View  08/28/2012  *RADIOLOGY REPORT*  Clinical Data: Evaluate for residual pneumothorax.  Chest tube on water seal.  CHEST - 2 VIEW  Comparison: CT of 08/24/2012.  Plain film of 08/22/2012.  Findings: Moderate thoracic spondylosis.  Left-sided PICC line tip in mid SVC.  Right-sided chest tube remains in place.  The right apical pneumothorax is similar to 08/22/2012, approximately 10%.  Mild tracheal deviation right. Normal heart size.  No pleural fluid.  Paramediastinal radiation changes.  Lungs otherwise clear.  IMPRESSION: Right-sided chest tube remaining in place with similar 10% right apical pneumothorax.  New left-sided PICC line, tip mid SVC.   Original Report Authenticated By: Consuello Bossier, M.D.      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 We will initiate fat modified diet with medium chain triglycerides per nutrition recommendation and  follow up on chest tube drainage and pleural fluid triglycerides in 2-3 days after diet initiation; Appreciate the help; with diet change we will monitor fluid characteristics; if no change in effusion would d/c chest tube; if rea cumulation/recurrent chylothorax after initiating diet would recommend talc pleurodesis before proceeding to chylous duct ligation/embolization. CT surgery following.  Will continue with conservative therapy, and hope this resolves.    Now fluid serous looking, no macroscopic evidence of chylous drainage; Triglycerides 50; draining anywhere from 70 to 300 cc/day.    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 He refused to have a PET now or in the future. He would like to follow up in regards to his cancer with his primary oncologist.   CXR with small residual pneumothorax on water seal.Suspect a component of trapped lung. I would recommend repeating CT chest to evaluate for pleural thickening now that chest tube still in place. Especially since the patient refuses to have PET scan now or in the near future.   Vanetta Mulders [phone]

## 2012-08-30 DIAGNOSIS — I898 Other specified noninfective disorders of lymphatic vessels and lymph nodes: Secondary | ICD-10-CM

## 2012-08-30 LAB — COMPREHENSIVE METABOLIC PANEL
ALT: 11 U/L (ref 0–53)
CO2: 26 mEq/L (ref 19–32)
Calcium: 8.5 mg/dL (ref 8.4–10.5)
Creatinine, Ser: 0.6 mg/dL (ref 0.50–1.35)
GFR calc Af Amer: >90 mL/min (ref >90)
GFR calc non Af Amer: >90 mL/min (ref >90)
Glucose, Bld: 132 mg/dL — ABNORMAL HIGH (ref 70–99)
Sodium: 128 mEq/L — ABNORMAL LOW (ref 135–145)
Total Protein: 6 g/dL (ref 6.0–8.3)

## 2012-08-30 LAB — CBC WITH DIFFERENTIAL/PLATELET
Basophils Absolute: 0.1 10*3/uL (ref 0.0–0.1)
Eosinophils Absolute: 1 10*3/uL — ABNORMAL HIGH (ref 0.0–0.7)
Eosinophils Relative: 10 % — ABNORMAL HIGH (ref 0–5)
HCT: 39.9 % (ref 39.0–52.0)
Lymphs Abs: 1.1 10*3/uL (ref 0.7–4.0)
MCH: 30.4 pg (ref 26.0–34.0)
MCV: 87.9 fL (ref 78.0–100.0)
Monocytes Absolute: 1 10*3/uL (ref 0.1–1.0)
Platelets: 415 10*3/uL — ABNORMAL HIGH (ref 150–400)
RDW: 13.3 % (ref 11.5–15.5)

## 2012-08-30 LAB — GLUCOSE, CAPILLARY
Glucose-Capillary: 126 mg/dL — ABNORMAL HIGH (ref 70–99)
Glucose-Capillary: 143 mg/dL — ABNORMAL HIGH (ref 70–99)

## 2012-08-30 MED ORDER — FAT EMULSION 20 % IV EMUL
250.0000 mL | INTRAVENOUS | Status: AC
  Administered 2012-08-30: 250 mL via INTRAVENOUS
  Filled 2012-08-30: qty 250

## 2012-08-30 MED ORDER — CLINIMIX E/DEXTROSE (5/20) 5 % IV SOLN
INTRAVENOUS | Status: AC
  Administered 2012-08-30: 19:00:00 via INTRAVENOUS
  Filled 2012-08-30: qty 2000

## 2012-08-30 NOTE — Progress Notes (Signed)
PARENTERAL NUTRITION CONSULT NOTE - FOLLOW UP  Pharmacy Consult: TPN Indication: Chylothorax  No Known Allergies  Patient Measurements: Height: 6' (182.9 cm) Weight: 215 lb 11.2 oz (97.841 kg) IBW/kg (Calculated) : 77.6  Adjusted Body Weight: 83.9 kg Usual Weight: 98.6 kg  Vital Signs: Temp: 98.1 F (36.7 C) (10/09 0526) Temp src: Oral (10/09 0526) BP: 96/53 mmHg (10/09 0526) Pulse Rate: 71  (10/09 0526) Intake/Output from previous day: 10/08 0701 - 10/09 0700 In: 600 [P.O.:600] Out: 1480 [Urine:1370; Chest Tube:110]  Labs:  Basename 08/30/12 0500 08/29/12 0514 08/28/12 0400  WBC 9.4 11.2* 12.4*  HGB 13.8 14.2 14.0  HCT 39.9 41.1 40.6  PLT 415* 463* 387  APTT -- -- --  INR -- -- --     Basename 08/30/12 0500 08/29/12 0514 08/28/12 0400  NA 128* 129* 129*  K 3.9 4.2 4.1  CL 90* 92* 93*  CO2 26 29 29   GLUCOSE 132* 125* 129*  BUN 14 14 15   CREATININE 0.60 0.64 0.62  LABCREA -- -- --  CREAT24HRUR -- -- --  CALCIUM 8.5 8.9 8.8  MG -- -- 2.2  PHOS -- -- 3.3  PROT 6.0 6.5 6.2  ALBUMIN 2.5* 2.7* 2.7*  AST 18 14 13   ALT 11 8 9   ALKPHOS 61 58 50  BILITOT 0.5 0.4 0.6  BILIDIR -- -- --  IBILI -- -- --  PREALBUMIN -- -- 18.7  TRIG -- -- 94  CHOLHDL -- -- --  CHOL -- -- --   Estimated Creatinine Clearance: 128 ml/min (by C-G formula based on Cr of 0.6).    Basename 08/30/12 0405 08/30/12 0020 08/29/12 2020  GLUCAP 143* 126* 140*     Insulin Requirements in the past 24 hours:  12 units on moderate SSI TIDac   Current Nutrition:  TPN  Assessment:  24 YOM admitted 9/22 with syncope and sustained VT with STEMI, emergent cath/PCI, stent, temp. pacer. Developed respiratory distress with VDRF, found to have PNA and moderate right pleural effusion on chest CT.  Found to have 300 cc chylous pleural fluid out of chest tube in 12h on 08/24/12. Diet changed to NPO and orders for TNA per pharmacy.  Prealbumin still WNL but has decreased, likely due to stress of  illness.  Noted output decreasing and fluid is serous looking.  Agree with plan to advance patient's diet, limiting LCT and providing more MCT to prevent increased chyle drainage.   GI: intact, NPO/TPN to decrease chyle, CT O/P significantly reduced, PO multivitamin, PO PPI  Endo: TSH elevated, T4 WNL, T3 slightly low.  no hx DM - CBGs well controlled Lytes: chronic hyponatremia - Na 128;  Cl also slightly low, on scheduled mag oxide 400 mg BID and  KCL 30 mEq BID Renal: SCr stable, CrCL ~128 ml/min, I/O's inaccurate, NS at [facility] Hepatobil: LFTs / TG WNL. Prealbumin down 18.7 but still WNL Pulm: stable on RA - on Xopenex Neuro: WNL  Cards: hx systolic CHF (EF 09%). STEMI s/p PCI on amiodarone, ASA 81mg , lisinopril, Magox, metoprolol, KCL, Brilinta -- BP remains soft, HR ok - amio increased BID due to vtach 10/7 AM ID: s/p Ancef for MSSA in sputum 9/24; afebrile, WBC trending down again Heme/Onc: hx small celll lung cancer; no clear signs of recurrent cancer per chest CT. Pt refused PET scan - H/H WNL, thrombocytosis Best Practices:  TPN access: PICC PO PPI VTE px: SCDs   Nutritional Goals:  2100-2300 kcal and 100-120 protein   Plan:  -  Continue Clinimix E 5/20 at 83 ml/hr, providing 93% of minimal kCal and 100% of minimal protein needs - Provide lipids at 10 ml/hr MWF only due to national shortage  - Continue PO multivitamin daily (no MVI / trace elements in TPN) - F/U diet initiation to start weaning TPN    Darcy Cordner D. Laney Potash, PharmD, BCPS Pager:  [phone] 08/30/2012, 8:10 AM

## 2012-08-30 NOTE — Progress Notes (Signed)
Subjective: No complaints, ambulates with mild sob Diet still held, see Dr. Sharee Pimple note  Objective: Vital signs in last 24 hours: Temp:  [97.5 F (36.4 C)-98.1 F (36.7 C)] 98.1 F (36.7 C) (10/09 0526) Pulse Rate:  [67-71] 71  (10/09 0526) Resp:  [20] 20  (10/09 0526) BP: (94-112)/(50-55) 96/53 mmHg (10/09 0526) SpO2:  [96 %-100 %] 96 % (10/09 0526) Weight:  [97.841 kg (215 lb 11.2 oz)] 97.841 kg (215 lb 11.2 oz) (10/09 0526) Weight change:  Last BM Date: 08/28/12 Intake/Output from previous day: -880 10/08 0701 - 10/09 0700 In: 600 [P.O.:600] Out: 1480 [Urine:1370; Chest Tube:110] Intake/Output this shift:    PE: General: alert and oriented Heart:S1S2 RRR Lungs:+ Rhonchi ? Pl rub--chest tube drainage serrous Abd:+ BS, soft, non tender Ext:no edema    Lab Results:  Basename 08/30/12 0500 08/29/12 0514  WBC 9.4 11.2*  HGB 13.8 14.2  HCT 39.9 41.1  PLT 415* 463*   BMET  Basename 08/30/12 0500 08/29/12 0514  NA 128* 129*  K 3.9 4.2  CL 90* 92*  CO2 26 29  GLUCOSE 132* 125*  BUN 14 14  CREATININE 0.60 0.64  CALCIUM 8.5 8.9   No results found for this basename: TROPONINI:2,CK,MB:2 in the last 72 hours  Lab Results  Component Value Date   CHOL 156 08/22/2012   HDL 35* 08/16/2012   LDLCALC 74 08/16/2012   TRIG 94 08/28/2012   CHOLHDL 3.7 08/16/2012   Lab Results  Component Value Date   HGBA1C 5.9* 08/13/2012     Lab Results  Component Value Date   TSH 11.427* 08/28/2012    Hepatic Function Panel  Basename 08/30/12 0500  PROT 6.0  ALBUMIN 2.5*  AST 18  ALT 11  ALKPHOS 61  BILITOT 0.5  BILIDIR --  IBILI --     EKG: Orders placed during the hospital encounter of 08/13/12  . EKG 12-LEAD  . EKG 12-LEAD  . EKG 12-LEAD  . EKG 12-LEAD  . EKG 12-LEAD  . EKG 12-LEAD  . EKG 12-LEAD  . EKG 12-LEAD  . EKG 12-LEAD  . EKG 12-LEAD  . EKG  . EKG 12-LEAD  . EKG 12-LEAD  . EKG 12-LEAD  . EKG 12-LEAD    Studies/Results: Ct Chest Wo  Contrast  08/29/2012  *RADIOLOGY REPORT*  Clinical Data: Lung carcinoma.  Right-sided chest pain.  Postop right pneumothorax  CT CHEST WITHOUT CONTRAST  Technique:  Multidetector CT imaging of the chest was performed following the standard protocol without IV contrast.  Comparison: 08/24/2012  Findings: A small less than 10% right pneumothorax shows no significant change in size, and right chest tube remains in place. Paramediastinal radiation changes involving both upper lobes, right side greater than left, are stable in appearance.  There is no evidence of acute infiltrate, and no suspicious pulmonary nodules or masses are identified.  No evidence of pleural effusion.  1.2 cm left superior mediastinal lymph node in the prevascular space remains stable.  No other pathologically enlarged nodes are identified within the thorax.  IMPRESSION:  1.  Stable small less than 10% right pneumothorax with right chest tube in place. 2.  Stable mild left superior mediastinal lymphadenopathy. 3.  Stable upper lobe radiation changes.   Original Report Authenticated By: Danae Orleans, M.D.     Medications: I have reviewed the patient's current medications.    Marland Kitchen amiodarone  400 mg Oral BID  . aspirin EC  81 mg Oral Daily  . feeding  supplement (VITAL 1.0 CAL)  237 mL Oral BID BM  . insulin aspart  0-15 Units Subcutaneous Q4H  . lidocaine  1 patch Transdermal Q24H  . lisinopril  2.5 mg Oral Daily  . magnesium oxide  400 mg Per NG tube BID  . metoprolol tartrate  12.5 mg Oral TID  . multivitamin with minerals  1 tablet Oral Daily  . pantoprazole  40 mg Oral Q0600  . potassium chloride  30 mEq Oral BID  . sodium chloride  10-40 mL Intracatheter Q12H  . Ticagrelor  90 mg Oral BID   Assessment/Plan: Principal Problem:  *Sustained ventricular tachycardia with associated syncope; Monomorphic Active Problems:  Hx of cancer of lung, 3 years ago treated with radiation and chemo   GERD (gastroesophageal reflux  disease)  Hyperglycemia, pt has been on meds for diabetes in the past  Cardiomyopathy, ischemic, by cardiac cath 30-35% 08/13/12   CAD (coronary artery disease) 3 vessel disease  S/P coronary artery stent placement, acutely to RCA for acute Post. 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 Does not plan to have a PET scan.  Will need to send to his oncologist to have an opinion about recurrence of cancer.  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  If recurrent malignancy then no ICD.  Will this affect his need for Life Vest?  Or could he wear life vest long term?  EF 25-30%.     CT of chest repeated yesterday.  Continued hyponatremia  TPN continues. VS borderline, no dizziness with ambulation yesterday.  LOS: 17 days   [name] [name],[name] 08/30/2012, 11:54 AM  I have seen & examined the patient this PM after being seen by Ms. [name] [name].  I agree with her findings, exam & impression.  No further NSVT (last short run was 10/8 early AM) after increased Amio dosing.    Would continue @ current Amiodarone dose for total of 1 week since re-increased.  With his episodes of NSVT & severely decreased EF -- I do feel that Life-Vest is warranted while we await further evaluation of possible recurrent malignancy.  Would order for ~78months to allow time for w/u & medical optimization of CHF/CAD meds.  Currently unable to titrate medications due to low BP. -- on low dose BB & ACE-I  Chylothorax / Chest tube management per PCCM & CT Sgx.  Resuming diet per Dr. Laneta Simmers (unfortunately given full diet & chylothorax worsened)  This remains his main medical issue -- unfortunately, he  is unwilling to undergo any further evaluation while here.    ?? How this will affect decisions re ? Pleurodesis if further thoracic Sgx may be upcoming  PCI was with a BMS, would hope for a minimum of 65month of DAPT (if Sgx needed prior to 1 month post PCI/MI, would need to consider Integrilin bridging).   Continue ambulation with PT/OT - Cardiac rehab. Sodium level remains a bit low, but ~stable. No active SSx of CHF -- not on diuretic.  Marykay Lex, M.D., M.S. [facility] [address] [place], [place]  [address]  [phone] Pager # [phone] 08/30/2012 3:14 PM

## 2012-08-30 NOTE — Progress Notes (Signed)
Subjective: Pt stable overnight with no new complaints.  No increased wob.  Chest tube still draining. For 24 h 100cc  Objective:  Intake/Output from previous day: 10/08 0701 - 10/09 0700 In: 600 [P.O.:600] Out: 1480 [Urine:1370; Chest Tube:110]   Physical Exam: Vital signs in last 24 hours:  BP 96/53  Pulse 71  Temp 98.1 F (36.7 C) (Oral)  Resp 20  Ht 6' (1.829 m)  Wt 97.841 kg (215 lb 11.2 oz)  BMI 29.25 kg/m2  SpO2 96%   wd male in nad, sleeping Nose without purulence or discharge noted. Chest with crackles on right. Rt chest tube with cloudy amber drainage. 110 cc over 24 hours. Cor with rrr abd benign LE without significant edema, no cyanosis Alert and oriented, moves all 4.   Lab Results:  Basename 08/30/12 0500 08/29/12 0514 08/28/12 0400  WBC 9.4 11.2* 12.4*  HGB 13.8 14.2 14.0  HCT 39.9 41.1 40.6  PLT 415* 463* 387   BMET  Basename 08/30/12 0500 08/29/12 0514 08/28/12 0400  NA 128* 129* 129*  K 3.9 4.2 4.1  CL 90* 92* 93*  CO2 26 29 29   GLUCOSE 132* 125* 129*  BUN 14 14 15   CREATININE 0.60 0.64 0.62  CALCIUM 8.5 8.9 8.8    Studies/Results: Dg Chest 2 View  08/28/2012  *RADIOLOGY REPORT*  Clinical Data: Evaluate for residual pneumothorax.  Chest tube on water seal.  CHEST - 2 VIEW  Comparison: CT of 08/24/2012.  Plain film of 08/22/2012.  Findings: Moderate thoracic spondylosis.  Left-sided PICC line tip in mid SVC.  Right-sided chest tube remains in place.  The right apical pneumothorax is similar to 08/22/2012, approximately 10%.  Mild tracheal deviation right. Normal heart size.  No pleural fluid.  Paramediastinal radiation changes.  Lungs otherwise clear.  IMPRESSION: Right-sided chest tube remaining in place with similar 10% right apical pneumothorax.  New left-sided PICC line, tip mid SVC.   Original Report Authenticated By: Consuello Bossier, M.D.    Ct Chest Wo Contrast  08/29/2012  *RADIOLOGY REPORT*  Clinical Data: Lung carcinoma.  Right-sided  chest pain.  Postop right pneumothorax  CT CHEST WITHOUT CONTRAST  Technique:  Multidetector CT imaging of the chest was performed following the standard protocol without IV contrast.  Comparison: 08/24/2012  Findings: A small less than 10% right pneumothorax shows no significant change in size, and right chest tube remains in place. Paramediastinal radiation changes involving both upper lobes, right side greater than left, are stable in appearance.  There is no evidence of acute infiltrate, and no suspicious pulmonary nodules or masses are identified.  No evidence of pleural effusion.  1.2 cm left superior mediastinal lymph node in the prevascular space remains stable.  No other pathologically enlarged nodes are identified within the thorax.  IMPRESSION:  1.  Stable small less than 10% right pneumothorax with right chest tube in place. 2.  Stable mild left superior mediastinal lymphadenopathy. 3.  Stable upper lobe radiation changes.   Original Report Authenticated By: Danae Orleans, M.D.    Principal Problem:  *Sustained ventricular tachycardia with associated syncope; Monomorphic Active Problems:  Hx of cancer of lung, 3 years ago treated with radiation and chemo   GERD (gastroesophageal reflux disease)  Hyperglycemia, pt has been on meds for diabetes in the past  Cardiomyopathy, ischemic, by cardiac cath 30-35% 08/13/12   CAD (coronary artery disease) 3 vessel disease  S/P coronary artery stent placement, acutely to RCA for acute Post. 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 We will  initiate fat modified diet with medium chain triglycerides per nutrition recommendation and follow up on chest tube drainage and pleural fluid triglycerides in 2-3 days after diet initiation; Appreciate the help; with diet change we will monitor fluid characteristics; if no change in effusion would d/c chest tube; if rea cumulation/recurrent chylothorax after initiating diet would recommend talc pleurodesis before proceeding to chylous duct ligation/embolization. CT surgery following.  Will continue with conservative therapy, and hope this resolves.    Now fluid serous looking, no macroscopic evidence of chylous drainage; Triglycerides 50; draining anywhere from 70 to 300 cc/day.  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 He refused to have a PET now or in the future. He would like to follow up in regards to his cancer with his primary oncologist.   Brett Canales Minor ACNP [facility] PCC[facility] Pager [phone] till 3 pm If no answer page [phone] 08/30/2012, 10:03 AM

## 2012-08-30 NOTE — Progress Notes (Signed)
CARDIAC REHAB PHASE I   PRE:  Rate/Rhythm: 68 SR    BP: sitting 90/50    SaO2:   MODE:  Ambulation: 840 ft   POST:  Rate/Rhythm: 80     BP: sitting 140/70     SaO2:   Tolerated well, no c/o. BP up with walking (low before). Did st he felt slightly dizzy walking. Will f/u, encouraged more walking.  [phone]  Harriet Masson CES, ACSM

## 2012-08-30 NOTE — Progress Notes (Signed)
I personally seen and examined [name] [name]. I agree with [name] [name]'s note.   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 Unfortunately the patient eat regular diet, so the diet might have contributed to the recurrent chylothorax. I talked to the nutritionist and to the nursing staff. We will closely watch the patient's diet and continue to monitor the pleural drainage.   Vanetta Mulders, MD  [phone]

## 2012-08-30 NOTE — Plan of Care (Addendum)
Problem: Food- and Nutrition-Related Knowledge Deficit (NB-1.1) Goal: Nutrition education Formal process to instruct or train a patient/client in a skill or to impart knowledge to help patients/clients voluntarily manage or modify food choices and eating behavior to maintain or improve health.  Outcome: Completed/Met Date Met:  08/30/12  RD consult per MD for low fat diet education.  Lipid Panel     Component Value Date/Time    CHOL 156 08/22/2012 0840    TRIG 94 08/28/2012 0400    HDL 35* 08/16/2012 0410    CHOLHDL 3.7 08/16/2012 0410    VLDL 21 08/16/2012 0410    LDLCALC 74 08/16/2012 0410    Provided handouts from [employer] "Fat-Restricted Nutrition Therapy". Reviewed diet guidelines and recommendations. RD available for follow-up should patient or wife have further questions and/or concerns. Thank You.  RD spoke with Dr. Frederico Hamman regarding diet, plan is to change to Clear Liquids and allow other appropriate food items. RN aware.  Kirkland Hun, RD, LDN Pager #: [phone] After-Hours Pager #: [phone]

## 2012-08-30 NOTE — Progress Notes (Addendum)
17 Days Post-Op Procedure(s) (LRB): LEFT HEART CATHETERIZATION WITH CORONARY ANGIOGRAM (N/A) TEMPORARY PACEMAKER INSERTION (Right) PERCUTANEOUS CORONARY STENT INTERVENTION (PCI-S) (Right) Subjective:  Patient without complaints this morning.   Objective: Vital signs in last 24 hours: Temp:  [97.3 F (36.3 C)-98.1 F (36.7 C)] 98.1 F (36.7 C) (10/09 0526) Pulse Rate:  [67-83] 71  (10/09 0526) Cardiac Rhythm:  [-] Normal sinus rhythm (10/08 2010) Resp:  [20] 20  (10/09 0526) BP: (94-112)/(50-67) 96/53 mmHg (10/09 0526) SpO2:  [96 %-100 %] 96 % (10/09 0526) Weight:  [215 lb 11.2 oz (97.841 kg)] 215 lb 11.2 oz (97.841 kg) (10/09 0526)  Intake/Output from previous day: 10/08 0701 - 10/09 0700 In: 600 [P.O.:600] Out: 1480 [Urine:1370; Chest Tube:110]  General appearance: alert, cooperative and no distress Heart: regular rate and rhythm Lungs: rhonchi bilaterally and on expiration Abdomen: soft, non-tender; bowel sounds normal; no masses,  no organomegaly Wound: clean and dry  Lab Results:  Basename 08/30/12 0500 08/29/12 0514  WBC 9.4 11.2*  HGB 13.8 14.2  HCT 39.9 41.1  PLT 415* 463*   BMET:  Basename 08/29/12 0514 08/28/12 0400  NA 129* 129*  K 4.2 4.1  CL 92* 93*  CO2 29 29  GLUCOSE 125* 129*  BUN 14 15  CREATININE 0.64 0.62  CALCIUM 8.9 8.8    PT/INR: No results found for this basename: LABPROT,INR in the last 72 hours ABG    Component Value Date/Time   PHART 7.498* 08/17/2012 0447   HCO3 28.1* 08/17/2012 0447   TCO2 29 08/17/2012 0447   O2SAT 99.0 08/17/2012 0447   CBG (last 3)   Basename 08/30/12 0405 08/30/12 0020 08/29/12 2020  GLUCAP 143* 126* 140*    Assessment/Plan: S/P Procedure(s) (LRB): LEFT HEART CATHETERIZATION WITH CORONARY ANGIOGRAM (N/A) TEMPORARY PACEMAKER INSERTION (Right) PERCUTANEOUS CORONARY STENT INTERVENTION (PCI-S) (Right)  1. Chest tube in place- remains on water seal, output decreased, leave chest tube in place 2. NPO-  patient on TPN, continue for now 3. Dispo- continue NPO status, will advance diet once appropriate, management per primary team    LOS: 17 days    BARRETT, ERIN 08/30/2012   Chest tube output has decreased to 100cc and110cc for the past two days. He was given breakfast yesterday that sounds like it was regular diet. The fluid does look a little cloudier than yesterday am and triglyceride level in the fluid today was 50. I think it is probably ok to resume a lowfat diet and follow the amount of drainage and the triglyceride level. If the drainage increases then the next step will be talc pleurodesis.

## 2012-08-31 DIAGNOSIS — I472 Ventricular tachycardia, unspecified: Secondary | ICD-10-CM

## 2012-08-31 LAB — TRIGLYCERIDES, BODY FLUIDS: Triglycerides, Fluid: 64 mg/dL

## 2012-08-31 LAB — CBC WITH DIFFERENTIAL/PLATELET
Basophils Relative: 1 % (ref 0–1)
Eosinophils Absolute: 1.2 10*3/uL — ABNORMAL HIGH (ref 0.0–0.7)
HCT: 39.2 % (ref 39.0–52.0)
Hemoglobin: 13.6 g/dL (ref 13.0–17.0)
MCH: 30.6 pg (ref 26.0–34.0)
MCHC: 34.7 g/dL (ref 30.0–36.0)
Monocytes Absolute: 1.1 10*3/uL — ABNORMAL HIGH (ref 0.1–1.0)
Monocytes Relative: 9 % (ref 3–12)
Neutro Abs: 8.1 10*3/uL — ABNORMAL HIGH (ref 1.7–7.7)

## 2012-08-31 LAB — COMPREHENSIVE METABOLIC PANEL
Albumin: 2.5 g/dL — ABNORMAL LOW (ref 3.5–5.2)
BUN: 13 mg/dL (ref 6–23)
Chloride: 91 mEq/L — ABNORMAL LOW (ref 96–112)
Creatinine, Ser: 0.69 mg/dL (ref 0.50–1.35)
GFR calc Af Amer: >90 mL/min (ref >90)
Total Bilirubin: 0.3 mg/dL (ref 0.3–1.2)

## 2012-08-31 LAB — PHOSPHORUS: Phosphorus: 3.3 mg/dL (ref 2.3–4.6)

## 2012-08-31 LAB — MAGNESIUM: Magnesium: 2.2 mg/dL (ref 1.5–2.5)

## 2012-08-31 LAB — GLUCOSE, CAPILLARY: Glucose-Capillary: 147 mg/dL — ABNORMAL HIGH (ref 70–99)

## 2012-08-31 MED ORDER — CLINIMIX E/DEXTROSE (5/20) 5 % IV SOLN
INTRAVENOUS | Status: AC
  Administered 2012-08-31: 18:00:00 via INTRAVENOUS
  Filled 2012-08-31: qty 2000

## 2012-08-31 NOTE — Progress Notes (Signed)
Patient ID: [name] [name], male   DOB: 05/07/1956, 56 y.o.   MRN: [id number] Subjective:  No chest pain or sob. Note chest tube drainage.  Objective:  Vital Signs in the last 24 hours: Temp:  [97.7 F (36.5 C)-98.3 F (36.8 C)] 98.3 F (36.8 C) (10/10 0531) Pulse Rate:  [65-74] 74  (10/10 1101) Resp:  [18] 18  (10/09 2017) BP: (95-100)/(53-62) 100/60 mmHg (10/10 1101) SpO2:  [97 %-99 %] 97 % (10/10 0531) Weight:  [216 lb 11.4 oz (98.3 kg)] 216 lb 11.4 oz (98.3 kg) (10/10 0531)  Intake/Output from previous day: 10/09 0701 - 10/10 0700 In: 16535.8 [I.V.:3320; WUJ:81191.4] Out: 1635 [Urine:1075; Chest Tube:560] Intake/Output from this shift: Total I/O In: 240 [P.O.:240] Out: -   Physical Exam: Well appearing middle aged man, NAD HEENT: Unremarkable Neck:  No JVD, no thyromegally Lungs:  Clear with no wheezes HEART:  Regular rate rhythm, no murmurs, no rubs, no clicks Abd:  Flat, positive bowel sounds, no organomegally, no rebound, no guarding Ext:  2 plus pulses, no edema, no cyanosis, no clubbing Skin:  No rashes no nodules Neuro:  CN II through XII intact, motor grossly intact  Lab Results:  Basename 08/31/12 0600 08/30/12 0500  WBC 11.6* 9.4  HGB 13.6 13.8  PLT 428* 415*    Basename 08/31/12 0600 08/30/12 0500  NA 128* 128*  K 4.0 3.9  CL 91* 90*  CO2 29 26  GLUCOSE 136* 132*  BUN 13 14  CREATININE 0.69 0.60   No results found for this basename: TROPONINI:2,CK,MB:2 in the last 72 hours Hepatic Function Panel  Basename 08/31/12 0600  PROT 6.0  ALBUMIN 2.5*  AST 17  ALT 11  ALKPHOS 60  BILITOT 0.3  BILIDIR --  IBILI --   No results found for this basename: CHOL in the last 72 hours No results found for this basename: PROTIME in the last 72 hours  Imaging: No results found.  Cardiac Studies: Tele - nsr with no VT Assessment/Plan:  1. VT 2. ICM 3. Lung cancer, s/p chemo, xRT, ?recurrence 4. Chylous effusion Rec: Agree with plan to place life  vest prior to discharge until issue of whether he has recurrent CA can be sorted out. Would discharge on 400 mg daily of amiodarone. I will sign off. Please call for additional EP issues.  LOS: 18 days    Lewayne Bunting, M.D. 08/31/2012, 12:09 PM

## 2012-08-31 NOTE — Progress Notes (Signed)
CARDIAC REHAB PHASE I   PRE:  Rate/Rhythm: 73SR  BP:  Supine:   Sitting: 90/50  Standing:    SaO2: 98%RA  MODE:  Ambulation: 500 ft   POST:  Rate/Rhythem: 78  BP:  Supine:   Sitting: 118/58  Standing:    SaO2: 99%RA 1452-1507 Cut walk short per request of MD who needed to see pt. Pt tolerated walk well. Back to recliner after walk. Walked 500 ft with steady gait.  [name] [name]

## 2012-08-31 NOTE — Progress Notes (Signed)
PARENTERAL NUTRITION CONSULT NOTE - FOLLOW UP  Pharmacy Consult: TPN Indication: Chylothorax  No Known Allergies  Patient Measurements: Height: 6' (182.9 cm) Weight: 216 lb 11.4 oz (98.3 kg) IBW/kg (Calculated) : 77.6  Adjusted Body Weight: 83.9 kg Usual Weight: 98.6 kg  Vital Signs: Temp: 98.3 F (36.8 C) (10/10 0531) Temp src: Oral (10/10 0531) BP: 96/53 mmHg (10/10 0531) Pulse Rate: 74  (10/10 0531) Intake/Output from previous day: 10/09 0701 - 10/10 0700 In: 16535.8 [I.V.:3320; TPN:13215.8] Out: 1635 [Urine:1075; Chest Tube:560]  Labs:  [facility] 08/31/12 0600 08/30/12 0500 08/29/12 0514  WBC 11.6* 9.4 11.2*  HGB 13.6 13.8 14.2  HCT 39.2 39.9 41.1  PLT 428* 415* 463*  APTT -- -- --  INR -- -- --     Basename 08/31/12 0600 08/30/12 0500 08/29/12 0514  NA 128* 128* 129*  K 4.0 3.9 4.2  CL 91* 90* 92*  CO2 29 26 29   GLUCOSE 136* 132* 125*  BUN 13 14 14   CREATININE 0.69 0.60 0.64  LABCREA -- -- --  CREAT24HRUR -- -- --  CALCIUM 8.5 8.5 8.9  MG 2.2 -- --  PHOS 3.3 -- --  PROT 6.0 6.0 6.5  ALBUMIN 2.5* 2.5* 2.7*  AST 17 18 14   ALT 11 11 8   ALKPHOS 60 61 58  BILITOT 0.3 0.5 0.4  BILIDIR -- -- --  IBILI -- -- --  PREALBUMIN -- -- --  TRIG -- -- --  CHOLHDL -- -- --  CHOL -- -- --   Estimated Creatinine Clearance: 128.3 ml/min (by C-G formula based on Cr of 0.69).    Basename 08/31/12 0529 08/30/12 2359 08/30/12 2014  GLUCAP 147* 143* 132*     Insulin Requirements in the past 24 hours:  15 units on moderate SSI TIDac   Current Nutrition:  TPN + clear liquid diet  Assessment:  56 YOM admitted 9/22 with syncope and sustained VT with STEMI, emergent cath/PCI, stent, temp. pacer. Developed respiratory distress with VDRF, found to have PNA and moderate right pleural effusion on chest CT.  Found to have 300 cc chylous pleural fluid out of chest tube in 12h on 08/24/12. Diet changed to NPO and orders for TNA per pharmacy.  Prealbumin still WNL but has  decreased, likely due to stress of illness.  Patient started on PO diet and output is increasing.  Anticipate slow advancement in diet if not changed back to NPO status, so will continue TPN at goal rate.     GI: intact, NPO/TPN to decrease chyle, CT O/P increasing with initiation of PO diet, PO multivitamin, PO PPI, drinking supplements Endo: TSH elevated, T4 WNL, T3 slightly low.  no hx DM - CBGs have been well controlled and only requires SSI.  One elevated CBG (?d/t oral supplementation) Lytes: chronic hyponatremia - Na 128;  Cl also slightly low, on scheduled mag oxide 400 mg BID and  KCL 30 mEq BID Renal: SCr stable, CrCL ~128 ml/min, good UOP, NS at [facility] Hepatobil: LFTs / TG WNL. Prealbumin down 18.7 but still WNL Pulm: stable on RA - on Xopenex Neuro: WNL  Cards: hx systolic CHF (EF 40%). STEMI s/p PCI on amiodarone, ASA 81mg , lisinopril, Magox, metoprolol, KCL, Brilinta -- BP remains soft, HR ok - amio increased BID due to vtach 10/7 AM, none since 10/8 AM; need ICD but not indicated if has recurrent malignancy, cannot r/o since pt refused PET scan, Life-Vest in the meantime  ID: s/p Ancef for MSSA in sputum 9/24;  afebrile, WBC trending up Heme/Onc: hx small celll lung cancer; no clear signs of recurrent cancer per chest CT. Pt refused PET scan - H/H WNL, thrombocytosis Best Practices:  TPN access: PICC PO PPI VTE px: SCDs   Nutritional Goals:  2100-2300 kcal and 100-120 protein   Plan:  - Continue Clinimix E 5/20 at 83 ml/hr, providing 93% of minimal kCal and 100% of minimal protein needs - Provide lipids at 10 ml/hr MWF only due to national shortage  - Continue PO multivitamin daily (no MVI / trace elements in TPN) - F/U PO intake, output and wean TPN as appropriate     Kaelon Weekes D. Laney Potash, PharmD, BCPS Pager:  [phone] 08/31/2012, 7:39 AM

## 2012-08-31 NOTE — Progress Notes (Addendum)
18 Days Post-Op Procedure(s) (LRB): LEFT HEART CATHETERIZATION WITH CORONARY ANGIOGRAM (N/A) TEMPORARY PACEMAKER INSERTION (Right) PERCUTANEOUS CORONARY STENT INTERVENTION (PCI-S) (Right) Subjective:  [name] [name] has no complaints this morning.  He asked if he can have regular food, because he only received liquids for breakfast.   Objective: Vital signs in last 24 hours: Temp:  [97.7 F (36.5 C)-98.3 F (36.8 C)] 98.3 F (36.8 C) (10/10 0531) Pulse Rate:  [65-74] 74  (10/10 0531) Cardiac Rhythm:  [-] Normal sinus rhythm (10/09 1945) Resp:  [18] 18  (10/09 2017) BP: (95-96)/(53-62) 96/53 mmHg (10/10 0531) SpO2:  [97 %-99 %] 97 % (10/10 0531) Weight:  [216 lb 11.4 oz (98.3 kg)] 216 lb 11.4 oz (98.3 kg) (10/10 0531)  Intake/Output from previous day: 10/09 0701 - 10/10 0700 In: 16535.8 [I.V.:3320; WUJ:81191.4] Out: 1635 [Urine:1075; Chest Tube:560]  General appearance: alert, cooperative and no distress Heart: regular rate and rhythm Lungs: clear to auscultation bilaterally Abdomen: soft, non-tender; bowel sounds normal; no masses,  no organomegaly Wound: clean and dry  Lab Results:  Basename 08/31/12 0600 08/30/12 0500  WBC 11.6* 9.4  HGB 13.6 13.8  HCT 39.2 39.9  PLT 428* 415*   BMET:  Basename 08/31/12 0600 08/30/12 0500  NA 128* 128*  K 4.0 3.9  CL 91* 90*  CO2 29 26  GLUCOSE 136* 132*  BUN 13 14  CREATININE 0.69 0.60  CALCIUM 8.5 8.5    PT/INR: No results found for this basename: LABPROT,INR in the last 72 hours ABG    Component Value Date/Time   PHART 7.498* 08/17/2012 0447   HCO3 28.1* 08/17/2012 0447   TCO2 29 08/17/2012 0447   O2SAT 99.0 08/17/2012 0447   CBG (last 3)   Basename 08/31/12 0529 08/30/12 2359 08/30/12 2014  GLUCAP 147* 143* 132*    Assessment/Plan: S/P Procedure(s) (LRB): LEFT HEART CATHETERIZATION WITH CORONARY ANGIOGRAM (N/A) TEMPORARY PACEMAKER INSERTION (Right) PERCUTANEOUS CORONARY STENT INTERVENTION (PCI-S) (Right)  1.  Chest tube- drainage output increased yesterday and fluid appears to becoming increasingly cloudy- will leave chest tube in place 2. Diet- patient currently on clear liquid diet, IV Fat Emulsion, and Vital feeding supplement 3. Dispo- patients output is increasing and becoming increasingly cloudy, will most likely need talc pleurodesis if doesn't improve   LOS: 18 days    BARRETT, ERIN 08/31/2012    Chart reviewed, patient examined, agree with above. Chest tube output increased to 560cc yesterday after diet resumed and the fluid is cloudy. He had regular fat modified diet for dinner but is back on clear liquids this am. I think talc pleurodesis is probably going to be the best, least-invasive way to try to get this to stop. It isn't clear if this is related to recurrent cancer but suspicious given his history and he doesn't have any other reason to have this. Thoracic duct ligation is usually not effective for chylothorax due to cancer.

## 2012-08-31 NOTE — Progress Notes (Signed)
Pt. Seen and examined. Agree with the NP/PA-C note as written.  Appreciate Dr. Sharee Pimple suggestion for talc pleurodesis. Cardiac issues remain stable. We could consider lifevest, however, if he has cancer with a poor prognosis, then he may not be an AICD candidate and a lifevest would be a bridge to nowhere.  Unclear why he does not want the PET scan other than denial, I guess. We should continue amiodarone.  Chrystie Nose, MD, [facility] Attending Cardiologist [facility]

## 2012-08-31 NOTE — Progress Notes (Signed)
Subjective: Pt stable overnight with no new complaints.  No increased wob.  Chest tube still draining.   Objective:  Intake/Output from previous day: 10/09 0701 - 10/10 0700 In: 16535.8 [I.V.:3320; OZH:08657.8] Out: 1635 [Urine:1075; Chest Tube:560]   Physical Exam: Vital signs in last 24 hours:  BP 96/53  Pulse 74  Temp 98.3 F (36.8 C) (Oral)  Resp 18  Ht 6' (1.829 m)  Wt 98.3 kg (216 lb 11.4 oz)  BMI 29.39 kg/m2  SpO2 97%   wd male in nad, sleeping Nose without purulence or discharge noted. Chest with crackles on right. Rt chest tube with cloudy amber drainage. 360 cc over 24 hours. Cor with rrr abd benign LE without significant edema, no cyanosis Alert and oriented, moves all 4.   Lab Results:  Basename 08/31/12 0600 08/30/12 0500 08/29/12 0514  WBC 11.6* 9.4 11.2*  HGB 13.6 13.8 14.2  HCT 39.2 39.9 41.1  PLT 428* 415* 463*   BMET  Basename 08/31/12 0600 08/30/12 0500 08/29/12 0514  NA 128* 128* 129*  K 4.0 3.9 4.2  CL 91* 90* 92*  CO2 29 26 29   GLUCOSE 136* 132* 125*  BUN 13 14 14   CREATININE 0.69 0.60 0.64  CALCIUM 8.5 8.5 8.9    Studies/Results: Ct Chest Wo Contrast  08/29/2012  *RADIOLOGY REPORT*  Clinical Data: Lung carcinoma.  Right-sided chest pain.  Postop right pneumothorax  CT CHEST WITHOUT CONTRAST  Technique:  Multidetector CT imaging of the chest was performed following the standard protocol without IV contrast.  Comparison: 08/24/2012  Findings: A small less than 10% right pneumothorax shows no significant change in size, and right chest tube remains in place. Paramediastinal radiation changes involving both upper lobes, right side greater than left, are stable in appearance.  There is no evidence of acute infiltrate, and no suspicious pulmonary nodules or masses are identified.  No evidence of pleural effusion.  1.2 cm left superior mediastinal lymph node in the prevascular space remains stable.  No other pathologically enlarged nodes are  identified within the thorax.  IMPRESSION:  1.  Stable small less than 10% right pneumothorax with right chest tube in place. 2.  Stable mild left superior mediastinal lymphadenopathy. 3.  Stable upper lobe radiation changes.   Original Report Authenticated By: Danae Orleans, M.D.    Principal Problem:  *Sustained ventricular tachycardia with associated syncope; Monomorphic Active Problems:  Hx of cancer of lung, 3 years ago treated with radiation and chemo   GERD (gastroesophageal reflux disease)  Hyperglycemia, pt has been on meds for diabetes in the past  Cardiomyopathy, ischemic, by cardiac cath 30-35% 08/13/12   CAD (coronary artery disease) 3 vessel disease  S/P coronary artery stent placement, acutely to RCA for acute Post. 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 We will initiate fat modified diet with medium chain triglycerides per nutrition recommendation and follow up on chest tube drainage and pleural fluid triglycerides in 2-3 days after diet initiation; Appreciate the help; with diet change we will monitor fluid characteristics; if no change in effusion would d/c chest tube; if  rea cumulation/recurrent chylothorax after initiating diet would recommend talc pleurodesis before proceeding to chylous duct ligation/embolization. CT surgery following.  Will continue with conservative therapy, and hope this resolves.    10/10 increased drainage from chest tube(360 cc) and cloudy. Talc pleurodesis may be needed per CVTS 10/9 repeat plural fluid triglycerides  CVTS follow up.10/9  see note   Given h/o cancer there is a concern that the chylothorax is related to recurrent malignancy; I discussed with the patient the possibility of obtaining a PET. He refused to have a PET now or in the future. He would like to follow up in regards to his cancer with his primary oncologist.   Brett Canales Minor ACNP [facility] Pager [phone] till 3 pm If no answer page [phone] 08/31/2012, 10:24 AM   I personally have seen and examined the patient. Agree with Brett Canales Minor's note. The patient's wife is at the bedside, I updated her on healthcare plan. I would continue to monitor chest tube drainage for 24 more h on fat modified diet. If drainage improved on oral diet would continue conservative therapy. If drainage is not improving while on oral diet, would recommend reinstituting NPO/TPN and when drainage/chylothrax improved perform pleurodesis. I defer detailed plan of pleurodesis to Dr. Laneta Simmers. Appreciate the help.   Vanetta Mulders, MD  [facility]  [place], [place]  [phone]

## 2012-08-31 NOTE — Progress Notes (Signed)
Subjective: No complaints, increase of chest tube drainage, +560 yesterday  Objective: Vital signs in last 24 hours: Temp:  [97.7 F (36.5 C)-98.3 F (36.8 C)] 98.3 F (36.8 C) (10/10 0531) Pulse Rate:  [65-74] 74  (10/10 0531) Resp:  [18] 18  (10/09 2017) BP: (95-96)/(53-62) 96/53 mmHg (10/10 0531) SpO2:  [97 %-99 %] 97 % (10/10 0531) Weight:  [98.3 kg (216 lb 11.4 oz)] 98.3 kg (216 lb 11.4 oz) (10/10 0531) Weight change: 0.459 kg (1 lb 0.2 oz) Last BM Date: 08/30/12 Intake/Output from previous day: + inaccurate RN checking numbers 10/09 0701 - 10/10 0700 In: 16535.8 [I.V.:3320; WUJ:81191.4] Out: 1635 [Urine:1075; Chest Tube:560] Intake/Output this shift:    PE: General: Skin: HEENT: Neck: Heart: Lungs: Abd: Ext: Neuro:   Lab Results:  Basename 08/31/12 0600 08/30/12 0500  WBC 11.6* 9.4  HGB 13.6 13.8  HCT 39.2 39.9  PLT 428* 415*   BMET  Basename 08/31/12 0600 08/30/12 0500  NA 128* 128*  K 4.0 3.9  CL 91* 90*  CO2 29 26  GLUCOSE 136* 132*  BUN 13 14  CREATININE 0.69 0.60  CALCIUM 8.5 8.5   No results found for this basename: TROPONINI:2,CK,MB:2 in the last 72 hours  Lab Results  Component Value Date   CHOL 156 08/22/2012   HDL 35* 08/16/2012   LDLCALC 74 08/16/2012   TRIG 94 08/28/2012   CHOLHDL 3.7 08/16/2012   Lab Results  Component Value Date   HGBA1C 5.9* 08/13/2012     Lab Results  Component Value Date   TSH 11.427* 08/28/2012    Hepatic Function Panel  Basename 08/31/12 0600  PROT 6.0  ALBUMIN 2.5*  AST 17  ALT 11  ALKPHOS 60  BILITOT 0.3  BILIDIR --  IBILI --   No results found for this basename: CHOL in the last 72 hours No results found for this basename: PROTIME in the last 72 hours    EKG: Orders placed during the hospital encounter of 08/13/12  . EKG 12-LEAD  . EKG 12-LEAD  . EKG 12-LEAD  . EKG 12-LEAD  . EKG 12-LEAD  . EKG 12-LEAD  . EKG 12-LEAD  . EKG 12-LEAD  . EKG 12-LEAD  . EKG 12-LEAD  . EKG  . EKG  12-LEAD  . EKG 12-LEAD  . EKG 12-LEAD  . EKG 12-LEAD    Studies/Results: Ct Chest Wo Contrast  08/29/2012  *RADIOLOGY REPORT*  Clinical Data: Lung carcinoma.  Right-sided chest pain.  Postop right pneumothorax  CT CHEST WITHOUT CONTRAST  Technique:  Multidetector CT imaging of the chest was performed following the standard protocol without IV contrast.  Comparison: 08/24/2012  Findings: A small less than 10% right pneumothorax shows no significant change in size, and right chest tube remains in place. Paramediastinal radiation changes involving both upper lobes, right side greater than left, are stable in appearance.  There is no evidence of acute infiltrate, and no suspicious pulmonary nodules or masses are identified.  No evidence of pleural effusion.  1.2 cm left superior mediastinal lymph node in the prevascular space remains stable.  No other pathologically enlarged nodes are identified within the thorax.  IMPRESSION:  1.  Stable small less than 10% right pneumothorax with right chest tube in place. 2.  Stable mild left superior mediastinal lymphadenopathy. 3.  Stable upper lobe radiation changes.   Original Report Authenticated By: Danae Orleans, M.D.     Medications: I have reviewed the patient's current medications.    Marland Kitchen  amiodarone  400 mg Oral BID  . aspirin EC  81 mg Oral Daily  . feeding supplement (VITAL 1.0 CAL)  237 mL Oral BID BM  . insulin aspart  0-15 Units Subcutaneous Q4H  . lidocaine  1 patch Transdermal Q24H  . lisinopril  2.5 mg Oral Daily  . magnesium oxide  400 mg Per NG tube BID  . metoprolol tartrate  12.5 mg Oral TID  . multivitamin with minerals  1 tablet Oral Daily  . pantoprazole  40 mg Oral Q0600  . potassium chloride  30 mEq Oral BID  . sodium chloride  10-40 mL Intracatheter Q12H  . Ticagrelor  90 mg Oral BID   Assessment/Plan: Principal Problem:  *Sustained ventricular tachycardia with associated syncope; Monomorphic Active Problems:  Hx of cancer of  lung, 3 years ago treated with radiation and chemo   GERD (gastroesophageal reflux disease)  Hyperglycemia, pt has been on meds for diabetes in the past  Cardiomyopathy, ischemic, by cardiac cath 30-35% 08/13/12   CAD (coronary artery disease) 3 vessel disease  S/P coronary artery stent placement, acutely to RCA for acute Post. 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 Stop life vest.   Also see Dr. Sharee Pimple note, plan for talc pleurodesis.  No further episodes of VTACH in last 48 hours on increased dose of amiodarone- will discharge on 400 mg daily.  LOS: 18 days   [name],[name] 08/31/2012, 9:17 AM

## 2012-09-01 ENCOUNTER — Inpatient Hospital Stay (HOSPITAL_COMMUNITY): Payer: BC Managed Care – PPO

## 2012-09-01 DIAGNOSIS — I898 Other specified noninfective disorders of lymphatic vessels and lymph nodes: Secondary | ICD-10-CM

## 2012-09-01 LAB — CBC WITH DIFFERENTIAL/PLATELET
Hemoglobin: 13.4 g/dL (ref 13.0–17.0)
Lymphocytes Relative: 11 % — ABNORMAL LOW (ref 12–46)
Lymphs Abs: 1.1 10*3/uL (ref 0.7–4.0)
MCH: 30.5 pg (ref 26.0–34.0)
Monocytes Relative: 12 % (ref 3–12)
Neutro Abs: 6 10*3/uL (ref 1.7–7.7)
Neutrophils Relative %: 63 % (ref 43–77)
RBC: 4.4 MIL/uL (ref 4.22–5.81)
WBC: 9.5 10*3/uL (ref 4.0–10.5)

## 2012-09-01 LAB — GLUCOSE, CAPILLARY
Glucose-Capillary: 149 mg/dL — ABNORMAL HIGH (ref 70–99)
Glucose-Capillary: 149 mg/dL — ABNORMAL HIGH (ref 70–99)
Glucose-Capillary: 157 mg/dL — ABNORMAL HIGH (ref 70–99)
Glucose-Capillary: 170 mg/dL — ABNORMAL HIGH (ref 70–99)

## 2012-09-01 LAB — COMPREHENSIVE METABOLIC PANEL
BUN: 12 mg/dL (ref 6–23)
CO2: 30 mEq/L (ref 19–32)
Calcium: 8.5 mg/dL (ref 8.4–10.5)
Chloride: 90 mEq/L — ABNORMAL LOW (ref 96–112)
Creatinine, Ser: 0.65 mg/dL (ref 0.50–1.35)
GFR calc Af Amer: >90 mL/min (ref >90)
GFR calc non Af Amer: >90 mL/min (ref >90)
Glucose, Bld: 125 mg/dL — ABNORMAL HIGH (ref 70–99)
Total Bilirubin: 0.4 mg/dL (ref 0.3–1.2)

## 2012-09-01 MED ORDER — CLINIMIX E/DEXTROSE (5/20) 5 % IV SOLN
INTRAVENOUS | Status: AC
  Administered 2012-09-01: 17:00:00 via INTRAVENOUS
  Filled 2012-09-01: qty 2000

## 2012-09-01 MED ORDER — OXYCODONE HCL 5 MG PO TABS
10.0000 mg | ORAL_TABLET | ORAL | Status: DC | PRN
  Administered 2012-09-01 – 2012-09-07 (×16): 10 mg via ORAL
  Filled 2012-09-01 (×17): qty 2

## 2012-09-01 MED ORDER — TALC 5 G PL SUSR
3.0000 g | Freq: Once | INTRAVENOUS | Status: AC
  Administered 2012-09-01: 3 g via INTRAPLEURAL
  Filled 2012-09-01: qty 3

## 2012-09-01 MED ORDER — FAT EMULSION 20 % IV EMUL
250.0000 mL | INTRAVENOUS | Status: AC
  Administered 2012-09-01: 250 mL via INTRAVENOUS
  Filled 2012-09-01: qty 250

## 2012-09-01 NOTE — Progress Notes (Addendum)
PARENTERAL NUTRITION CONSULT NOTE - FOLLOW UP  Pharmacy Consult: TPN Indication: Chylothorax  No Known Allergies  Patient Measurements: Height: 6' (182.9 cm) Weight: 216 lb 7.9 oz (98.2 kg) IBW/kg (Calculated) : 77.6  Adjusted Body Weight: 83.9 kg Usual Weight: 98.6 kg  Vital Signs: Temp: 98.1 F (36.7 C) (10/11 0506) Temp src: Oral (10/11 0506) BP: 104/66 mmHg (10/11 0506) Pulse Rate: 67  (10/11 0506) Intake/Output from previous day: 10/10 0701 - 10/11 0700 In: 14614.1 [P.O.:480; KVQ:25956.3] Out: 3050 [Urine:2350; Chest Tube:700]  Labs:  Basename 09/01/12 0500 08/31/12 0600 08/30/12 0500  WBC 9.5 11.6* 9.4  HGB 13.4 13.6 13.8  HCT 38.4* 39.2 39.9  PLT 387 428* 415*  APTT -- -- --  INR -- -- --     Basename 09/01/12 0500 08/31/12 0600 08/30/12 0500  NA 127* 128* 128*  K 4.1 4.0 3.9  CL 90* 91* 90*  CO2 30 29 26   GLUCOSE 125* 136* 132*  BUN 12 13 14   CREATININE 0.65 0.69 0.60  LABCREA -- -- --  CREAT24HRUR -- -- --  CALCIUM 8.5 8.5 8.5  MG -- 2.2 --  PHOS -- 3.3 --  PROT 5.9* 6.0 6.0  ALBUMIN 2.5* 2.5* 2.5*  AST 17 17 18   ALT 12 11 11   ALKPHOS 57 60 61  BILITOT 0.4 0.3 0.5  BILIDIR -- -- --  IBILI -- -- --  PREALBUMIN -- -- --  TRIG -- -- --  CHOLHDL -- -- --  CHOL -- -- --   Estimated Creatinine Clearance: 128.1 ml/min (by C-G formula based on Cr of 0.65).    Basename 09/01/12 0802 09/01/12 0508 09/01/12 0017  GLUCAP 164* 170* 157*   Insulin Requirements in the past 24 hours:  8 units on moderate SSI TIDac   Current Nutrition:  TPN + clear liquid diet + vital 1.0 cal liquid BID  Nutritional Goals:  2100-2300 kcal and 100-120 protein  Assessment:  56 YOM admitted 9/22 with syncope and sustained VT with STEMI, emergent cath/PCI, stent, temp. pacer. Developed respiratory distress with VDRF, found to have PNA and moderate right pleural effusion on chest CT.  Found to have 300 cc chylous pleural fluid out of chest tube in 12h on 08/24/12. Diet  changed to NPO and orders for TNA per pharmacy.     GI: intact, TPN to decrease chyle- pt with goo intake os clears and Vital 1.0 liquid BID. CT O/P increasing with initiation of PO diet, PO multivitamin, PO PPI. Per RD, patient is eating breads with clear liquid diet, so if likely getting more calories than a std clear liquid diet. Endo: TSH elevated, T4 WNL, T3 slightly low.  no hx DM - CBGs hyave trended up, but remain < 180 with SSI supplementation. Suspect incr in CBG is d/t clears. Lytes: chronic hyponatremia;  Cl also slightly low, on scheduled mag oxide 400 mg BID and  KCL 30 mEq BID. Other lytes remain WNL. Renal: SCr stable, CrCL ~128 ml/min, good UOP at 1 ml/kg/hr, NS at [facility] Hepatobil: LFTs / TG WNL. Prealbumin down 18.7 but still WNL- suspect down trend d/t ongoing inflammation. Pulm: stable on RA - on Xopenex. Noted plans for talc pleurodesis today. Neuro: WNL  Cards: hx systolic CHF (EF ~87%). STEMI s/p PCI on amiodarone, ASA 81mg , lisinopril, Magox, metoprolol, KCL, Brilinta -- BP remains soft, HR ok - amio increased BID due to vtach 10/7 AM, none since 10/8 AM; need ICD but not indicated if has recurrent malignancy, cannot  r/o since pt refused PET scan, Life-Vest in the meantime  ID: s/p Ancef for MSSA in sputum 9/24; afebrile, WBC trending up Heme/Onc: hx small cell lung cancer; no clear signs of recurrent cancer per chest CT. Pt refused PET scan - H/H WNL, thrombocytosis Best Practices: PO PPI, SCDs TPN access: PICC TPN days: 9  Plan:  - Decrease Clinimix E 5/20 to 53ml/hr, providing 75% of minimal kCal and 75% of minimal protein needs given PO intake. - Provide lipids at 10 ml/hr MWF only due to national shortage  - Continue PO multivitamin daily (no MVI / trace elements in TPN) - F/U PO intake, CT output and wean TPN as appropriate - F/up CBGs and consider adding Lantus to his regimen if needed. - Will f/up labs on Monday   Thanks, Jacksyn Beeks K. Allena Katz, PharmD, BCPS.    Clinical Pharmacist Pager [phone]. 09/01/2012 8:40 AM

## 2012-09-01 NOTE — Progress Notes (Signed)
Pt declines walking. Sts he is in pain. Will f/u tomorrow. Encouraged walking tonight. Ethelda Chick CES, ACSM

## 2012-09-01 NOTE — Progress Notes (Signed)
Subjective: Pt stable overnight with no new complaints.  No increased wob.  Chest tube still draining.   Objective:  Intake/Output from previous day: 10/10 0701 - 10/11 0700 In: 14614.1 [P.O.:480; ZOX:09604.5] Out: 3050 [Urine:2350; Chest Tube:700]   Physical Exam: Vital signs in last 24 hours:  BP 104/66  Pulse 67  Temp 98.1 F (36.7 C) (Oral)  Resp 18  Ht 6' (1.829 m)  Wt 98.2 kg (216 lb 7.9 oz)  BMI 29.36 kg/m2  SpO2 95%   wd male in nad, sleeping Nose without purulence or discharge noted. Chest with crackles on right. Rt chest tube with cloudy amber drainage. 170 cc over 24 hours. Cor with rrr abd benign LE without significant edema, no cyanosis Alert and oriented, moves all 4.   Lab Results:  Basename 09/01/12 0500 08/31/12 0600 08/30/12 0500  WBC 9.5 11.6* 9.4  HGB 13.4 13.6 13.8  HCT 38.4* 39.2 39.9  PLT 387 428* 415*   BMET  Basename 09/01/12 0500 08/31/12 0600 08/30/12 0500  NA 127* 128* 128*  K 4.1 4.0 3.9  CL 90* 91* 90*  CO2 30 29 26   GLUCOSE 125* 136* 132*  BUN 12 13 14   CREATININE 0.65 0.69 0.60  CALCIUM 8.5 8.5 8.5    Studies/Results: Dg Chest Port 1 View  09/01/2012  *RADIOLOGY REPORT*  Clinical Data: Right pneumothorax and chest tube  PORTABLE CHEST - 1 VIEW  Comparison: Prior chest x-ray 08/28/2012, CT chest 08/29/2012  Findings: Unchanged position of right thoracostomy tube and left upper extremity approach PICC.  The PICC catheter tip projects over the mid superior vena cava.  Trace residual right apical pneumothorax has improved since the prior study.  Unchanged mild cardiomegaly.  Ground-glass opacity in the right perihilar and medial upper lobe remain unchanged compared to prior.  IMPRESSION:  1. Trace residual right apical pneumothorax 2.  Stable satisfactory support apparatus   Original Report Authenticated By: Sterling Big, M.D.    Principal Problem:  *Sustained ventricular tachycardia with associated syncope;  Monomorphic Active Problems:  Hx of cancer of lung, 3 years ago treated with radiation and chemo   GERD (gastroesophageal reflux disease)  Hyperglycemia, pt has been on meds for diabetes in the past  Cardiomyopathy, ischemic, by cardiac cath 30-35% 08/13/12   CAD (coronary artery disease) 3 vessel disease  S/P coronary artery stent placement, acutely to RCA for acute Post. 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 We will initiate fat modified diet with medium chain triglycerides per nutrition recommendation and follow up on chest tube drainage and pleural fluid triglycerides in 2-3 days after diet initiation; Appreciate the help; with diet change we will monitor fluid characteristics; if no change in effusion would d/c chest tube; if rea cumulation/recurrent chylothorax after initiating diet would recommend talc pleurodesis before proceeding to chylous duct ligation/embolization. CT surgery following.  Will continue with conservative therapy, and hope this resolves.    10/10 increased drainage from chest tube(360 cc) and cloudy. 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 He refused to have a PET now or in the future. He would like to follow up in regards to his cancer with his primary oncologist.   Brett Canales Minor ACNP [facility]  PCCM Pager [phone] till 3 pm If no answer page [phone] 09/01/2012, 9:50 AM      I agree wth the note above. Luisa Hart WrightMD

## 2012-09-01 NOTE — Progress Notes (Signed)
Subjective: No new complaints  Objective: Vital signs in last 24 hours: Temp:  [98.1 F (36.7 C)-98.5 F (36.9 C)] 98.1 F (36.7 C) (10/11 0506) Pulse Rate:  [67-76] 67  (10/11 0506) BP: (95-104)/(51-67) 104/66 mmHg (10/11 0506) SpO2:  [95 %-99 %] 95 % (10/11 0506) Weight:  [98.2 kg (216 lb 7.9 oz)] 98.2 kg (216 lb 7.9 oz) (10/11 0506) Weight change: -0.1 kg (-3.5 oz) Last BM Date: 08/30/12 Intake/Output from previous day:  ? I&O  +11,564 will check with nurse.  700 cc out with chest tube 10/10 0701 - 10/11 0700 In: 14614.1 [P.O.:480; TPN:14134.1] Out: 3050 [Urine:2350; Chest Tube:700] Intake/Output this shift: Total I/O In: -  Out: 225 [Urine:225]  PE: General:alert and oriented, no complaints except tenderness at chest tube site Heart:S1S2 RRR Lungs:clear without rales or rhonchi Abd:+ BS, soft, non tender Ext:No edema  Neuro:alert and oriented   Cloudy chest tube drainage.  Lab Results:  Basename 09/01/12 0500 08/31/12 0600  WBC 9.5 11.6*  HGB 13.4 13.6  HCT 38.4* 39.2  PLT 387 428*   BMET  Basename 09/01/12 0500 08/31/12 0600  NA 127* 128*  K 4.1 4.0  CL 90* 91*  CO2 30 29  GLUCOSE 125* 136*  BUN 12 13  CREATININE 0.65 0.69  CALCIUM 8.5 8.5   No results found for this basename: TROPONINI:2,CK,MB:2 in the last 72 hours  Lab Results  Component Value Date   CHOL 156 08/22/2012   HDL 35* 08/16/2012   LDLCALC 74 08/16/2012   TRIG 94 08/28/2012   CHOLHDL 3.7 08/16/2012   Lab Results  Component Value Date   HGBA1C 5.9* 08/13/2012     Lab Results  Component Value Date   TSH 11.427* 08/28/2012    Hepatic Function Panel  Basename 09/01/12 0500  PROT 5.9*  ALBUMIN 2.5*  AST 17  ALT 12  ALKPHOS 57  BILITOT 0.4  BILIDIR --  IBILI --    EKG: Orders placed during the hospital encounter of 08/13/12  . EKG 12-LEAD  . EKG 12-LEAD  . EKG 12-LEAD  . EKG 12-LEAD  . EKG 12-LEAD  . EKG 12-LEAD  . EKG 12-LEAD  . EKG 12-LEAD  . EKG 12-LEAD  . EKG  12-LEAD  . EKG  . EKG 12-LEAD  . EKG 12-LEAD  . EKG 12-LEAD  . EKG 12-LEAD    Studies/Results: Dg Chest Port 1 View  09/01/2012  *RADIOLOGY REPORT*  Clinical Data: Right pneumothorax and chest tube  PORTABLE CHEST - 1 VIEW  Comparison: Prior chest x-ray 08/28/2012, CT chest 08/29/2012  Findings: Unchanged position of right thoracostomy tube and left upper extremity approach PICC.  The PICC catheter tip projects over the mid superior vena cava.  Trace residual right apical pneumothorax has improved since the prior study.  Unchanged mild cardiomegaly.  Ground-glass opacity in the right perihilar and medial upper lobe remain unchanged compared to prior.  IMPRESSION:  1. Trace residual right apical pneumothorax 2.  Stable satisfactory support apparatus   Original Report Authenticated By: Sterling Big, M.D.     Medications: I have reviewed the patient's current medications. Scheduled Meds:   . amiodarone  400 mg Oral BID  . aspirin EC  81 mg Oral Daily  . feeding supplement (VITAL 1.0 CAL)  237 mL Oral BID BM  . insulin aspart  0-15 Units Subcutaneous Q4H  . lidocaine  1 patch Transdermal Q24H  . lisinopril  2.5 mg Oral Daily  . magnesium oxide  400  mg Per NG tube BID  . metoprolol tartrate  12.5 mg Oral TID  . multivitamin with minerals  1 tablet Oral Daily  . pantoprazole  40 mg Oral Q0600  . potassium chloride  30 mEq Oral BID  . sodium chloride  10-40 mL Intracatheter Q12H  . talc slurry in NS +/- lidocaine  3 g Intrapleural Once  . Ticagrelor  90 mg Oral BID   Continuous Infusions:   . sodium chloride 20 mL/hr at 08/24/12 0600  . fat emulsion 250 mL (08/31/12 0730)  . fat emulsion    . TPN (CLINIMIX) +/- additives 83 mL/hr at 08/31/12 0730  . TPN (CLINIMIX) +/- additives 83 mL/hr at 08/31/12 1753  . TPN (CLINIMIX) +/- additives     PRN Meds:.acetaminophen, ALPRAZolam, diphenhydrAMINE, levalbuterol, ondansetron (ZOFRAN) IV, oxyCODONE, sodium chloride, traMADol,  zolpidem  Assessment/Plan: Principal Problem:  *Sustained ventricular tachycardia with associated syncope; Monomorphic Active Problems:  Hx of cancer of lung, 3 years ago treated with radiation and chemo   GERD (gastroesophageal reflux disease)  Hyperglycemia, pt has been on meds for diabetes in the past  Cardiomyopathy, ischemic, by cardiac cath 30-35% 08/13/12   CAD (coronary artery disease) 3 vessel disease  S/P coronary artery stent placement, acutely to RCA for acute Post. 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  Talc pleurodesis per TCTS-see Dr. Sharee Pimple note.  No further episodes of V TACH, last noted 08/29/12-- D/C on 400 mg daily of Amiodarone with lifevest  Life vest arranged they are waiting for call to place on the pt. And instruct.  He continues to ambulate with cardiac rehab.   LOS: 19 days   [name],[name] 09/01/2012, 10:24 AM    I have seen and examined the patient along with Nada Boozer, NP.  I have reviewed the chart, notes and new data.  I agree with PA/NP's note.  No arrhythmia in 72 hours. Had pleurodesis this AM. Will need sedation prior to PET scan - he was very claustrophobic and uncomfortable the first time. He thinks he is too sore to have it now.  PLAN: From CV standpoint anticipate DC on current meds and with Life Vest once CT can be removed.  Thurmon Fair, MD, [facility] [phone] 09/01/2012, 1:53 PM

## 2012-09-01 NOTE — Progress Notes (Addendum)
19 Days Post-Op Procedure(s) (LRB): LEFT HEART CATHETERIZATION WITH CORONARY ANGIOGRAM (N/A) TEMPORARY PACEMAKER INSERTION (Right) PERCUTANEOUS CORONARY STENT INTERVENTION (PCI-S) (Right) Subjective:  Mr. Steinfeldt has no new complaints this morning.  He is anxious about being able to go home.   Objective: Vital signs in last 24 hours: Temp:  [98.1 F (36.7 C)-98.5 F (36.9 C)] 98.1 F (36.7 C) (10/11 0506) Pulse Rate:  [67-76] 67  (10/11 0506) Cardiac Rhythm:  [-] Normal sinus rhythm;Heart block (10/10 2000) BP: (95-104)/(51-67) 104/66 mmHg (10/11 0506) SpO2:  [95 %-99 %] 95 % (10/11 0506) Weight:  [216 lb 7.9 oz (98.2 kg)] 216 lb 7.9 oz (98.2 kg) (10/11 0506)  Intake/Output from previous day: 10/10 0701 - 10/11 0700 In: 14614.1 [P.O.:480; ZOX:09604.5] Out: 3050 [Urine:2350; Chest Tube:700]  General appearance: alert, cooperative and no distress Heart: regular rate and rhythm Lungs: clear to auscultation bilaterally Abdomen: soft, non-tender; bowel sounds normal; no masses,  no organomegaly Wound: clean and dry  Lab Results:  Basename 09/01/12 0500 08/31/12 0600  WBC 9.5 11.6*  HGB 13.4 13.6  HCT 38.4* 39.2  PLT 387 428*   BMET:  Basename 09/01/12 0500 08/31/12 0600  NA 127* 128*  K 4.1 4.0  CL 90* 91*  CO2 30 29  GLUCOSE 125* 136*  BUN 12 13  CREATININE 0.65 0.69  CALCIUM 8.5 8.5    PT/INR: No results found for this basename: LABPROT,INR in the last 72 hours ABG    Component Value Date/Time   PHART 7.498* 08/17/2012 0447   HCO3 28.1* 08/17/2012 0447   TCO2 29 08/17/2012 0447   O2SAT 99.0 08/17/2012 0447   CBG (last 3)   Basename 09/01/12 0508 09/01/12 0017 08/31/12 2014  GLUCAP 170* 157* 149*    Assessment/Plan: S/P Procedure(s) (LRB): LEFT HEART CATHETERIZATION WITH CORONARY ANGIOGRAM (N/A) TEMPORARY PACEMAKER INSERTION (Right) PERCUTANEOUS CORONARY STENT INTERVENTION (PCI-S) (Right)  1. Chylothorax- chest tube drainage increased again yesterday  700cc of output, fluid remains cloudy 2. Diet- patient remains on clear liquid diet, and supplementation 3. Dispo- will plan for talc pleurodesis, will discuss timing with Dr. Laneta Simmers   LOS: 19 days    [name] [name] 09/01/2012    Chart reviewed, patient examined, agree with above. Chest tube output has increased with clear liquid diet yesterday. Sometimes anything po will increase lymph flow. I think talc pleurodesis is indicated. Would insert through chest tube and clamp for 6 hrs, then unclamp and continue drainage to water seal. His cxr today looks good with complete drainage of pleural space.

## 2012-09-01 NOTE — Progress Notes (Signed)
Nutrition Follow-up  Intervention:    TPN per pharmacy   Continue Vital 1.0 supplement (contains MCT oil) twice daily between meals (237 kcals, 9.5 gm protein per 8 fl oz can) RD to follow for nutrition care plan  Assessment:   Patient's chest tube still draining. PO intake 75% per flowsheet records. Vital 1.0 oral supplement twice daily.  Receiving TPN with Clinimix E 5/20 @ 83 ml/hr. Lipids (20% IVFE @ 10 ml/hr), multivitamins, and trace elements are provided 3 times weekly (MWF) due to national backorder. Provides 1959 kcal and 100 grams protein daily (based on weekly average). Meets 93% minimum estimated kcal and 100% minimum estimated protein needs.  RD spoke with Mirna Mires, PharmD regarding TPN prescription.  Diet Order:  Clear Liquids (fruit, vegetables & bread allowed per MD)  Meds: Scheduled Meds:   . amiodarone  400 mg Oral BID  . aspirin EC  81 mg Oral Daily  . feeding supplement (VITAL 1.0 CAL)  237 mL Oral BID BM  . insulin aspart  0-15 Units Subcutaneous Q4H  . lidocaine  1 patch Transdermal Q24H  . lisinopril  2.5 mg Oral Daily  . magnesium oxide  400 mg Per NG tube BID  . metoprolol tartrate  12.5 mg Oral TID  . multivitamin with minerals  1 tablet Oral Daily  . pantoprazole  40 mg Oral Q0600  . potassium chloride  30 mEq Oral BID  . sodium chloride  10-40 mL Intracatheter Q12H  . talc slurry in NS +/- lidocaine  3 g Intrapleural Once  . Ticagrelor  90 mg Oral BID   Continuous Infusions:   . sodium chloride 20 mL/hr at 08/24/12 0600  . fat emulsion 250 mL (08/31/12 0730)  . fat emulsion    . TPN (CLINIMIX) +/- additives 83 mL/hr at 08/31/12 0730  . TPN (CLINIMIX) +/- additives 83 mL/hr at 08/31/12 1753  . TPN (CLINIMIX) +/- additives     PRN Meds:.acetaminophen, ALPRAZolam, diphenhydrAMINE, levalbuterol, ondansetron (ZOFRAN) IV, oxyCODONE, sodium chloride, traMADol, zolpidem  Labs:  CMP     Component Value Date/Time   NA 127* 09/01/2012 0500   K 4.1  09/01/2012 0500   CL 90* 09/01/2012 0500   CO2 30 09/01/2012 0500   GLUCOSE 125* 09/01/2012 0500   BUN 12 09/01/2012 0500   CREATININE 0.65 09/01/2012 0500   CALCIUM 8.5 09/01/2012 0500   PROT 5.9* 09/01/2012 0500   ALBUMIN 2.5* 09/01/2012 0500   AST 17 09/01/2012 0500   ALT 12 09/01/2012 0500   ALKPHOS 57 09/01/2012 0500   BILITOT 0.4 09/01/2012 0500   GFRNONAA >90 09/01/2012 0500   GFRAA >90 09/01/2012 0500     Intake/Output Summary (Last 24 hours) at 09/01/12 1038 Last data filed at 09/01/12 0800  Gross per 24 hour  Intake 14374.1 ml  Output   3275 ml  Net 11099.1 ml    CBG (last 3)   Basename 09/01/12 0802 09/01/12 0508 09/01/12 0017  GLUCAP 164* 170* 157*    Weight Status: 98.2 kg (10/11) -- stable  Estimated needs: 2100-2300 kcals, 100-120 gm protein   Nutrition Dx: Altered GI function, improving   New Goal: Oral intake & TPN to meet >90% of estimated nutrition needs, met  Monitor: TPN prescription, PO intake, weight, labs, I/O's   Kirkland Hun, RD, LDN  Pager #: [phone]  After-Hours Pager #: [phone]

## 2012-09-01 NOTE — Progress Notes (Signed)
Pt vomited undigested emesis. PRN zofran given per order. Pt states he vomited after he stood up. Will continue to monitor.

## 2012-09-02 LAB — COMPREHENSIVE METABOLIC PANEL
AST: 14 U/L (ref 0–37)
Albumin: 2.4 g/dL — ABNORMAL LOW (ref 3.5–5.2)
Alkaline Phosphatase: 57 U/L (ref 39–117)
CO2: 28 mEq/L (ref 19–32)
Chloride: 89 mEq/L — ABNORMAL LOW (ref 96–112)
GFR calc non Af Amer: >90 mL/min (ref >90)
Potassium: 5 mEq/L (ref 3.5–5.1)
Total Bilirubin: 0.4 mg/dL (ref 0.3–1.2)

## 2012-09-02 LAB — GLUCOSE, CAPILLARY
Glucose-Capillary: 177 mg/dL — ABNORMAL HIGH (ref 70–99)
Glucose-Capillary: 174 mg/dL — ABNORMAL HIGH (ref 70–99)
Glucose-Capillary: 151 mg/dL — ABNORMAL HIGH (ref 70–99)
Glucose-Capillary: 153 mg/dL — ABNORMAL HIGH (ref 70–99)

## 2012-09-02 LAB — CBC WITH DIFFERENTIAL/PLATELET
Basophils Absolute: 0.1 10*3/uL (ref 0.0–0.1)
Basophils Relative: 0 % (ref 0–1)
HCT: 40 % (ref 39.0–52.0)
Hemoglobin: 14.2 g/dL (ref 13.0–17.0)
Lymphocytes Relative: 4 % — ABNORMAL LOW (ref 12–46)
MCHC: 35.5 g/dL (ref 30.0–36.0)
Monocytes Absolute: 2.4 10*3/uL — ABNORMAL HIGH (ref 0.1–1.0)
Monocytes Relative: 12 % (ref 3–12)
Neutro Abs: 17.1 10*3/uL — ABNORMAL HIGH (ref 1.7–7.7)
Neutrophils Relative %: 84 % — ABNORMAL HIGH (ref 43–77)
RDW: 13.2 % (ref 11.5–15.5)
WBC: 20.4 10*3/uL — ABNORMAL HIGH (ref 4.0–10.5)

## 2012-09-02 MED ORDER — CLINIMIX E/DEXTROSE (5/15) 5 % IV SOLN
INTRAVENOUS | Status: AC
  Administered 2012-09-02 – 2012-09-03 (×2): via INTRAVENOUS
  Filled 2012-09-02: qty 2000

## 2012-09-02 MED ORDER — METOPROLOL TARTRATE 25 MG PO TABS
25.0000 mg | ORAL_TABLET | Freq: Two times a day (BID) | ORAL | Status: DC
  Administered 2012-09-02 – 2012-09-07 (×9): 25 mg via ORAL
  Filled 2012-09-02 (×12): qty 1

## 2012-09-02 MED ORDER — LISINOPRIL 2.5 MG PO TABS
2.5000 mg | ORAL_TABLET | Freq: Every day | ORAL | Status: DC
  Administered 2012-09-03 – 2012-09-07 (×4): 2.5 mg via ORAL
  Filled 2012-09-02 (×5): qty 1

## 2012-09-02 NOTE — Progress Notes (Addendum)
20 Days Post-Op Procedure(s) (LRB): LEFT HEART CATHETERIZATION WITH CORONARY ANGIOGRAM (N/A) TEMPORARY PACEMAKER INSERTION (Right) PERCUTANEOUS CORONARY STENT INTERVENTION (PCI-S) (Right) Subjective:  [name] [name] has no complaints this morning.  States he is trying to sleep  Objective: Vital signs in last 24 hours: Temp:  [98.2 F (36.8 C)-98.7 F (37.1 C)] 98.7 F (37.1 C) (10/12 0509) Pulse Rate:  [67-79] 78  (10/12 0509) Cardiac Rhythm:  [-] Normal sinus rhythm (10/11 2000) Resp:  [18] 18  (10/11 1410) BP: (96-144)/(55-63) 101/63 mmHg (10/12 0509) SpO2:  [93 %-97 %] 93 % (10/12 0509) Weight:  [217 lb 8 oz (98.657 kg)] 217 lb 8 oz (98.657 kg) (10/12 0509)  Intake/Output from previous day: 10/11 0701 - 10/12 0700 In: 1707 [P.O.:960; TPN:747] Out: 2330 [Urine:2070; Chest Tube:260]  General appearance: alert, cooperative and no distress Heart: regular rate and rhythm Lungs: clear to auscultation bilaterally Abdomen: soft, non-tender; bowel sounds normal; no masses,  no organomegaly Wound: clean and dry  Lab Results:  Basename 09/02/12 0510 09/01/12 0500  WBC 20.4* 9.5  HGB 14.2 13.4  HCT 40.0 38.4*  PLT 395 387   BMET:  Basename 09/02/12 0510 09/01/12 0500  NA 125* 127*  K 5.0 4.1  CL 89* 90*  CO2 28 30  GLUCOSE 159* 125*  BUN 12 12  CREATININE 0.68 0.65  CALCIUM 8.7 8.5    PT/INR: No results found for this basename: LABPROT,INR in the last 72 hours ABG    Component Value Date/Time   PHART 7.498* 08/17/2012 0447   HCO3 28.1* 08/17/2012 0447   TCO2 29 08/17/2012 0447   O2SAT 99.0 08/17/2012 0447   CBG (last 3)   Basename 09/02/12 0510 09/01/12 2355 09/01/12 2015  GLUCAP 177* 193* 218*    Assessment/Plan: S/P Procedure(s) (LRB): LEFT HEART CATHETERIZATION WITH CORONARY ANGIOGRAM (N/A) TEMPORARY PACEMAKER INSERTION (Right) PERCUTANEOUS CORONARY STENT INTERVENTION (PCI-S) (Right)  1. Chylothorax- chest tube in place, talc pleurodesis done yesterday at  bedside, ouput decreased to 260cc yesterday, fluid continues to be cloudy, will leave chest tube in place for now 2. Management per primary team    LOS: 20 days    [name], [name] 09/02/2012   I have seen and examined the patient and agree with the assessment and plan as outlined.  Will not consider chest tube removal until tube is completely dry.  [name],[name] 09/02/2012 11:36 AM

## 2012-09-02 NOTE — Progress Notes (Signed)
Subjective:  Sore after pleurodesis  Objective:  Vital Signs in the last 24 hours: Temp:  [98.2 F (36.8 C)-98.7 F (37.1 C)] 98.7 F (37.1 C) (10/12 0509) Pulse Rate:  [67-79] 78  (10/12 0509) Resp:  [18] 18  (10/11 1410) BP: (96-144)/(55-63) 101/63 mmHg (10/12 0509) SpO2:  [93 %-97 %] 93 % (10/12 0509) Weight:  [98.657 kg (217 lb 8 oz)] 98.657 kg (217 lb 8 oz) (10/12 0509)  Intake/Output from previous day:  Intake/Output Summary (Last 24 hours) at 09/02/12 0854 Last data filed at 09/02/12 0821  Gross per 24 hour  Intake   1467 ml  Output   2405 ml  Net   -938 ml    Physical Exam: General appearance: alert, cooperative and no distress Lungs: chest tube on Rt Heart: regular rate and rhythm   Rate: 75  Rhythm: normal sinus rhythm and 6 bts NSVT yesterday  Lab Results:  Basename 09/02/12 0510 09/01/12 0500  WBC 20.4* 9.5  HGB 14.2 13.4  PLT 395 387    Basename 09/02/12 0510 09/01/12 0500  NA 125* 127*  K 5.0 4.1  CL 89* 90*  CO2 28 30  GLUCOSE 159* 125*  BUN 12 12  CREATININE 0.68 0.65   No results found for this basename: TROPONINI:2,CK,MB:2 in the last 72 hours Hepatic Function Panel  Basename 09/02/12 0510  PROT 6.2  ALBUMIN 2.4*  AST 14  ALT 9  ALKPHOS 57  BILITOT 0.4  BILIDIR --  IBILI --   No results found for this basename: CHOL in the last 72 hours No results found for this basename: INR in the last 72 hours  Imaging: Imaging results have been reviewed  Cardiac Studies:  Assessment/Plan:   Principal Problem:  *Sustained ventricular tachycardia with associated syncope; Monomorphic Active Problems:  CAD (coronary artery disease) 3 vessel disease  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  Pleural effusion, chest tube place 08/16/12, talc pleurodesis 09/01/12  Hx of cancer of lung, 3 years ago treated with radiation and chemo   Cardiomyopathy, ischemic, by cardiac cath 30-35% 08/13/12   S/P coronary artery stent  placement, acutely to RCA for acute Post. 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    Corine Shelter PA-C 09/02/2012, 8:54 AM   Patient seen and examined. Agree with assessment and plan. 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 Mild pleuritic chest discomfort, s/p talc pleurodesis. Still drained 260 cc from pleural tube last night. Short burst of NSVT, will titrate beta blocker, he is getting amiodarone at 400 mg bid.   Lennette Bihari, MD, [facility] 09/02/2012 9:00 AM

## 2012-09-02 NOTE — Progress Notes (Signed)
CARDIAC REHAB PHASE I   PRE:  Rate/Rhythm: 72 SR  BP:  Supine:   Sitting: 98/60  Standing:    SaO2: 98% RA  MODE:  Ambulation: 550 ft   POST:  Rate/Rhythem: 75  BP:  Supine:   Sitting: 112/64  Standing:    SaO2: 98% RA  1249-1313  Pt tolerated ambulation well with assist x1. Gait steady, VSS, no c/o. To chair after walk. Annetta Maw

## 2012-09-02 NOTE — Progress Notes (Signed)
Subjective: Pt stable overnight with no new complaints.  No increased wob.  Chest tube still draining.  10/12> He declined to walk w/ PT yest.   Objective:  Intake/Output from previous day: 10/11 0701 - 10/12 0700 In: 1707 [P.O.:960; TPN:747] Out: 2330 [Urine:2070; Chest Tube:260]   Physical Exam: Vital signs in last 24 hours:  BP 101/63  Pulse 78  Temp 98.7 F (37.1 C) (Oral)  Resp 18  Ht 6' (1.829 m)  Wt 98.657 kg (217 lb 8 oz)  BMI 29.50 kg/m2  SpO2 93%  WD male in NAD Nose without purulence or discharge noted. Chest with crackles on right. Rt chest tube with cloudy amber drainage. 170 cc over 24 hours. Cor with rrr abd benign LE without significant edema, no cyanosis Alert and oriented, moves all 4.   Lab Results:  Basename 09/02/12 0510 09/01/12 0500 08/31/12 0600  WBC 20.4* 9.5 11.6*  HGB 14.2 13.4 13.6  HCT 40.0 38.4* 39.2  PLT 395 387 428*   BMET  Basename 09/02/12 0510 09/01/12 0500 08/31/12 0600  NA 125* 127* 128*  K 5.0 4.1 4.0  CL 89* 90* 91*  CO2 28 30 29   GLUCOSE 159* 125* 136*  BUN 12 12 13   CREATININE 0.68 0.65 0.69  CALCIUM 8.7 8.5 8.5    Studies/Results: Dg Chest Port 1 View  09/01/2012  *RADIOLOGY REPORT*  Clinical Data: Right pneumothorax and chest tube  PORTABLE CHEST - 1 VIEW  Comparison: Prior chest x-ray 08/28/2012, CT chest 08/29/2012  Findings: Unchanged position of right thoracostomy tube and left upper extremity approach PICC.  The PICC catheter tip projects over the mid superior vena cava.  Trace residual right apical pneumothorax has improved since the prior study.  Unchanged mild cardiomegaly.  Ground-glass opacity in the right perihilar and medial upper lobe remain unchanged compared to prior.  IMPRESSION:  1. Trace residual right apical pneumothorax 2.  Stable satisfactory support apparatus   Original Report Authenticated By: Sterling Big, M.D.    Principal Problem:  *Sustained ventricular tachycardia with associated  syncope; Monomorphic Active Problems:  Hx of cancer of lung, 3 years ago treated with radiation and chemo   GERD (gastroesophageal reflux disease)  Hyperglycemia, pt has been on meds for diabetes in the past  Cardiomyopathy, ischemic, by cardiac cath 30-35% 08/13/12   CAD (coronary artery disease) 3 vessel disease  S/P coronary artery stent placement, acutely to RCA for acute Post. 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 Continue fat modified diet with medium chain triglycerides per nutrition recommendation and follow up on chest tube drainage and pleural fluid triglycerides in 2-3 days after diet initiation; Appreciate the help; with diet change we will monitor fluid characteristics; if no change in effusion would d/c chest tube; if reaccumulation/recurrent chylothorax after initiating diet would recommend talc pleurodesis before proceeding to chylous duct ligation/embolization. CT surgery following.  Will continue with conservative therapy, and hope this resolves.   10/9 repeat plural fluid triglycerides 10/10 increased drainage from chest tube(360 cc) and cloudy.  10/11 170 ccTalc pleurodesis planned per  per CVTS-  see note. 10/12 s/p talc pleurodesis per CVTS  >Given h/o cancer there is a concern that the chylothorax is related to recurrent malignancy; I discussed with the patient the possibility of obtaining a PET. He refused to have a PET now or in the future. He would like to follow up in regards to his cancer with his primary oncologist.   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  CT Scans here reviewed w/ ?regarding a hi left paratrach LN; We do not have any of the [place] records, but a PET scan is clearly indicated (despite his refusal)...   Lonzo Cloud. Kriste Basque, MD 09/02/2012, 7:41 AM

## 2012-09-02 NOTE — Progress Notes (Signed)
PARENTERAL NUTRITION CONSULT NOTE - FOLLOW UP  Pharmacy Consult: TPN Indication: Chylothorax  No Known Allergies  Patient Measurements: Height: 6' (182.9 cm) Weight: 217 lb 8 oz (98.657 kg) IBW/kg (Calculated) : 77.6  Adjusted Body Weight: 83.9 kg Usual Weight: 98.6 kg  Vital Signs: Temp: 98.7 F (37.1 C) (10/12 0509) Temp src: Oral (10/12 0509) BP: 101/63 mmHg (10/12 0509) Pulse Rate: 78  (10/12 0509) Intake/Output from previous day: 10/11 0701 - 10/12 0700 In: 1707 [P.O.:960; TPN:747] Out: 2330 [Urine:2070; Chest Tube:260]  Labs:  Basename 09/02/12 0510 09/01/12 0500 08/31/12 0600  WBC 20.4* 9.5 11.6*  HGB 14.2 13.4 13.6  HCT 40.0 38.4* 39.2  PLT 395 387 428*  APTT -- -- --  INR -- -- --     Basename 09/02/12 0510 09/01/12 0500 08/31/12 0600  NA 125* 127* 128*  K 5.0 4.1 4.0  CL 89* 90* 91*  CO2 28 30 29   GLUCOSE 159* 125* 136*  BUN 12 12 13   CREATININE 0.68 0.65 0.69  LABCREA -- -- --  CREAT24HRUR -- -- --  CALCIUM 8.7 8.5 8.5  MG -- -- 2.2  PHOS -- -- 3.3  PROT 6.2 5.9* 6.0  ALBUMIN 2.4* 2.5* 2.5*  AST 14 17 17   ALT 9 12 11   ALKPHOS 57 57 60  BILITOT 0.4 0.4 0.3  BILIDIR -- -- --  IBILI -- -- --  PREALBUMIN -- -- --  TRIG -- -- --  CHOLHDL -- -- --  CHOL -- -- --   Estimated Creatinine Clearance: 128.4 ml/min (by C-G formula based on Cr of 0.68).    Basename 09/02/12 0510 09/01/12 2355 09/01/12 2015  GLUCAP 177* 193* 218*   Insulin Requirements in the past 24 hours:  11 units on moderate SSI TIDac   Current Nutrition:  TPN + clear liquid diet + vital 1.0 cal liquid BID  Nutritional Goals:  2100-2300 kcal and 100-120 protein  Assessment:  [name] [name] admitted 9/22 with syncope and sustained VT with STEMI, emergent cath/PCI, stent, temp. pacer. Developed respiratory distress with VDRF, found to have PNA and moderate right pleural effusion on chest CT.  Found to have 300 cc chylous pleural fluid out of chest tube in 12h on 08/24/12. Diet  changed to NPO and orders for TNA per pharmacy.     GI: intact, TPN to decrease chyle- pt with good intake of clears (100% documented) and Vital 1.0 liquid BID. CT O/P decreasing s/p talc pleurodesis. Continues on PO multivitamin, PO PPI. Per RD, patient is eating breads with clear liquid diet, so if likely getting more calories than a std clear liquid diet. Endo: TSH elevated, T4 WNL, T3 slightly low.  no hx DM - CBGs have trended up with liberalized PO intake. Lytes: Chronic hyponatremia;  Cl also slightly low, on scheduled mag oxide 400 mg BID and  KCL 30 mEq BID. Other lytes remain WNL- concerning K trend upward (no hemolysis reported). Renal: SCr stable, good UOP at  0.9 ml/kg/hr, NS at [facility] Hepatobil: LFTs / TG WNL. Prealbumin down 18.7 but still WNL- suspect down trend d/t ongoing inflammation. Pulm: stable on RA - on Xopenex. Noted plans for talc pleurodesis today. Neuro: WNL, irritable. Cards: hx systolic CHF (EF ~16%). STEMI s/p PCI on amiodarone, ASA 81mg , lisinopril, Magox, metoprolol, KCL, Brilinta -- BP remains soft, HR ok - amio increased BID due to vtach 10/7 AM, none since 10/8 AM; need ICD but not indicated if has recurrent malignancy, cannot r/o since pt  refused PET scan, Life-Vest in the meantime  ID: s/p Ancef for MSSA in sputum 9/24; afebrile, WBC trending up Heme/Onc: hx small cell lung cancer; no clear signs of recurrent cancer per chest CT. Pt refused PET scan - H/H WNL, thrombocytosis Best Practices: PO PPI, SCDs TPN access: PICC TPN days: 10  Plan:  - Change Clinimix E to 5/15 to decrease caloric load, and change rate to 55 ml/hr to provide ~60% goal protein and ~50% of minimal kCal needs given good PO intake. - Provide lipids at 10 ml/hr MWF only due to national shortage  - Continue PO multivitamin daily (no MVI / trace elements in TPN) - F/U PO intake, CT output and wean TPN as appropriate - F/up CBGs and consider adding Lantus to his regimen if needed- will monoitor  closely given TPN tonight will have significantly lower dextrose load. - Will f/up labs on Monday - MD Please consider holding PO KCL supplementation.  Thanks, Damoni Causby K. Allena Katz, PharmD, BCPS.  Clinical Pharmacist Pager [phone]. 09/02/2012 7:09 AM

## 2012-09-03 DIAGNOSIS — I2510 Atherosclerotic heart disease of native coronary artery without angina pectoris: Secondary | ICD-10-CM

## 2012-09-03 LAB — COMPREHENSIVE METABOLIC PANEL
AST: 10 U/L (ref 0–37)
Albumin: 2.3 g/dL — ABNORMAL LOW (ref 3.5–5.2)
Alkaline Phosphatase: 55 U/L (ref 39–117)
BUN: 13 mg/dL (ref 6–23)
Potassium: 4.2 mEq/L (ref 3.5–5.1)
Sodium: 129 mEq/L — ABNORMAL LOW (ref 135–145)
Total Protein: 6 g/dL (ref 6.0–8.3)

## 2012-09-03 LAB — CBC WITH DIFFERENTIAL/PLATELET
Basophils Absolute: 0.1 10*3/uL (ref 0.0–0.1)
Basophils Relative: 1 % (ref 0–1)
Eosinophils Absolute: 0.5 10*3/uL (ref 0.0–0.7)
MCH: 30.9 pg (ref 26.0–34.0)
MCHC: 34.9 g/dL (ref 30.0–36.0)
Monocytes Relative: 14 % — ABNORMAL HIGH (ref 3–12)
Neutrophils Relative %: 76 % (ref 43–77)
Platelets: 315 10*3/uL (ref 150–400)
RDW: 13.3 % (ref 11.5–15.5)

## 2012-09-03 LAB — GLUCOSE, CAPILLARY
Glucose-Capillary: 117 mg/dL — ABNORMAL HIGH (ref 70–99)
Glucose-Capillary: 132 mg/dL — ABNORMAL HIGH (ref 70–99)
Glucose-Capillary: 136 mg/dL — ABNORMAL HIGH (ref 70–99)

## 2012-09-03 MED ORDER — CLINIMIX E/DEXTROSE (5/15) 5 % IV SOLN
INTRAVENOUS | Status: AC
  Filled 2012-09-03: qty 2000

## 2012-09-03 NOTE — Progress Notes (Signed)
Subjective: Pt stable overnight with no new complaints.  No increased wob.  Chest tube still draining.  10/12> He declined to walk w/ PT yest. 10/13> Drainage from CT is less, CVTS following...   Objective:  Intake/Output from previous day: 10/12 0701 - 10/13 0700 In: 2098.4 [I.V.:20; WUJ:8119.1] Out: 1995 [Urine:1925; Chest Tube:70]   Physical Exam: Vital signs in last 24 hours:  BP 103/61  Pulse 73  Temp 98.5 F (36.9 C) (Oral)  Resp 18  Ht 6' (1.829 m)  Wt 97.115 kg (214 lb 1.6 oz)  BMI 29.04 kg/m2  SpO2 92%  WD male in NAD Nose without purulence or discharge noted. Chest with crackles on right. Rt chest tube with cloudy amber drainage. 70 cc over last 24 hours. Cor with rrr abd benign LE without significant edema, no cyanosis Alert and oriented, moves all 4.   Lab Results:  Basename 09/03/12 0530 09/02/12 0510 09/01/12 0500  WBC 13.3* 20.4* 9.5  HGB 12.9* 14.2 13.4  HCT 37.0* 40.0 38.4*  PLT 315 395 387   BMET  Basename 09/03/12 0530 09/02/12 0510 09/01/12 0500  NA 129* 125* 127*  K 4.2 5.0 4.1  CL 91* 89* 90*  CO2 31 28 30   GLUCOSE 130* 159* 125*  BUN 13 12 12   CREATININE 0.63 0.68 0.65  CALCIUM 8.5 8.7 8.5    Studies/Results: No results found. Principal Problem:  *Sustained ventricular tachycardia with associated syncope; Monomorphic Active Problems:  Hx of cancer of lung, 3 years ago treated with radiation and chemo   GERD (gastroesophageal reflux disease)  Hyperglycemia, pt has been on meds for diabetes in the past  Cardiomyopathy, ischemic, by cardiac cath 30-35% 08/13/12   CAD (coronary artery disease) 3 vessel disease  S/P coronary artery stent placement, acutely to RCA for acute Post. 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 Continue fat modified diet with medium chain triglycerides per nutrition recommendation and follow up on chest tube drainage and pleural fluid triglycerides in 2-3 days after diet initiation- with diet change we will monitor fluid characteristics; if no change in effusion would d/c chest tube; if reaccumulation/recurrent chylothorax after initiating diet would recommend talc pleurodesis before proceeding to chylous duct ligation/embolization. CT surgery following.  Will continue with conservative therapy, and hope this resolves.   10/9 repeat plural fluid triglycerides 10/10 increased drainage from chest tube(360 cc) and cloudy.  10/11 170 ccTalc pleurodesis planned per  per CVTS- see note. 10/12 s/p talc pleurodesis per CVTS  >Given h/o cancer there is a concern that the chylothorax is related to recurrent malignancy; I discussed with the patient the possibility of obtaining a PET. He refused to have a PET now or in the future. He would like to follow up in regards to his cancer with his primary oncologist.   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  CT Scans here reviewed w/ ?regarding a hi left paratrach  LN; We do not have any of the [place] records & he needs f/u w/ Oncology ASAP...  On TNA: On clear liq diet & SSI Getting daily labs and BS 117-174 yest...   [name] [name]. Kriste Basque, MD 09/03/2012, 8:14 AM

## 2012-09-03 NOTE — Progress Notes (Signed)
Subjective:  Sore but no new complaints  Objective:  Vital Signs in the last 24 hours: Temp:  [97.3 F (36.3 C)-98.5 F (36.9 C)] 98.5 F (36.9 C) (10/13 0429) Pulse Rate:  [73-74] 74  (10/13 1028) Resp:  [18-20] 18  (10/13 0429) BP: (90-104)/(50-64) 90/56 mmHg (10/13 1028) SpO2:  [92 %-99 %] 92 % (10/13 0429) Weight:  [97.115 kg (214 lb 1.6 oz)] 97.115 kg (214 lb 1.6 oz) (10/13 0645)  Intake/Output from previous day:  Intake/Output Summary (Last 24 hours) at 09/03/12 1053 Last data filed at 09/03/12 0429  Gross per 24 hour  Intake 2088.44 ml  Output   1695 ml  Net 393.44 ml    Physical Exam: General appearance: alert, cooperative and no distress Lungs: decreased breath sounds on Rt, chest tube on RT Heart: regular rate and rhythm   Rate: 75  Rhythm: normal sinus rhythm  Lab Results:  Basename 09/03/12 0530 09/02/12 0510  WBC 13.3* 20.4*  HGB 12.9* 14.2  PLT 315 395    Basename 09/03/12 0530 09/02/12 0510  NA 129* 125*  K 4.2 5.0  CL 91* 89*  CO2 31 28  GLUCOSE 130* 159*  BUN 13 12  CREATININE 0.63 0.68   No results found for this basename: TROPONINI:2,CK,MB:2 in the last 72 hours Hepatic Function Panel  Basename 09/03/12 0530  PROT 6.0  ALBUMIN 2.3*  AST 10  ALT 7  ALKPHOS 55  BILITOT 0.6  BILIDIR --  IBILI --   No results found for this basename: CHOL in the last 72 hours No results found for this basename: INR in the last 72 hours  Imaging: Imaging results have been reviewed  Cardiac Studies:  Assessment/Plan:   Principal Problem:  *Sustained ventricular tachycardia with associated syncope; Monomorphic Active Problems:  CAD (coronary artery disease) 3 vessel disease  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  Pleural effusion, chest tube place 08/16/12, talc pleurodesis 09/01/12  Hx of cancer of lung, 3 years ago treated with radiation and chemo   Cardiomyopathy, ischemic, by cardiac cath 30-35% 08/13/12   S/P coronary  artery stent placement, acutely to RCA for acute Post. 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 Will discuss with MD.   Corine Shelter PA-C 09/03/2012, 10:53 AM   I have seen and examined the patient along with Corine Shelter PA-C.  I have reviewed the chart, notes and new data.  I agree with PA's note.  No further VT, no signs of acute HF.  PLAN: Defer diet decision to the surgery service.  Thurmon Fair, MD, [facility] [phone] 09/03/2012, 11:03 AM

## 2012-09-03 NOTE — Progress Notes (Signed)
PARENTERAL NUTRITION CONSULT NOTE - FOLLOW UP  Pharmacy Consult: TPN Indication: Chylothorax  No Known Allergies  Patient Measurements: Height: 6' (182.9 cm) Weight: 214 lb 1.6 oz (97.115 kg) IBW/kg (Calculated) : 77.6  Adjusted Body Weight: 83.9 kg Usual Weight: 98.6 kg  Vital Signs: Temp: 98.5 F (36.9 C) (10/13 0429) Temp src: Oral (10/13 0429) BP: 103/61 mmHg (10/13 0429) Pulse Rate: 73  (10/13 0429) Intake/Output from previous day: 10/12 0701 - 10/13 0700 In: 2098.4 [I.V.:20; ZOX:0960.4] Out: 1995 [Urine:1925; Chest Tube:70]  Labs:  [facility] 09/03/12 0530 09/02/12 0510 09/01/12 0500  WBC 13.3* 20.4* 9.5  HGB 12.9* 14.2 13.4  HCT 37.0* 40.0 38.4*  PLT 315 395 387  APTT -- -- --  INR -- -- --     Basename 09/03/12 0530 09/02/12 0510 09/01/12 0500  NA 129* 125* 127*  K 4.2 5.0 4.1  CL 91* 89* 90*  CO2 31 28 30   GLUCOSE 130* 159* 125*  BUN 13 12 12   CREATININE 0.63 0.68 0.65  LABCREA -- -- --  CREAT24HRUR -- -- --  CALCIUM 8.5 8.7 8.5  MG -- -- --  PHOS -- -- --  PROT 6.0 6.2 5.9*  ALBUMIN 2.3* 2.4* 2.5*  AST 10 14 17   ALT 7 9 12   ALKPHOS 55 57 57  BILITOT 0.6 0.4 0.4  BILIDIR -- -- --  IBILI -- -- --  PREALBUMIN -- -- --  TRIG -- -- --  CHOLHDL -- -- --  CHOL -- -- --   Estimated Creatinine Clearance: 127.5 ml/min (by C-G formula based on Cr of 0.63).    Basename 09/03/12 0426 09/02/12 2336 09/02/12 1959  GLUCAP 120* 136* 117*   Insulin Requirements in the past 24 hours:  3 units on moderate SSI TIDac   Current Nutrition:  TPN + clear liquid diet + vital 1.0 cal liquid BID  Nutritional Goals:  2100-2300 kcal and 100-120 protein  Assessment:  [name] [name] admitted 9/22 with syncope and sustained VT with STEMI, emergent cath/PCI, stent, temp. pacer. Developed respiratory distress with VDRF, found to have PNA and moderate right pleural effusion on chest CT.  Found to have 300 cc chylous pleural fluid out of chest tube in 12h on 08/24/12. Diet  changed to NPO and orders for TNA per pharmacy.     GI: intact, good intake of clears (100% documented) and Vital 1.0 liquid BID. CT O/P decreasing, now down to 70cc s/p talc pleurodesis. Continues on PO multivitamin, PO PPI. Per RD, patient is eating breads with clear liquid diet, so if likely getting more calories than a std clear liquid diet. Endo: TSH elevated, T4 WNL, T3 slightly low.  no hx DM - CBGs controlled with minimal SSI use with decreased dextrose load in TPN. Lytes: Chronic hyponatremia;  Cl also slightly low, on scheduled mag oxide 400 mg BID and  KCL 30 mEq BID. K normalized, no other electrolyte issues. Renal: SCr stable, good UOP at  0.9 ml/kg/hr, NS at [facility] Hepatobil: LFTs / TG WNL. Prealbumin down 18.7 but still WNL- suspect down trend d/t ongoing inflammation. Pulm: stable on RA - on Xopenex. Noted plans for talc pleurodesis today. Neuro: WNL, irritable. Cards: hx systolic CHF (EF ~[name]%). STEMI s/p PCI on amiodarone, ASA 81mg , lisinopril, Magox, metoprolol, KCL, Brilinta -- BP remains soft, HR ok - amio increased BID due to vtach 10/7 AM, none since 10/8 AM; need ICD but not indicated if has recurrent malignancy, cannot r/o since pt refused PET scan,  Life-Vest in the meantime  ID: s/p Ancef for MSSA in sputum 9/24; afebrile, WBC trending up Heme/Onc: hx small cell lung cancer; no clear signs of recurrent cancer per chest CT. Pt refused PET scan - H/H WNL, thrombocytosis Best Practices: PO PPI, SCDs TPN access: PICC TPN days: 10  Plan:  - Continue Clinimix E 5/15 at 55 ml/hr to provide ~60% goal protein and ~50% of minimal kCal needs given good PO intake per RD recs. - Provide lipids at 10 ml/hr MWF only due to national shortage  - Continue PO multivitamin daily (no MVI / trace elements in TPN) - Will f/U PO intake, CT output and wean TPN as appropriate, noted plans to remove CT if output nil. - F/up CBGs and consider adding Lantus to his regimen if needed- will monoitor  closely given TPN tonight will have significantly lower dextrose load. - Will f/up AM labs  Thanks, Debany Vantol K. Allena Katz, PharmD, BCPS.  Clinical Pharmacist Pager [phone]. 09/03/2012 6:59 AM

## 2012-09-03 NOTE — Progress Notes (Addendum)
21 Days Post-Op Procedure(s) (LRB): LEFT HEART CATHETERIZATION WITH CORONARY ANGIOGRAM (N/A) TEMPORARY PACEMAKER INSERTION (Right) PERCUTANEOUS CORONARY STENT INTERVENTION (PCI-S) (Right)  Subjective:  Mr. Dembinski has no complaints this morning.    Objective: Vital signs in last 24 hours: Temp:  [97.3 F (36.3 C)-98.5 F (36.9 C)] 98.5 F (36.9 C) (10/13 0429) Pulse Rate:  [73] 73  (10/13 0429) Cardiac Rhythm:  [-] Normal sinus rhythm (10/12 2055) Resp:  [18-20] 18  (10/13 0429) BP: (99-104)/(50-64) 103/61 mmHg (10/13 0429) SpO2:  [92 %-99 %] 92 % (10/13 0429) Weight:  [214 lb 1.6 oz (97.115 kg)] 214 lb 1.6 oz (97.115 kg) (10/13 0645)  Intake/Output from previous day: 10/12 0701 - 10/13 0700 In: 2098.4 [I.V.:20; ZOX:0960.4] Out: 1995 [Urine:1925; Chest Tube:70]  General appearance: alert, cooperative and no distress Heart: regular rate and rhythm Lungs: clear to auscultation bilaterally Abdomen: soft, non-tender; bowel sounds normal; no masses,  no organomegaly Wound: clean and dry  Lab Results:  [facility] 09/03/12 0530 09/02/12 0510  WBC 13.3* 20.4*  HGB 12.9* 14.2  HCT 37.0* 40.0  PLT 315 395   BMET:  Basename 09/03/12 0530 09/02/12 0510  NA 129* 125*  K 4.2 5.0  CL 91* 89*  CO2 31 28  GLUCOSE 130* 159*  BUN 13 12  CREATININE 0.63 0.68  CALCIUM 8.5 8.7    PT/INR: No results found for this basename: LABPROT,INR in the last 72 hours ABG    Component Value Date/Time   PHART 7.498* 08/17/2012 0447   HCO3 28.1* 08/17/2012 0447   TCO2 29 08/17/2012 0447   O2SAT 99.0 08/17/2012 0447   CBG (last 3)   Basename 09/03/12 0426 09/02/12 2336 09/02/12 1959  GLUCAP 120* 136* 117*    Assessment/Plan: S/P Procedure(s) (LRB): LEFT HEART CATHETERIZATION WITH CORONARY ANGIOGRAM (N/A) TEMPORARY PACEMAKER INSERTION (Right) PERCUTANEOUS CORONARY STENT INTERVENTION (PCI-S) (Right)  1. Chylothorax- chest tube in place, output has significantly decreased only 70cc  yesterday, remains cloudy- will leave chest tube in place until no drainage present also patient remains on liquid diet and TPN 2. Management per primary team   LOS: 21 days    BARRETT, ERIN 09/03/2012   I have seen and examined the patient and agree with the assessment and plan as outlined.  OWEN,CLARENCE H 09/03/2012 11:05 AM

## 2012-09-04 ENCOUNTER — Inpatient Hospital Stay (HOSPITAL_COMMUNITY): Payer: BC Managed Care – PPO

## 2012-09-04 LAB — CBC WITH DIFFERENTIAL/PLATELET
Eosinophils Absolute: 0.6 10*3/uL (ref 0.0–0.7)
Eosinophils Relative: 7 % — ABNORMAL HIGH (ref 0–5)
HCT: 35.3 % — ABNORMAL LOW (ref 39.0–52.0)
Hemoglobin: 12.2 g/dL — ABNORMAL LOW (ref 13.0–17.0)
Lymphocytes Relative: 12 % (ref 12–46)
Lymphs Abs: 0.9 10*3/uL (ref 0.7–4.0)
MCH: 30.4 pg (ref 26.0–34.0)
MCV: 88 fL (ref 78.0–100.0)
Monocytes Relative: 16 % — ABNORMAL HIGH (ref 3–12)
RBC: 4.01 MIL/uL — ABNORMAL LOW (ref 4.22–5.81)

## 2012-09-04 LAB — COMPREHENSIVE METABOLIC PANEL
Alkaline Phosphatase: 59 U/L (ref 39–117)
BUN: 13 mg/dL (ref 6–23)
CO2: 33 mEq/L — ABNORMAL HIGH (ref 19–32)
Calcium: 8.3 mg/dL — ABNORMAL LOW (ref 8.4–10.5)
GFR calc Af Amer: >90 mL/min (ref >90)
GFR calc non Af Amer: >90 mL/min (ref >90)
Glucose, Bld: 111 mg/dL — ABNORMAL HIGH (ref 70–99)
Total Protein: 6 g/dL (ref 6.0–8.3)

## 2012-09-04 LAB — GLUCOSE, CAPILLARY
Glucose-Capillary: 103 mg/dL — ABNORMAL HIGH (ref 70–99)
Glucose-Capillary: 121 mg/dL — ABNORMAL HIGH (ref 70–99)

## 2012-09-04 LAB — MAGNESIUM: Magnesium: 2.2 mg/dL (ref 1.5–2.5)

## 2012-09-04 MED ORDER — FAT EMULSION 20 % IV EMUL
240.0000 mL | INTRAVENOUS | Status: AC
  Administered 2012-09-04: 240 mL via INTRAVENOUS
  Filled 2012-09-04: qty 250

## 2012-09-04 MED ORDER — FUROSEMIDE 10 MG/ML IJ SOLN: 40.0000 mg | Freq: Every day | INTRAMUSCULAR | Status: DC

## 2012-09-04 MED ORDER — CLINIMIX E/DEXTROSE (5/15) 5 % IV SOLN
INTRAVENOUS | Status: AC
  Administered 2012-09-04: 18:00:00 via INTRAVENOUS
  Filled 2012-09-04: qty 2000

## 2012-09-04 NOTE — Progress Notes (Addendum)
                    [address]            [place] [address]          [phone]     22 Days Post-Op Procedure(s) (LRB): LEFT HEART CATHETERIZATION WITH CORONARY ANGIOGRAM (N/A) TEMPORARY PACEMAKER INSERTION (Right) PERCUTANEOUS CORONARY STENT INTERVENTION (PCI-S) (Right)  Subjective: Stable night, sore at CT site, no other complaints.   Objective: Vital signs in last 24 hours: Patient Vitals for the past 24 hrs:  BP Temp Temp src Pulse Resp SpO2 Weight  09/04/12 0526 95/56 mmHg 97.4 F (36.3 C) Oral 67  19  95 % 215 lb 14.4 oz (97.932 kg)  09/03/12 2010 98/60 mmHg 97.6 F (36.4 C) Oral 75  19  96 % -  09/03/12 1353 114/58 mmHg 97.5 F (36.4 C) Oral 77  18  98 % -  09/03/12 1028 90/56 mmHg - - 74  - - -   Current Weight  09/04/12 215 lb 14.4 oz (97.932 kg)     Intake/Output from previous day: 10/13 0701 - 10/14 0700 In: 1450 [P.O.:1440; I.V.:10] Out: 1390 [Urine:1350; Chest Tube:40]    PHYSICAL EXAM:  Heart: RRR Lungs: Clear L, slightly decreased in R base Chest tube: minimal output, no air leak    Lab Results: CBC: Basename 09/04/12 0500 09/03/12 0530  WBC 7.9 13.3*  HGB 12.2* 12.9*  HCT 35.3* 37.0*  PLT 299 315   BMET:  Basename 09/04/12 0500 09/03/12 0530  NA 128* 129*  K 3.7 4.2  CL 89* 91*  CO2 33* 31  GLUCOSE 111* 130*  BUN 13 13  CREATININE 0.62 0.63  CALCIUM 8.3* 8.5    PT/INR: No results found for this basename: LABPROT,INR in the last 72 hours  CXR: IMPRESSION:  New right lung volume loss with associated development of a right  pleural effusion and increased interstitial markings possibly  related to atelectasis but with a unilateral pulmonary edema  pattern a secondary consideration.  Malpositioned left subclavian CVP with the tip directed cephalad in  the superior vena cava and needing to be withdrawn 4.8 cm to allow  repositioning in the caudad direction in the superior vena cava     Assessment/Plan: S/P  Procedure(s) (LRB): LEFT HEART CATHETERIZATION WITH CORONARY ANGIOGRAM (N/A) TEMPORARY PACEMAKER INSERTION (Right) PERCUTANEOUS CORONARY STENT INTERVENTION (PCI-S) (Right) Chylothorax- CT output slowing down.  Continue to water seal.  Continue TNA, liquids for now. Other issues per Cardiology.   LOS: 22 days    COLLINS,GINA H 09/04/2012    Chart reviewed, patient examined, agree with above. Chest tube output has decreased to 70 cc and 40 cc the past two days after talc. CXR looks ok with right pleural reaction causing haziness on cxr as expected. I would continue current diet for another day and if chest tube output remains low, then start fat modified diet. Keep chest tube in until diet advanced. Discussed plans with patient.

## 2012-09-04 NOTE — Progress Notes (Signed)
PARENTERAL NUTRITION CONSULT NOTE - FOLLOW UP  Pharmacy Consult: TPN Indication: Chylothorax  No Known Allergies  Patient Measurements: Height: 6' (182.9 cm) Weight: 215 lb 14.4 oz (97.932 kg) IBW/kg (Calculated) : 77.6  Adjusted Body Weight: 83.9 kg Usual Weight: 98.6 kg  Vital Signs: Temp: 97.4 F (36.3 C) (10/14 0526) Temp src: Oral (10/14 0526) BP: 95/56 mmHg (10/14 0526) Pulse Rate: 67  (10/14 0526) Intake/Output from previous day: 10/13 0701 - 10/14 0700 In: 1450 [P.O.:1440; I.V.:10] Out: 1390 [Urine:1350; Chest Tube:40]  Labs:  [facility] 09/04/12 0500 09/03/12 0530 09/02/12 0510  WBC 7.9 13.3* 20.4*  HGB 12.2* 12.9* 14.2  HCT 35.3* 37.0* 40.0  PLT 299 315 395  APTT -- -- --  INR -- -- --     Basename 09/04/12 0500 09/03/12 0530 09/02/12 0510  NA 128* 129* 125*  K 3.7 4.2 5.0  CL 89* 91* 89*  CO2 33* 31 28  GLUCOSE 111* 130* 159*  BUN 13 13 12   CREATININE 0.62 0.63 0.68  LABCREA -- -- --  CREAT24HRUR -- -- --  CALCIUM 8.3* 8.5 8.7  MG 2.2 -- --  PHOS 3.8 -- --  PROT 6.0 6.0 6.2  ALBUMIN 2.1* 2.3* 2.4*  AST 13 10 14   ALT 9 7 9   ALKPHOS 59 55 57  BILITOT 0.4 0.6 0.4  BILIDIR -- -- --  IBILI -- -- --  PREALBUMIN -- -- --  TRIG 70 -- --  CHOLHDL -- -- --  CHOL -- -- --   Estimated Creatinine Clearance: 128 ml/min (by C-G formula based on Cr of 0.62).    Basename 09/04/12 0752 09/04/12 0522 09/03/12 2346  GLUCAP 145* 103* 132*   Insulin Requirements in the past 24 hours:  2 units on moderate SSI TIDac   Current Nutrition:  TPN + clear liquid diet + vital 1.0 cal liquid BID  Nutritional Goals:  2100-2300 kcal and 100-120 protein  Assessment:  56 YOM admitted 9/22 with syncope and sustained VT with STEMI, emergent cath/PCI, stent, temp. pacer. Developed respiratory distress with VDRF, found to have PNA and moderate right pleural effusion on chest CT.  Found to have 300 cc chylous pleural fluid out of chest tube in 12h on 08/24/12. Diet  changed to NPO and orders for TNA per pharmacy.     AV:[id number] clear liquie diet and Vital 1.0 liquid BID. Continues on PO multivitamin, PO PPI. Per RD, patient is eating breads with clear liquid diet, so if likely getting more calories than a std clear liquid diet. Endo: TSH elevated, T4 WNL, T3 slightly low.  no hx DM - CBGs controlled with minimal SSI use with decreased dextrose load in TPN. Lytes: Chronic hyponatremia;  Cl also slightly low, on scheduled mag oxide 400 mg BID. Other lytes good. Renal: SCr stable, good UOP at  0.6 ml/kg/hr, NS at [facility] Hepatobil: LFTs / TG WNL. Prealbumin down 18.7 but still WNL- suspect down trend d/t ongoing inflammation. Pulm: stable on RA - on Xopenex. s/p talc pleurodesis.  Neuro: No issues Cards: hx systolic CHF (EF ~82%). STEMI s/p PCI on amiodarone, ASA 81mg , lisinopril, Magox, metoprolol, Brilinta -- BP 95/56, HR 67 - amio increased BID due to vtach 10/7 AM, none since 10/8 AM; need ICD but not indicated if has recurrent malignancy, cannot r/o since pt refused PET scan, Life-Vest in the meantime  ID: s/p Ancef for MSSA in sputum 9/24; afebrile, WBC trending up Heme/Onc: hx small cell lung cancer; no clear signs of  recurrent cancer per chest CT. Pt refused PET scan - CBC stable Best Practices: PO PPI, SCDs TPN access: PICC TPN days: 11  Plan:  - Continue Clinimix E 5/15 at 55 ml/hr to provide ~60% goal protein and ~50% of minimal kCal needs given good PO intake per RD recs. (noted plans for advancing diet likely tomorrow) - Provide lipids at 10 ml/hr MWF only due to national shortage  - Continue PO multivitamin daily (no MVI / trace elements in TPN) - f/u diet advancement and toleration - f/u AM BMET  Lysle Pearl, PharmD, BCPS Pager # [phone] 09/04/2012 9:48 AM

## 2012-09-04 NOTE — Progress Notes (Signed)
Subjective: No new c/o.  CT drainage slowing.  Denies chest pain, sob.   Objective:  Intake/Output from previous day: 10/13 0701 - 10/14 0700 In: 1450 [P.O.:1440; I.V.:10] Out: 1390 [Urine:1350; Chest Tube:40]   Physical Exam: Vital signs in last 24 hours:  BP 95/56  Pulse 67  Temp 97.4 F (36.3 C) (Oral)  Resp 19  Ht 6' (1.829 m)  Wt 215 lb 14.4 oz (97.932 kg)  BMI 29.28 kg/m2  SpO2 95%  General: WD male in NAD, OOB in chair  Neuro: awake, alert, appropriate CV: s1s2 rrr PULM: resps even non labored, Chest with crackles on right. Rt chest tube with cloudy amber drainage. GI: abd soft, +bs Extremities: warm and dry, no edema    Lab Results:  Basename 09/04/12 0500 09/03/12 0530 09/02/12 0510  WBC 7.9 13.3* 20.4*  HGB 12.2* 12.9* 14.2  HCT 35.3* 37.0* 40.0  PLT 299 315 395   BMET  Basename 09/04/12 0500 09/03/12 0530 09/02/12 0510  NA 128* 129* 125*  K 3.7 4.2 5.0  CL 89* 91* 89*  CO2 33* 31 28  GLUCOSE 111* 130* 159*  BUN 13 13 12   CREATININE 0.62 0.63 0.68  CALCIUM 8.3* 8.5 8.7    Studies/Results: Dg Chest Port 1 View  09/04/2012  *RADIOLOGY REPORT*  Clinical Data: Chest tube in place  PORTABLE CHEST - 1 VIEW  Comparison: 09/01/2012  Findings: The left subclavian CVP tip has flipped and is now directed cephalad in the superior vena cava.  This needs to be withdrawn 4.8 cm to allow repositioning into the distal portion of the superior vena cava.  A right-sided chest tube is unchanged in position.  There has been an interval decrease in right lung volume with some associated shift to the right.  No residual pneumothorax is seen but a new right pleural effusion is noted. Some increase in interstitial markings throughout the right lung field may be related to diffuse volume loss but a unilateral pulmonary edema pattern (acute interstitial disease) can give a similar appearance.  The left lung remains clear.  IMPRESSION: New right lung volume loss with associated  development of a right pleural effusion and increased interstitial markings possibly related to atelectasis but with a unilateral pulmonary edema pattern a secondary consideration.  Malpositioned left subclavian CVP with the tip directed cephalad in the superior vena cava and needing to be withdrawn 4.8 cm to allow repositioning in the caudad direction in the superior vena cava  These results will be called to the ordering clinician or representative by the Radiologist Assistant, and communication documented in the PACS Dashboard.   Original Report Authenticated By: Bertha Stakes, M.D.     Principal Problem:  *Sustained ventricular tachycardia with associated syncope; Monomorphic Active Problems:  Hx of cancer of lung, 3 years ago treated with radiation and chemo   GERD (gastroesophageal reflux disease)  Hyperglycemia, pt has been on meds for diabetes in the past  Cardiomyopathy, ischemic, by cardiac cath 30-35% 08/13/12   CAD (coronary artery disease) 3 vessel disease  S/P coronary artery stent placement, acutely to RCA for acute Post. 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 Continue fat modified diet with medium chain triglycerides per nutrition recommendation and follow up on chest tube drainage and pleural fluid triglycerides - with diet change we will monitor fluid characteristics.  S/p talc pleurodesis 10/11. CT surgery following.  CT output cont to slow.  CVTS will likely advance diet in next day or so if output remains low and monitor  output with advanced diet.     10/9 repeat plural fluid triglycerides 10/10 increased drainage from chest tube(360 cc) and cloudy.  10/11 170 ccTalc pleurodesis planned per  per CVTS- see note. 10/12 s/p talc pleurodesis per CVTS  >Given h/o cancer there is a concern that the chylothorax is related to recurrent malignancy; Discussed with the patient the possibility of obtaining a PET. He refused to have a PET now or in the future. He would like to follow up in regards to his cancer with his primary oncologist.   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  CT Scans here reviewed w/ ?regarding a hi left paratrach LN; We do not have any of the [place] records & he needs f/u w/ Oncology ASAP but refuses PET/onc f/u here.   On TNA: On clear liq diet & SSI Daily labs   Dispo -- cont pt/ot (although he refuses at times).  Once decisions made regarding CT will need outpt oncology follow up at [facility].    Danford Bad, NP 09/04/2012  9:21 AM Pager: [phone] or [phone]  *Care during the described time interval was provided by me and/or other providers on the critical care team. I have reviewed this patient's available data, including medical history, events of note, physical examination and test results as part of my evaluation.   Attending Addendum:  I have seen the patient, discussed the issues, test results and plans with K. Whiteheart, NP. I agree with the Assessment and Plans as ammended above.   Levy Pupa, MD, PhD 09/04/2012, 2:18 PM [facility] [phone] or if no answer [phone]

## 2012-09-04 NOTE — Progress Notes (Signed)
Per radiologist, PICC retracted 4cm using sterile technique. Will await results of chest Xray to confirm PICC tip placement

## 2012-09-04 NOTE — Progress Notes (Signed)
CARDIAC REHAB PHASE I   PRE:  Rate/Rhythm: 73 SR    BP: sitting 104/60    SaO2:   MODE:  Ambulation: 800 ft   POST:  Rate/Rhythm:     BP: sitting 140/66     SaO2:   C/o dizziness entire walk. Sts no change in intensity. X1 LOB, pt able to correct himself. Pt has not been walking on his own. Waits for [place] even though he has been told to walk more and can be independent. Again encouraged more walking. C/o boredom. Will continue to follow. [id number]  Harriet Masson CES, ACSM

## 2012-09-04 NOTE — Progress Notes (Signed)
[facility]  Subjective: No complaints.  Objective: Vital signs in last 24 hours: Temp:  [97.4 F (36.3 C)-97.6 F (36.4 C)] 97.4 F (36.3 C) (10/14 0526) Pulse Rate:  [67-77] 67  (10/14 0526) Resp:  [18-19] 19  (10/14 0526) BP: (90-114)/(56-60) 95/56 mmHg (10/14 0526) SpO2:  [95 %-98 %] 95 % (10/14 0526) Weight:  [97.932 kg (215 lb 14.4 oz)] 97.932 kg (215 lb 14.4 oz) (10/14 0526) Last BM Date: 09/02/12  Intake/Output from previous day: 10/13 0701 - 10/14 0700 In: 1450 [P.O.:1440; I.V.:10] Out: 1390 [Urine:1350; Chest Tube:40] Intake/Output this shift: Total I/O In: -  Out: 300 [Urine:300]  Medications Current Facility-Administered Medications  Medication Dose Route Frequency Provider Last Rate Last Dose  . 0.9 %  sodium chloride infusion   Intravenous Continuous Kalman Shan, MD 20 mL/hr at 08/24/12 0600    . acetaminophen (TYLENOL) tablet 650 mg  650 mg Oral Q4H PRN Lennette Bihari, MD   650 mg at 08/21/12 0752  . ALPRAZolam Prudy Feeler) tablet 0.25 mg  0.25 mg Oral TID PRN Abelino Derrick, PA      . amiodarone (PACERONE) tablet 400 mg  400 mg Oral BID Chrystie Nose, MD   400 mg at 09/03/12 2125  . aspirin EC tablet 81 mg  81 mg Oral Daily Lennette Bihari, MD   81 mg at 09/03/12 1027  . diphenhydrAMINE (BENADRYL) capsule 25 mg  25 mg Oral QHS PRN Storm Frisk, MD   25 mg at 08/25/12 0019  . fat emulsion 20 % infusion 240 mL  240 mL Intravenous Continuous TPN Drake Leach Rumbarger, PHARMD      . feeding supplement (VITAL 1.0 CAL) liquid 237 mL  237 mL Oral BID BM Mihai Croitoru, MD   237 mL at 09/03/12 1400  . insulin aspart (novoLOG) injection 0-15 Units  0-15 Units Subcutaneous Q4H [place], [place]   2 Units at 09/04/12 1610  . levalbuterol (XOPENEX) nebulizer solution 0.63 mg  0.63 mg Nebulization Q6H PRN Abelino Derrick, PA   0.63 mg at 08/15/12 1008  . lidocaine (LIDODERM) 5 % 1 patch  1 patch Transdermal Q24H Roxine Caddy, MD   1 patch at 09/02/12 0900  . lisinopril (PRINIVIL,ZESTRIL) tablet 2.5 mg  2.5 mg Oral Daily Eda Paschal [place], [place]   2.5 mg at 09/03/12 1028  . magnesium oxide (MAG-OX) tablet 400 mg  400 mg Per NG tube BID Thurmon Fair, MD   400 mg at 09/03/12 2125  . metoprolol tartrate (LOPRESSOR) tablet 25 mg  25 mg Oral BID Eda Paschal [place], [place]   25 mg at 09/03/12 2124  . multivitamin with minerals tablet 1 tablet  1 tablet Oral Daily Kalman Shan, MD   1 tablet at 09/03/12 1027  . ondansetron (ZOFRAN) injection 4 mg  4 mg Intravenous Q4H PRN Chrystie Nose, MD   4 mg at 09/01/12 2211  . oxyCODONE (Oxy IR/ROXICODONE) immediate release tablet 10 mg  10 mg Oral Q3H PRN Alleen Borne, MD   10 mg at 09/04/12 0706  . pantoprazole (PROTONIX) EC tablet 40 mg  40 mg Oral Q0600 Eda Paschal [place], [place]   40 mg at 09/04/12 0701  . sodium chloride 0.9 % injection 10-40 mL  10-40 mL Intracatheter Q12H Arley Phenix, MD   20 mL at 08/27/12 2200  . sodium chloride 0.9 % injection 10-40 mL  10-40 mL Intracatheter PRN Arley Phenix,  MD   10 mL at 09/04/12 0539  . Ticagrelor (BRILINTA) tablet 90 mg  90 mg Oral BID Lennette Bihari, MD   90 mg at 09/03/12 2125  . TPN (CLINIMIX) +/- additives   Intravenous Continuous TPN Christella Hartigan, PHARMD 55 mL/hr at 09/03/12 1739    . TPN (CLINIMIX) +/- additives   Intravenous Continuous TPN Christella Hartigan, PHARMD      . TPN [facility]) +/- additives   Intravenous Continuous TPN Drake Leach Rumbarger, PHARMD      . traMADol (ULTRAM) tablet 50 mg  50 mg Oral Q6H PRN Abelino Derrick, PA   50 mg at 08/29/12 1610  . zolpidem (AMBIEN) tablet 5 mg  5 mg Oral QHS PRN Abelino Derrick, PA      . DISCONTD: furosemide (LASIX) injection 40 mg  40 mg Intravenous Daily Wilburt Finlay, PA        PE: General appearance: alert, cooperative and no distress Lungs: Decreased BS on the Right Heart: regular rate and rhythm, S1, S2 normal, no murmur, click, rub or gallop Abdomen: +BS Extremities: No  LEE Pulses: 2+ and symmetric Skin: Skin color, texture, turgor normal. No rashes or lesions or Warm and dry Neurologic: Grossly normal  Lab Results:   Basename 09/04/12 0500 09/03/12 0530 09/02/12 0510  WBC 7.9 13.3* 20.4*  HGB 12.2* 12.9* 14.2  HCT 35.3* 37.0* 40.0  PLT 299 315 395   BMET  Basename 09/04/12 0500 09/03/12 0530 09/02/12 0510  NA 128* 129* 125*  K 3.7 4.2 5.0  CL 89* 91* 89*  CO2 33* 31 28  GLUCOSE 111* 130* 159*  BUN 13 13 12   CREATININE 0.62 0.63 0.68  CALCIUM 8.3* 8.5 8.7   Assessment/Plan  Principal Problem:  *Sustained ventricular tachycardia with associated syncope; Monomorphic Active Problems:  Hx of cancer of lung, 3 years ago treated with radiation and chemo   GERD (gastroesophageal reflux disease)  Hyperglycemia, pt has been on meds for diabetes in the past  Cardiomyopathy, ischemic, by cardiac cath 30-35% 08/13/12   CAD (coronary artery disease) 3 vessel disease  S/P coronary artery stent placement, acutely to RCA for acute Post. 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   lopressor at 25mg  BID.  No room with BP to titrate further.  Amio at 400mg .   BID.  Chest tube drainage 40ml in the last 24 hours.   Diet advancement per CTS.     LOS: 22 days    HAGER, BRYAN 09/04/2012 10:08 AM   Patient seen and examined. Agree with assessment and plan. Chest tube output is declining s/p talc pleurodesis. Low BP limits further titration of beta blocker for now. Will continue amiodarone at 400 mg bid for 1 more week, then reduce to 600 mg daily for 1 month and then 400 mg daily. Diet advancement  per Dr. Laneta Simmers.    Lennette Bihari, MD, [facility] 09/04/2012 10:23 AM

## 2012-09-04 NOTE — Progress Notes (Signed)
Left upper arm PICC found to be malpositioned. After site prepped with CHG, sterile drape applied and under sterile conditions PICC line was exchanged. Tol well.

## 2012-09-05 ENCOUNTER — Inpatient Hospital Stay (HOSPITAL_COMMUNITY): Payer: BC Managed Care – PPO

## 2012-09-05 DIAGNOSIS — I898 Other specified noninfective disorders of lymphatic vessels and lymph nodes: Secondary | ICD-10-CM

## 2012-09-05 LAB — CBC WITH DIFFERENTIAL/PLATELET
Eosinophils Absolute: 0.4 10*3/uL (ref 0.0–0.7)
Eosinophils Relative: 6 % — ABNORMAL HIGH (ref 0–5)
HCT: 35.6 % — ABNORMAL LOW (ref 39.0–52.0)
Hemoglobin: 12.2 g/dL — ABNORMAL LOW (ref 13.0–17.0)
Lymphocytes Relative: 10 % — ABNORMAL LOW (ref 12–46)
Lymphs Abs: 0.7 10*3/uL (ref 0.7–4.0)
MCH: 30.1 pg (ref 26.0–34.0)
MCV: 87.9 fL (ref 78.0–100.0)
Monocytes Relative: 13 % — ABNORMAL HIGH (ref 3–12)
Platelets: 323 10*3/uL (ref 150–400)
RBC: 4.05 MIL/uL — ABNORMAL LOW (ref 4.22–5.81)
WBC: 7.4 10*3/uL (ref 4.0–10.5)

## 2012-09-05 LAB — COMPREHENSIVE METABOLIC PANEL
ALT: 17 U/L (ref 0–53)
Alkaline Phosphatase: 70 U/L (ref 39–117)
BUN: 11 mg/dL (ref 6–23)
CO2: 32 mEq/L (ref 19–32)
Calcium: 8.4 mg/dL (ref 8.4–10.5)
GFR calc Af Amer: >90 mL/min (ref >90)
GFR calc non Af Amer: >90 mL/min (ref >90)
Glucose, Bld: 118 mg/dL — ABNORMAL HIGH (ref 70–99)
Potassium: 3.6 mEq/L (ref 3.5–5.1)
Sodium: 128 mEq/L — ABNORMAL LOW (ref 135–145)

## 2012-09-05 LAB — GLUCOSE, CAPILLARY: Glucose-Capillary: 142 mg/dL — ABNORMAL HIGH (ref 70–99)

## 2012-09-05 MED ORDER — INSULIN ASPART 100 UNIT/ML ~~LOC~~ SOLN: 0.0000 [IU] | Freq: Three times a day (TID) | SUBCUTANEOUS | Status: DC

## 2012-09-05 MED ORDER — CLINIMIX E/DEXTROSE (5/15) 5 % IV SOLN
INTRAVENOUS | Status: DC
  Administered 2012-09-05: 17:00:00 via INTRAVENOUS
  Filled 2012-09-05: qty 2000

## 2012-09-05 MED ORDER — SODIUM CHLORIDE 0.9 % IV SOLN
250.0000 mL | Freq: Once | INTRAVENOUS | Status: AC
  Administered 2012-09-05: 250 mL via INTRAVENOUS

## 2012-09-05 NOTE — Progress Notes (Addendum)
23 Days Post-Op Procedure(s) (LRB): LEFT HEART CATHETERIZATION WITH CORONARY ANGIOGRAM (N/A) TEMPORARY PACEMAKER INSERTION (Right) PERCUTANEOUS CORONARY STENT INTERVENTION (PCI-S) (Right) Subjective:  Mr. Tamburo with no new complaints this morning.    Objective: Vital signs in last 24 hours: Temp:  [98.1 F (36.7 C)-98.6 F (37 C)] 98.1 F (36.7 C) (10/15 0420) Pulse Rate:  [66-73] 66  (10/15 0420) Cardiac Rhythm:  [-] Normal sinus rhythm (10/14 2045) Resp:  [16-20] 20  (10/15 0420) BP: (94-117)/(48-72) 94/48 mmHg (10/15 0420) SpO2:  [95 %-97 %] 96 % (10/15 0420) Weight:  [215 lb 3.2 oz (97.614 kg)] 215 lb 3.2 oz (97.614 kg) (10/15 0420)  Intake/Output from previous day: 10/14 0701 - 10/15 0700 In: 600 [P.O.:600] Out: 2360 [Urine:2350; Chest Tube:10]  General appearance: alert, cooperative and no distress Heart: regular rate and rhythm Lungs: clear to auscultation bilaterally Abdomen: soft, non-tender; bowel sounds normal; no masses,  no organomegaly Wound: clean and dry  Lab Results:  Basename 09/05/12 0500 09/04/12 0500  WBC 7.4 7.9  HGB 12.2* 12.2*  HCT 35.6* 35.3*  PLT 323 299   BMET:  Basename 09/05/12 0500 09/04/12 0500  NA 128* 128*  K 3.6 3.7  CL 88* 89*  CO2 32 33*  GLUCOSE 118* 111*  BUN 11 13  CREATININE 0.59 0.62  CALCIUM 8.4 8.3*    PT/INR: No results found for this basename: LABPROT,INR in the last 72 hours ABG    Component Value Date/Time   PHART 7.498* 08/17/2012 0447   HCO3 28.1* 08/17/2012 0447   TCO2 29 08/17/2012 0447   O2SAT 99.0 08/17/2012 0447   CBG (last 3)   Basename 09/05/12 0418 09/05/12 0056 09/04/12 1928  GLUCAP 127* 119* 121*    Assessment/Plan: S/P Procedure(s) (LRB): LEFT HEART CATHETERIZATION WITH CORONARY ANGIOGRAM (N/A) TEMPORARY PACEMAKER INSERTION (Right) PERCUTANEOUS CORONARY STENT INTERVENTION (PCI-S) (Right)  1. Chylothorax- drainage significantly decreased over past several days, no output in the last 24  hours 2. Diet- currently on Liquids, would advance diet to low fats 3. Dispo- patient doing well, advance diet, leave chest tube in place for now   LOS: 23 days    BARRETT, ERIN 09/05/2012    Chart reviewed, patient examined, agree with above. Minimal chest tube output. CXR looks ok. There is a small amount of fluid in the fissure and haziness at base as expected after talc. Will start fat modified diet and plan to remove chest tube in am if no significant change in drainage.

## 2012-09-05 NOTE — Progress Notes (Signed)
PARENTERAL NUTRITION CONSULT NOTE - FOLLOW UP  Pharmacy Consult: TPN Indication: Chylothorax  No Known Allergies  Patient Measurements: Height: 6' (182.9 cm) Weight: 215 lb 3.2 oz (97.614 kg) IBW/kg (Calculated) : 77.6  Adjusted Body Weight: 83.9 kg Usual Weight: 98.6 kg  Vital Signs: Temp: 98.1 F (36.7 C) (10/15 0420) Temp src: Oral (10/15 0420) BP: 94/48 mmHg (10/15 0420) Pulse Rate: 66  (10/15 0420) Intake/Output from previous day: 10/14 0701 - 10/15 0700 In: 4516.3 [P.O.:600; TPN:3916.3] Out: 2360 [Urine:2350; Chest Tube:10]  Labs:  Basename 09/05/12 0500 09/04/12 0500 09/03/12 0530  WBC 7.4 7.9 13.3*  HGB 12.2* 12.2* 12.9*  HCT 35.6* 35.3* 37.0*  PLT 323 299 315  APTT -- -- --  INR -- -- --     Basename 09/05/12 0500 09/04/12 0500 09/03/12 0530  NA 128* 128* 129*  K 3.6 3.7 4.2  CL 88* 89* 91*  CO2 32 33* 31  GLUCOSE 118* 111* 130*  BUN 11 13 13   CREATININE 0.59 0.62 0.63  LABCREA -- -- --  CREAT24HRUR -- -- --  CALCIUM 8.4 8.3* 8.5  MG -- 2.2 --  PHOS -- 3.8 --  PROT 6.1 6.0 6.0  ALBUMIN 2.1* 2.1* 2.3*  AST 21 13 10   ALT 17 9 7   ALKPHOS 70 59 55  BILITOT 0.3 0.4 0.6  BILIDIR -- -- --  IBILI -- -- --  PREALBUMIN -- 10.7* --  TRIG -- 70 --  CHOLHDL -- -- --  CHOL -- -- --   Estimated Creatinine Clearance: 127.8 ml/min (by C-G formula based on Cr of 0.59).    Basename 09/05/12 0808 09/05/12 0418 09/05/12 0056  GLUCAP 149* 127* 119*   Insulin Requirements in the past 24 hours:  0 units moderate SSI   Current Nutrition:  TPN + clear liquid diet + vital 1.0 cal liquid BID  Nutritional Goals:  2100-2300 kcal and 100-120 protein  Assessment:  56 YOM admitted 9/22 with syncope and sustained VT with STEMI, emergent cath/PCI, stent, temp. pacer. Developed respiratory distress with VDRF, found to have PNA and moderate right pleural effusion on chest CT.  Found to have 300 cc chylous pleural fluid out of chest tube in 12h on 08/24/12. Diet  changed to NPO and orders for TNA per pharmacy.     AV:[id number] clear liquie diet and Vital 1.0 liquid BID (refused yesterday). Continues on PO multivitamin, PO PPI. Per RD, patient is eating breads with clear liquid diet, so if likely getting more calories than a std clear liquid diet. Plan to advance diet today. Endo: TSH elevated, T4 WNL, T3 slightly low.  no hx DM - CBGs controlled with minimal SSI use with decreased dextrose load in TPN. Lytes: Chronic hyponatremia;  Cl also slightly low, on scheduled mag oxide 400 mg BID. Other lytes good. Renal: SCr stable, good UOP at  1 ml/kg/hr, NS at [facility] Hepatobil: LFTs / TG WNL. Prealbumin continues to decrease to 10.7 - suspect down trend d/t ongoing inflammation. Pulm: stable on RA - on Xopenex. s/p talc pleurodesis.  Neuro: No issues Cards: hx systolic CHF (EF ~82%). STEMI s/p PCI on amiodarone, ASA 81mg , lisinopril, Magox, metoprolol, Brilinta - BP 94/48, HR 66 - amio increased BID due to vtach 10/7 AM, none since 10/8 AM; need ICD but not indicated if has recurrent malignancy, cannot r/o since pt refused PET scan, Life-Vest in the meantime  ID: s/p Ancef for MSSA in sputum 9/24; afebrile, WBC trending up Heme/Onc: hx small cell  lung cancer; no clear signs of recurrent cancer per chest CT. Pt refused PET scan - CBC stable Best Practices: PO PPI, SCDs TPN access: PICC TPN days: 12  Plan:  - Continue Clinimix E 5/15 at 55 ml/hr to provide ~60% goal protein and ~50% of minimal kCal needs  - Provide lipids at 10 ml/hr MWF only due to national shortage  - Continue PO multivitamin daily (no MVI / trace elements in TPN) - f/u diet advancement and toleration - prealbumin trending down but hesitant to increase TPN back up to goal due to plans to advance diet today - will need to increase back to goal if diet is not advanced - f/u AM BMET - Change SSI + CBGs to Q8H - may be able to DC tomorrow  Lysle Pearl, PharmD, BCPS Pager # [phone]  09/05/2012 8:29 AM

## 2012-09-05 NOTE — Progress Notes (Signed)
[facility]  Subjective: Sore at CT site.  No dizziness.  Objective: Vital signs in last 24 hours: Temp:  [98.1 F (36.7 C)-98.6 F (37 C)] 98.1 F (36.7 C) (10/15 0420) Pulse Rate:  [66-73] 66  (10/15 0420) Resp:  [16-20] 20  (10/15 0420) BP: (94-117)/(48-72) 94/48 mmHg (10/15 0420) SpO2:  [95 %-97 %] 96 % (10/15 0420) Weight:  [97.614 kg (215 lb 3.2 oz)] 97.614 kg (215 lb 3.2 oz) (10/15 0420) Last BM Date: 09/02/12  Intake/Output from previous day: 10/14 0701 - 10/15 0700 In: 4516.3 [P.O.:600; WUJ:8119.1] Out: 2360 [Urine:2350; Chest Tube:10] Intake/Output this shift:    Medications Current Facility-Administered Medications  Medication Dose Route Frequency Provider Last Rate Last Dose  . 0.9 %  sodium chloride infusion   Intravenous Continuous Kalman Shan, MD 20 mL/hr at 08/24/12 0600    . acetaminophen (TYLENOL) tablet 650 mg  650 mg Oral Q4H PRN Lennette Bihari, MD   650 mg at 08/21/12 0752  . ALPRAZolam Prudy Feeler) tablet 0.25 mg  0.25 mg Oral TID PRN Abelino Derrick, PA      . amiodarone (PACERONE) tablet 400 mg  400 mg Oral BID Chrystie Nose, MD   400 mg at 09/04/12 2200  . aspirin EC tablet 81 mg  81 mg Oral Daily Lennette Bihari, MD   81 mg at 09/04/12 1135  . diphenhydrAMINE (BENADRYL) capsule 25 mg  25 mg Oral QHS PRN Storm Frisk, MD   25 mg at 08/25/12 0019  . fat emulsion 20 % infusion 240 mL  240 mL Intravenous Continuous TPN Drake Leach Rumbarger, PHARMD 10 mL/hr at 09/04/12 1758 240 mL at 09/04/12 1758  . feeding supplement (VITAL 1.0 CAL) liquid 237 mL  237 mL Oral BID BM Mihai Croitoru, MD   237 mL at 09/03/12 1400  . insulin aspart (novoLOG) injection 0-15 Units  0-15 Units Subcutaneous Q4H [place], [place]   2 Units at 09/04/12 4782  . levalbuterol (XOPENEX) nebulizer solution 0.63 mg  0.63 mg Nebulization Q6H PRN Abelino Derrick, PA   0.63 mg at 08/15/12 1008  . lidocaine (LIDODERM) 5 % 1 patch  1 patch  Transdermal Q24H Roxine Caddy, MD   1 patch at 09/02/12 0900  . lisinopril (PRINIVIL,ZESTRIL) tablet 2.5 mg  2.5 mg Oral Daily Eda Paschal [place], [place]   2.5 mg at 09/04/12 1135  . magnesium oxide (MAG-OX) tablet 400 mg  400 mg Per NG tube BID Thurmon Fair, MD   400 mg at 09/04/12 2127  . metoprolol tartrate (LOPRESSOR) tablet 25 mg  25 mg Oral BID Eda Paschal [place], [place]   25 mg at 09/04/12 2126  . multivitamin with minerals tablet 1 tablet  1 tablet Oral Daily Kalman Shan, MD   1 tablet at 09/04/12 1136  . ondansetron (ZOFRAN) injection 4 mg  4 mg Intravenous Q4H PRN Chrystie Nose, MD   4 mg at 09/01/12 2211  . oxyCODONE (Oxy IR/ROXICODONE) immediate release tablet 10 mg  10 mg Oral Q3H PRN Alleen Borne, MD   10 mg at 09/05/12 0436  . pantoprazole (PROTONIX) EC tablet 40 mg  40 mg Oral Q0600 Eda Paschal [place], [place]   40 mg at 09/05/12 0659  . sodium chloride 0.9 % injection 10-40 mL  10-40 mL Intracatheter Q12H Arley Phenix, MD   20 mL at 08/27/12 2200  . sodium chloride 0.9 % injection 10-40 mL  10-40 mL  Intracatheter PRN Arley Phenix, MD   10 mL at 09/05/12 0553  . Ticagrelor (BRILINTA) tablet 90 mg  90 mg Oral BID Lennette Bihari, MD   90 mg at 09/04/12 2126  . TPN (CLINIMIX) +/- additives   Intravenous Continuous TPN Meera Concha Se, PHARMD      . TPN [facility]) +/- additives   Intravenous Continuous TPN Drake Leach Rumbarger, PHARMD 55 mL/hr at 09/04/12 1758    . traMADol (ULTRAM) tablet 50 mg  50 mg Oral Q6H PRN Abelino Derrick, PA   50 mg at 08/29/12 8295  . zolpidem (AMBIEN) tablet 5 mg  5 mg Oral QHS PRN Abelino Derrick, PA      . DISCONTD: furosemide (LASIX) injection 40 mg  40 mg Intravenous Daily Wilburt Finlay, PA        PE: General appearance: alert, cooperative and no distress Lungs: clear to auscultation bilaterally Heart: regular rate and rhythm, S1, S2 normal, no murmur, click, rub or gallop Extremities: No LEE Pulses: 2+ and symmetric  Lab Results:   Basename 09/05/12 0500  09/04/12 0500 09/03/12 0530  WBC 7.4 7.9 13.3*  HGB 12.2* 12.2* 12.9*  HCT 35.6* 35.3* 37.0*  PLT 323 299 315   BMET  Basename 09/05/12 0500 09/04/12 0500 09/03/12 0530  NA 128* 128* 129*  K 3.6 3.7 4.2  CL 88* 89* 91*  CO2 32 33* 31  GLUCOSE 118* 111* 130*  BUN 11 13 13   CREATININE 0.59 0.62 0.63  CALCIUM 8.4 8.3* 8.5    Assessment/Plan    Principal Problem:  *Sustained ventricular tachycardia with associated syncope; Monomorphic Active Problems:  Hx of cancer of lung, 3 years ago treated with radiation and chemo   GERD (gastroesophageal reflux disease)  Hyperglycemia, pt has been on meds for diabetes in the past  Cardiomyopathy, ischemic, by cardiac cath 30-35% 08/13/12   CAD (coronary artery disease) 3 vessel disease  S/P coronary artery stent placement, acutely to RCA for acute Post. 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  No CT drainage in the last 24 hours.  Na+ low but stable.  CXR read pending.  Images look slightly improved.  SBP in the mid 90's.  No NSVT.  Amio bid until Oct. 21, 2013, then change to 600 mg Daily for one month and then 400mg  daily.   Lifevest arranged.     LOS: 23 days    Bing Duffey 09/05/2012 8:24 AM

## 2012-09-06 ENCOUNTER — Inpatient Hospital Stay (HOSPITAL_COMMUNITY): Payer: BC Managed Care – PPO

## 2012-09-06 ENCOUNTER — Encounter (HOSPITAL_COMMUNITY): Payer: Self-pay | Admitting: Cardiology

## 2012-09-06 DIAGNOSIS — R7989 Other specified abnormal findings of blood chemistry: Secondary | ICD-10-CM

## 2012-09-06 HISTORY — DX: Other specified abnormal findings of blood chemistry: R79.89

## 2012-09-06 LAB — CBC WITH DIFFERENTIAL/PLATELET
Hemoglobin: 11.5 g/dL — ABNORMAL LOW (ref 13.0–17.0)
Lymphocytes Relative: 14 % (ref 12–46)
Lymphs Abs: 0.8 10*3/uL (ref 0.7–4.0)
Neutro Abs: 3.2 10*3/uL (ref 1.7–7.7)
Neutrophils Relative %: 58 % (ref 43–77)
Platelets: 304 10*3/uL (ref 150–400)
RBC: 3.86 MIL/uL — ABNORMAL LOW (ref 4.22–5.81)
WBC: 5.5 10*3/uL (ref 4.0–10.5)

## 2012-09-06 LAB — COMPREHENSIVE METABOLIC PANEL
ALT: 27 U/L (ref 0–53)
Alkaline Phosphatase: 75 U/L (ref 39–117)
CO2: 33 mEq/L — ABNORMAL HIGH (ref 19–32)
Chloride: 91 mEq/L — ABNORMAL LOW (ref 96–112)
GFR calc Af Amer: >90 mL/min (ref >90)
GFR calc non Af Amer: >90 mL/min (ref >90)
Glucose, Bld: 104 mg/dL — ABNORMAL HIGH (ref 70–99)
Potassium: 3.5 mEq/L (ref 3.5–5.1)
Sodium: 130 mEq/L — ABNORMAL LOW (ref 135–145)
Total Bilirubin: 0.3 mg/dL (ref 0.3–1.2)
Total Protein: 5.8 g/dL — ABNORMAL LOW (ref 6.0–8.3)

## 2012-09-06 LAB — GLUCOSE, CAPILLARY
Glucose-Capillary: 123 mg/dL — ABNORMAL HIGH (ref 70–99)
Glucose-Capillary: 124 mg/dL — ABNORMAL HIGH (ref 70–99)

## 2012-09-06 MED ORDER — POTASSIUM CHLORIDE CRYS ER 20 MEQ PO TBCR
40.0000 meq | EXTENDED_RELEASE_TABLET | Freq: Once | ORAL | Status: AC
  Administered 2012-09-06: 40 meq via ORAL
  Filled 2012-09-06: qty 2

## 2012-09-06 NOTE — Progress Notes (Signed)
Pt. Seen and examined. Agree with the NP/PA-C note as written.  Agree with pulling chest tube today per Dr. Laneta Simmers. Fit lifevest. Consider compression stockings for low bp. Hopeful d/c tomorrow.  Chrystie Nose, MD, [facility] Attending Cardiologist [facility]

## 2012-09-06 NOTE — Progress Notes (Signed)
CARDIAC REHAB PHASE I   PRE:  Rate/Rhythm: 71 SR    BP: sitting 96/50    SaO2:   MODE:  Ambulation: 500 ft   POST:  Rate/Rhythm: 77    BP: sitting 130/60     SaO2: 96 RA  Tolerated well. Continues with slight dizziness. Sts worse when first up, gets less as he walks. BP stable, increases with every walk to 130-140 systolic.  1050-1100  Elissa Lovett [place] CES, ACSM

## 2012-09-06 NOTE — Progress Notes (Addendum)
24 Days Post-Op Procedure(s) (LRB): LEFT HEART CATHETERIZATION WITH CORONARY ANGIOGRAM (N/A) TEMPORARY PACEMAKER INSERTION (Right) PERCUTANEOUS CORONARY STENT INTERVENTION (PCI-S) (Right) Subjective: [name] [name] complains of pain around chest tube site.  He was started on a low fat diet yesterday.  Objective: Vital signs in last 24 hours: Temp:  [97.4 F (36.3 C)-98.1 F (36.7 C)] 98.1 F (36.7 C) (10/16 0549) Pulse Rate:  [66-75] 66  (10/16 0549) Cardiac Rhythm:  [-] Normal sinus rhythm (10/15 2030) Resp:  [18-20] 20  (10/16 0549) BP: (85-103)/(50-58) 101/53 mmHg (10/16 0549) SpO2:  [95 %-100 %] 95 % (10/16 0549)   Intake/Output from previous day: 10/15 0701 - 10/16 0700 In: 480 [P.O.:480] Out: 2025 [Urine:2025]  General appearance: alert, cooperative and no distress Heart: regular rate and rhythm Lungs: clear to auscultation bilaterally Abdomen: soft, non-tender; bowel sounds normal; no masses,  no organomegaly Wound: clean and dry  Lab Results:  Basename 09/06/12 0515 09/05/12 0500  WBC 5.5 7.4  HGB 11.5* 12.2*  HCT 34.3* 35.6*  PLT 304 323   BMET:  Basename 09/06/12 0515 09/05/12 0500  NA 130* 128*  K 3.5 3.6  CL 91* 88*  CO2 33* 32  GLUCOSE 104* 118*  BUN 10 11  CREATININE 0.62 0.59  CALCIUM 8.2* 8.4    PT/INR: No results found for this basename: LABPROT,INR in the last 72 hours ABG    Component Value Date/Time   PHART 7.498* 08/17/2012 0447   HCO3 28.1* 08/17/2012 0447   TCO2 29 08/17/2012 0447   O2SAT 99.0 08/17/2012 0447   CBG (last 3)   Basename 09/05/12 2357 09/05/12 1625 09/05/12 0808  GLUCAP 136* 142* 149*    Assessment/Plan: S/P Procedure(s) (LRB): LEFT HEART CATHETERIZATION WITH CORONARY ANGIOGRAM (N/A) TEMPORARY PACEMAKER INSERTION (Right) PERCUTANEOUS CORONARY STENT INTERVENTION (PCI-S) (Right)  1. 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 Dispo- can  likely remove chest tube today, repeat CXR in AM, care per primary    LOS: 24 days    [name] [name], [name] [name]    Chart reviewed, patient examined, agree with above. Minimal chest tube output after diet started yesterday.  CXR this am not done yet. Breath sounds are fairly good in the right chest, even at the base. Will remove chest tube. Continue fat modified diet. I think TNA can be stopped. Follow up on CXR. If cxr looks ok he should be able to go home tomorrow to follow up with his oncologists at [facility].

## 2012-09-06 NOTE — Progress Notes (Signed)
Subjective: No new c/o.  CT remains in place with minimal drainage.  Now on low fat diet.   Objective:  Intake/Output from previous day: 10/15 0701 - 10/16 0700 In: 480 [P.O.:480] Out: 2025 [Urine:2025]   Physical Exam: Vital signs in last 24 hours:  BP 101/53  Pulse 66  Temp 98.1 F (36.7 C) (Oral)  Resp 20  Ht 6' (1.829 m)  Wt 215 lb 3.2 oz (97.614 kg)  BMI 29.19 kg/m2  SpO2 95%  General: WD male in NAD, OOB in chair  Neuro: awake, alert, appropriate CV: s1s2 rrr PULM: resps even non labored, Chest with few crackles on right. Rt chest tube with minimal cloudy amber drainage. GI: abd soft, +bs Extremities: warm and dry, no edema    Lab Results:  Basename 09/06/12 0515 09/05/12 0500 09/04/12 0500  WBC 5.5 7.4 7.9  HGB 11.5* 12.2* 12.2*  HCT 34.3* 35.6* 35.3*  PLT 304 323 299   BMET  Basename 09/06/12 0515 09/05/12 0500 09/04/12 0500  NA 130* 128* 128*  K 3.5 3.6 3.7  CL 91* 88* 89*  CO2 33* 32 33*  GLUCOSE 104* 118* 111*  BUN 10 11 13   CREATININE 0.62 0.59 0.62  CALCIUM 8.2* 8.4 8.3*    Studies/Results: Dg Chest 2 View  09/05/2012  *RADIOLOGY REPORT*  Clinical Data: Right hilar thorax status post chest tube.  CHEST - 2 VIEW  Comparison: 09/04/2012 and 08/28/2012  Findings: Right-sided chest tube unchanged.  Left PICC line with tip over the SVC.  Lungs are hypoinflated as there is no change in a small right apical pneumothorax.  Small to moderate right pleural effusion slightly worse. Left lung is clear.  Cardiomediastinal silhouette and remainder of the exam to include a mild compression deformity at the thoracolumbar junction is unchanged.  IMPRESSION: Stable small right apical pneumothorax with right-sided chest tube in place.  Small to moderate right pleural effusion slightly worse.  Left-sided PICC line with tip over the SVC.  Stable mild compression deformity near the thoracolumbar junction.   Original Report Authenticated By: Elba Barman, M.D.    Dg  Chest Port 1 View  09/04/2012  *RADIOLOGY REPORT*  Clinical Data: Follow-up chest tube.  PORTABLE CHEST - 1 VIEW  Comparison: 09/04/2012.  Findings: The right-sided chest tube is stable.  A small apical pneumothorax is noted.  The left PICC line tip has been repositioned.  The tip is at the brachiocephalic SVC junction.  The heart and lungs are stable.  IMPRESSION:  1.  Repositioning of the left PICC line.  The tip is now in the SVC at the brachiocephalic SVC junction 2.  Stable right-sided chest tube with small apical pneumothorax.   Original Report Authenticated By: P. Loralie Champagne, M.D.     Principal Problem:  *Sustained ventricular tachycardia with associated syncope; Monomorphic Active Problems:  Hx of cancer of lung, 3 years ago treated with radiation and chemo   GERD (gastroesophageal reflux disease)  Hyperglycemia, pt has been on meds for diabetes in the past  Cardiomyopathy, ischemic, by cardiac cath 30-35% 08/13/12   CAD (coronary artery disease) 3 vessel disease  S/P coronary artery stent placement, acutely to RCA for acute Post. 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  Now tol low fat diet.  S/p talc pleurodesis 10/11. CT surgery following.  Likely d/c CT 10/16 per CVTS.   10/9 repeat plural fluid triglycerides 10/10 increased drainage from chest tube(360 cc) and cloudy.  10/11 170 ccTalc pleurodesis planned per  per CVTS- see note. 10/12 s/p talc pleurodesis per CVTS  >Given h/o cancer there is a concern that the chylothorax is related to recurrent malignancy; Discussed  with the patient the possibility of obtaining a PET. He refused to have a PET now or in the future. He would like to follow up in regards to his cancer with his primary oncologist.   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  CT Scans here reviewed w/ ?regarding a hi left paratrach LN; We do not have any of the [facility] records & he needs f/u w/ Oncology ASAP but refuses PET/onc f/u here.   On TNA: On fat mod diet and TNA Daily labs   Dispo -- cont pt/ot (although he refuses at times).  Once decisions made regarding CT will need outpt oncology follow up at [facility].    Danford Bad, NP 09/06/2012  8:23 AM Pager: [phone] or [phone]  *Care during the described time interval was provided by me and/or other providers on the critical care team. I have reviewed this patient's available data, including medical history, events of note, physical examination and test results as part of my evaluation.  Levy Pupa, MD, PhD 09/06/2012, 1:55 PM [facility] [phone] or if no answer [phone]

## 2012-09-06 NOTE — Progress Notes (Signed)
Subjective: Soreness at chest tube site  Objective: Vital signs in last 24 hours: Temp:  [97.4 F (36.3 C)-98.1 F (36.7 C)] 98.1 F (36.7 C) (10/16 0549) Pulse Rate:  [66-75] 66  (10/16 0549) Resp:  [18-20] 20  (10/16 0549) BP: (85-103)/(50-58) 101/53 mmHg (10/16 0549) SpO2:  [95 %-100 %] 95 % (10/16 0549) Weight change:  Last BM Date: 09/02/12 Intake/Output from previous day: -775  ( 10cc out from chest tube) 10/15 0701 - 10/16 0700 In: 480 [P.O.:480] Out: 2025 [Urine:2025] Intake/Output this shift:    PE: General:alert and oriented no specific complaints Heart:S1S2 RRR Lungs:few rhonchi Abd:+ BS, soft, nontender Ext:no edema    Lab Results:  Basename 09/06/12 0515 09/05/12 0500  WBC 5.5 7.4  HGB 11.5* 12.2*  HCT 34.3* 35.6*  PLT 304 323   BMET  Basename 09/06/12 0515 09/05/12 0500  NA 130* 128*  K 3.5 3.6  CL 91* 88*  CO2 33* 32  GLUCOSE 104* 118*  BUN 10 11  CREATININE 0.62 0.59  CALCIUM 8.2* 8.4   No results found for this basename: TROPONINI:2,CK,MB:2 in the last 72 hours  Lab Results  Component Value Date   CHOL 156 08/22/2012   HDL 35* 08/16/2012   LDLCALC 74 08/16/2012   TRIG 70 09/04/2012   CHOLHDL 3.7 08/16/2012   Lab Results  Component Value Date   HGBA1C 5.9* 08/13/2012     Lab Results  Component Value Date   TSH 11.427* 08/28/2012    Hepatic Function Panel  Basename 09/06/12 0515  PROT 5.8*  ALBUMIN 2.1*  AST 30  ALT 27  ALKPHOS 75  BILITOT 0.3  BILIDIR --  IBILI --   No results found for this basename: CHOL in the last 72 hours No results found for this basename: PROTIME in the last 72 hours    EKG: Orders placed during the hospital encounter of 08/13/12  . EKG 12-LEAD  . EKG 12-LEAD  . EKG 12-LEAD  . EKG 12-LEAD  . EKG 12-LEAD  . EKG 12-LEAD  . EKG 12-LEAD  . EKG 12-LEAD  . EKG 12-LEAD  . EKG 12-LEAD  . EKG  . EKG 12-LEAD  . EKG 12-LEAD  . EKG 12-LEAD  . EKG 12-LEAD    Studies/Results: Dg Chest 2  View  09/05/2012  *RADIOLOGY REPORT*  Clinical Data: Right hilar thorax status post chest tube.  CHEST - 2 VIEW  Comparison: 09/04/2012 and 08/28/2012  Findings: Right-sided chest tube unchanged.  Left PICC line with tip over the SVC.  Lungs are hypoinflated as there is no change in a small right apical pneumothorax.  Small to moderate right pleural effusion slightly worse. Left lung is clear.  Cardiomediastinal silhouette and remainder of the exam to include a mild compression deformity at the thoracolumbar junction is unchanged.  IMPRESSION: Stable small right apical pneumothorax with right-sided chest tube in place.  Small to moderate right pleural effusion slightly worse.  Left-sided PICC line with tip over the SVC.  Stable mild compression deformity near the thoracolumbar junction.   Original Report Authenticated By: Elba Barman, M.D.    Dg Chest Port 1 View  09/04/2012  *RADIOLOGY REPORT*  Clinical Data: Follow-up chest tube.  PORTABLE CHEST - 1 VIEW  Comparison: 09/04/2012.  Findings: The right-sided chest tube is stable.  A small apical pneumothorax is noted.  The left PICC line tip has been repositioned.  The tip is at the brachiocephalic SVC junction.  The heart and lungs are stable.  IMPRESSION:  1.  Repositioning of the left PICC line.  The tip is now in the SVC at the brachiocephalic SVC junction 2.  Stable right-sided chest tube with small apical pneumothorax.   Original Report Authenticated By: P. Loralie Champagne, M.D.     Medications: I have reviewed the patient's current medications.    . sodium chloride 0.9% NICU IV bolus  250 mL Intravenous Once  . amiodarone  400 mg Oral BID  . aspirin EC  81 mg Oral Daily  . feeding supplement (VITAL 1.0 CAL)  237 mL Oral BID BM  . lidocaine  1 patch Transdermal Q24H  . lisinopril  2.5 mg Oral Daily  . magnesium oxide  400 mg Per NG tube BID  . metoprolol tartrate  25 mg Oral BID  . multivitamin with minerals  1 tablet Oral Daily  .  pantoprazole  40 mg Oral Q0600  . potassium chloride  40 mEq Oral Once  . sodium chloride  10-40 mL Intracatheter Q12H  . Ticagrelor  90 mg Oral BID  . DISCONTD: insulin aspart  0-15 Units Subcutaneous Q4H  . DISCONTD: insulin aspart  0-15 Units Subcutaneous Q8H   Assessment/Plan: Principal Problem:  *Sustained ventricular tachycardia with associated syncope; Monomorphic Active Problems:  Hx of cancer of lung, 3 years ago treated with radiation and chemo   GERD (gastroesophageal reflux disease)  Hyperglycemia, pt has been on meds for diabetes in the past  Cardiomyopathy, ischemic, by cardiac cath 30-35% 08/13/12   CAD (coronary artery disease) 3 vessel disease  S/P coronary artery stent placement, acutely to RCA for acute Post. 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 D/c chest tube today? Dizziness with walking.  BP still low with systolic BP to 86/50 yest AM  Stop TPN per Dr. Laneta Simmers.  Will hope for discharge tomorrow.  Follow up with oncology in [place].   Chest tube out today. Will notify [facility] for life vest fitting. No further episodes of vtach, sustained or non sustained.  LOS: 24 days   [name] [name] 09/06/2012, 8:28 AM

## 2012-09-06 NOTE — Progress Notes (Signed)
PARENTERAL NUTRITION CONSULT NOTE - FOLLOW UP  Pharmacy Consult: TPN Indication: Chylothorax  No Known Allergies  Patient Measurements: Height: 6' (182.9 cm) Weight: 215 lb 3.2 oz (97.614 kg) IBW/kg (Calculated) : 77.6  Adjusted Body Weight: 83.9 kg Usual Weight: 98.6 kg  Vital Signs: Temp: 98.1 F (36.7 C) (10/16 0549) Temp src: Oral (10/16 0549) BP: 101/53 mmHg (10/16 0549) Pulse Rate: 66  (10/16 0549) Intake/Output from previous day: 10/15 0701 - 10/16 0700 In: 480 [P.O.:480] Out: 2025 [Urine:2025]  Labs:  [facility] 09/06/12 0515 09/05/12 0500 09/04/12 0500  WBC 5.5 7.4 7.9  HGB 11.5* 12.2* 12.2*  HCT 34.3* 35.6* 35.3*  PLT 304 323 299  APTT -- -- --  INR -- -- --     Basename 09/06/12 0515 09/05/12 0500 09/04/12 0500  NA 130* 128* 128*  K 3.5 3.6 3.7  CL 91* 88* 89*  CO2 33* 32 33*  GLUCOSE 104* 118* 111*  BUN 10 11 13   CREATININE 0.62 0.59 0.62  LABCREA -- -- --  CREAT24HRUR -- -- --  CALCIUM 8.2* 8.4 8.3*  MG -- -- 2.2  PHOS -- -- 3.8  PROT 5.8* 6.1 6.0  ALBUMIN 2.1* 2.1* 2.1*  AST 30 21 13   ALT 27 17 9   ALKPHOS 75 70 59  BILITOT 0.3 0.3 0.4  BILIDIR -- -- --  IBILI -- -- --  PREALBUMIN -- -- 10.7*  TRIG -- -- 70  CHOLHDL -- -- --  CHOL -- -- --   Estimated Creatinine Clearance: 127.8 ml/min (by C-G formula based on Cr of 0.62).    Basename 09/05/12 2357 09/05/12 1625 09/05/12 0808  GLUCAP 136* 142* 149*   Insulin Requirements in the past 24 hours:  0 units moderate SSI   Current Nutrition:  TPN + fat modified diet + vital 1.0 cal liquid BID  Nutritional Goals:  2100-2300 kcal and 100-120 protein  Assessment:  56 YOM admitted 9/22 with syncope and sustained VT with STEMI, emergent cath/PCI, stent, temp. pacer. Developed respiratory distress with VDRF, found to have PNA and moderate right pleural effusion on chest CT.  Found to have 300 cc chylous pleural fluid out of chest tube in 12h on 08/24/12. Diet changed to NPO and orders for  TNA per pharmacy.     GI: Diet advanced yesterday and pt has been tolerating. Continues on PO multivitamin, PO PPI.  Endo: TSH elevated, T4 WNL, T3 slightly low.  no hx DM - CBGs controlled with minimal SSI use with decreased dextrose load in TPN (CBGs 104-149 >> did not require any SSI) Lytes: Chronic hyponatremia;  Cl also slightly low, on scheduled mag oxide 400 mg BID. K+ 3.5 Renal: SCr stable, good UOP at  0.9 ml/kg/hr, NS at [facility] Hepatobil: LFTs / TG WNL. Prealbumin continues to decrease to 10.7 - suspect down trend d/t ongoing inflammation. Pulm: stable on RA - on Xopenex. s/p talc pleurodesis.  Neuro: No issues Cards: hx systolic CHF (EF ~81%). STEMI s/p PCI on amiodarone, ASA 81mg , lisinopril, Magox, metoprolol, Brilinta - BP 101/53, HR 66 - amio increased BID due to vtach 10/7 AM, need ICD  ID: s/p Ancef for MSSA in sputum 9/24; afebrile, WBC WNL Heme/Onc: hx small cell lung cancer; no clear signs of recurrent cancer per chest CT. Pt refused PET scan - CBC stable Best Practices: PO PPI, SCDs TPN access: PICC TPN days: 13  Plan:  - DC TPN as pt is eating full meals (already stopped by RN) per MD -  Continue PO multivitamin daily - DC SSI coverage - KCl PO x 1  Pharmacy will sign-off. Please re-consult [place] if anything else is needed. Thank you!  Lysle Pearl, PharmD, BCPS Pager # [phone] 09/06/2012 8:03 AM

## 2012-09-07 ENCOUNTER — Inpatient Hospital Stay (HOSPITAL_COMMUNITY): Payer: BC Managed Care – PPO

## 2012-09-07 ENCOUNTER — Encounter (HOSPITAL_COMMUNITY): Payer: Self-pay | Admitting: Cardiology

## 2012-09-07 DIAGNOSIS — I469 Cardiac arrest, cause unspecified: Secondary | ICD-10-CM

## 2012-09-07 HISTORY — DX: Cardiac arrest, cause unspecified: I46.9

## 2012-09-07 LAB — CBC WITH DIFFERENTIAL/PLATELET
Hemoglobin: 12.6 g/dL — ABNORMAL LOW (ref 13.0–17.0)
Lymphocytes Relative: 14 % (ref 12–46)
Lymphs Abs: 1 10*3/uL (ref 0.7–4.0)
MCH: 30.4 pg (ref 26.0–34.0)
Monocytes Relative: 12 % (ref 3–12)
Neutro Abs: 4.7 10*3/uL (ref 1.7–7.7)
Neutrophils Relative %: 62 % (ref 43–77)
Platelets: 300 10*3/uL (ref 150–400)
RBC: 4.15 MIL/uL — ABNORMAL LOW (ref 4.22–5.81)
WBC: 7.6 10*3/uL (ref 4.0–10.5)

## 2012-09-07 LAB — COMPREHENSIVE METABOLIC PANEL WITH GFR
ALT: 25 U/L (ref 0–53)
AST: 24 U/L (ref 0–37)
Albumin: 2.3 g/dL — ABNORMAL LOW (ref 3.5–5.2)
Alkaline Phosphatase: 83 U/L (ref 39–117)
BUN: 11 mg/dL (ref 6–23)
CO2: 32 meq/L (ref 19–32)
Calcium: 8.6 mg/dL (ref 8.4–10.5)
Chloride: 91 meq/L — ABNORMAL LOW (ref 96–112)
Creatinine, Ser: 0.71 mg/dL (ref 0.50–1.35)
GFR calc Af Amer: >90 mL/min (ref >90)
GFR calc non Af Amer: >90 mL/min (ref >90)
Glucose, Bld: 100 mg/dL — ABNORMAL HIGH (ref 70–99)
Potassium: 3.9 meq/L (ref 3.5–5.1)
Sodium: 130 meq/L — ABNORMAL LOW (ref 135–145)
Total Bilirubin: 0.4 mg/dL (ref 0.3–1.2)
Total Protein: 6.3 g/dL (ref 6.0–8.3)

## 2012-09-07 LAB — TSH: TSH: 16.116 u[IU]/mL — ABNORMAL HIGH (ref 0.350–4.500)

## 2012-09-07 LAB — GLUCOSE, CAPILLARY: Glucose-Capillary: 140 mg/dL — ABNORMAL HIGH (ref 70–99)

## 2012-09-07 MED ORDER — TICAGRELOR 90 MG PO TABS: 90.0000 mg | ORAL_TABLET | Freq: Two times a day (BID) | ORAL | Status: DC

## 2012-09-07 MED ORDER — ASPIRIN 81 MG PO TBEC: 81.0000 mg | DELAYED_RELEASE_TABLET | Freq: Every day | ORAL | Status: AC

## 2012-09-07 MED ORDER — ACETAMINOPHEN 325 MG PO TABS: 650.0000 mg | ORAL_TABLET | ORAL | Status: DC | PRN

## 2012-09-07 MED ORDER — OXYCODONE HCL 10 MG PO TABS: 10.0000 mg | ORAL_TABLET | ORAL | Status: DC | PRN

## 2012-09-07 MED ORDER — LIDOCAINE 5 % EX PTCH: 1.0000 | MEDICATED_PATCH | CUTANEOUS | Status: DC

## 2012-09-07 MED ORDER — METOPROLOL TARTRATE 25 MG PO TABS: 25.0000 mg | ORAL_TABLET | Freq: Two times a day (BID) | ORAL | Status: DC

## 2012-09-07 MED ORDER — MAGNESIUM OXIDE 400 (241.3 MG) MG PO TABS
400.0000 mg | ORAL_TABLET | Freq: Two times a day (BID) | ORAL | Status: DC
Start: 2012-09-07 — End: 2013-07-24

## 2012-09-07 MED ORDER — AMIODARONE HCL 200 MG PO TABS: 600.0000 mg | ORAL_TABLET | Freq: Every day | ORAL | Status: DC

## 2012-09-07 MED ORDER — VITAL 1.0 CAL PO LIQD: 237.0000 mL | Freq: Two times a day (BID) | ORAL | Status: DC

## 2012-09-07 MED ORDER — PANTOPRAZOLE SODIUM 40 MG PO TBEC: 40.0000 mg | DELAYED_RELEASE_TABLET | Freq: Every day | ORAL | Status: DC

## 2012-09-07 MED ORDER — ADULT MULTIVITAMIN W/MINERALS CH: 1.0000 | ORAL_TABLET | Freq: Every day | ORAL | Status: AC

## 2012-09-07 MED ORDER — DIPHENHYDRAMINE HCL 25 MG PO CAPS: 25.0000 mg | ORAL_CAPSULE | Freq: Every evening | ORAL | Status: DC | PRN

## 2012-09-07 MED ORDER — LISINOPRIL 2.5 MG PO TABS: 2.5000 mg | ORAL_TABLET | Freq: Every day | ORAL | Status: DC

## 2012-09-07 MED ORDER — AMIODARONE HCL 200 MG PO TABS
600.0000 mg | ORAL_TABLET | Freq: Every day | ORAL | Status: DC
  Administered 2012-09-07: 400 mg via ORAL
  Filled 2012-09-07: qty 3

## 2012-09-07 MED ORDER — TRAMADOL HCL 50 MG PO TABS: 50.0000 mg | ORAL_TABLET | Freq: Four times a day (QID) | ORAL | Status: DC | PRN

## 2012-09-07 NOTE — Progress Notes (Addendum)
25 Days Post-Op Procedure(s) (LRB): LEFT HEART CATHETERIZATION WITH CORONARY ANGIOGRAM (N/A) TEMPORARY PACEMAKER INSERTION (Right) PERCUTANEOUS CORONARY STENT INTERVENTION (PCI-S) (Right) Subjective:  [name] has no complaints this morning.    Objective: Vital signs in last 24 hours: Temp:  [97.6 F (36.4 C)-98.3 F (36.8 C)] 97.6 F (36.4 C) (10/17 0530) Pulse Rate:  [60-66] 60  (10/17 0530) Cardiac Rhythm:  [-] Normal sinus rhythm (10/16 2000) Resp:  [20] 20  (10/17 0530) BP: (92-94)/(54-56) 92/54 mmHg (10/17 0530) SpO2:  [95 %-98 %] 98 % (10/17 0530) Weight:  [216 lb 8 oz (98.204 kg)] 216 lb 8 oz (98.204 kg) (10/17 0530)  Intake/Output from previous day: 10/16 0701 - 10/17 0700 In: 240 [P.O.:240] Out: 675 [Urine:675]  General appearance: alert, cooperative and no distress Heart: regular rate and rhythm Lungs: clear to auscultation bilaterally Abdomen: soft, non-tender; bowel sounds normal; no masses,  no organomegaly Wound: clean and dry  Lab Results:  [facility] 09/07/12 0530 09/06/12 0515  WBC 7.6 5.5  HGB 12.6* 11.5*  HCT 36.7* 34.3*  PLT 300 304   BMET:  Basename 09/07/12 0530 09/06/12 0515  NA 130* 130*  K 3.9 3.5  CL 91* 91*  CO2 32 33*  GLUCOSE 100* 104*  BUN 11 10  CREATININE 0.71 0.62  CALCIUM 8.6 8.2*    PT/INR: No results found for this basename: LABPROT,INR in the last 72 hours ABG    Component Value Date/Time   PHART 7.498* 08/17/2012 0447   HCO3 28.1* 08/17/2012 0447   TCO2 29 08/17/2012 0447   O2SAT 99.0 08/17/2012 0447   CBG (last 3)   Basename 09/07/12 0020 09/06/12 1639 09/06/12 0852  GLUCAP 140* 124* 123*    Assessment/Plan: S/P Procedure(s) (LRB): LEFT HEART CATHETERIZATION WITH CORONARY ANGIOGRAM (N/A) TEMPORARY PACEMAKER INSERTION (Right) PERCUTANEOUS CORONARY STENT INTERVENTION (PCI-S) (Right)  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 Dispo- patient is stable  from our standpoint and can be discharged.  He will need to follow up with his Oncologist in [place]   LOS: 25 days    BARRETT, ERIN 09/07/2012   Agree with above. CXR looks stable.

## 2012-09-07 NOTE — Progress Notes (Signed)
Cardiac Rehab [phone] Completed MI, stent and CHF education with pt and wife. Discussed smoking cessation with pt. Gave him tips for quitting and contact numbers for coaching lines. He agrees to [employer]. CRP in [place], will send referral.

## 2012-09-07 NOTE — Progress Notes (Signed)
Subjective: No complaints Has been fitted for life vest but battery charger will be changed prior to discharge after 1230  Objective: Vital signs in last 24 hours: Temp:  [97.6 F (36.4 C)-98.3 F (36.8 C)] 97.6 F (36.4 C) (10/17 0530) Pulse Rate:  [60-66] 60  (10/17 0530) Resp:  [20] 20  (10/17 0530) BP: (92-94)/(54-56) 92/54 mmHg (10/17 0530) SpO2:  [95 %-98 %] 98 % (10/17 0530) Weight:  [98.204 kg (216 lb 8 oz)] 98.204 kg (216 lb 8 oz) (10/17 0530) Weight change:  Last BM Date: 09/06/12 Intake/Output from previous day: -435 10/16 0701 - 10/17 0700 In: 240 [P.O.:240] Out: 675 [Urine:675] Intake/Output this shift:    PE: General:alert and oriented, pleasant affect, anxious about discharge Heart:S1S2 RRR Lungs:few crackles on the rt. Abd:+ BS, soft, non tender Ext:no edema, 2 + pedal pulses    Lab Results:  Basename 09/07/12 0530 09/06/12 0515  WBC 7.6 5.5  HGB 12.6* 11.5*  HCT 36.7* 34.3*  PLT 300 304   BMET  Basename 09/07/12 0530 09/06/12 0515  NA 130* 130*  K 3.9 3.5  CL 91* 91*  CO2 32 33*  GLUCOSE 100* 104*  BUN 11 10  CREATININE 0.71 0.62  CALCIUM 8.6 8.2*   No results found for this basename: TROPONINI:2,CK,MB:2 in the last 72 hours  Lab Results  Component Value Date   CHOL 156 08/22/2012   HDL 35* 08/16/2012   LDLCALC 74 08/16/2012   TRIG 70 09/04/2012   CHOLHDL 3.7 08/16/2012   Lab Results  Component Value Date   HGBA1C 5.9* 08/13/2012     Lab Results  Component Value Date   TSH 11.427* 08/28/2012    Hepatic Function Panel  Basename 09/07/12 0530  PROT 6.3  ALBUMIN 2.3*  AST 24  ALT 25  ALKPHOS 83  BILITOT 0.4  BILIDIR --  IBILI --      EKG: Orders placed during the hospital encounter of 08/13/12  . EKG 12-LEAD  . EKG 12-LEAD  . EKG 12-LEAD  . EKG 12-LEAD  . EKG 12-LEAD  . EKG 12-LEAD  . EKG 12-LEAD  . EKG 12-LEAD  . EKG 12-LEAD  . EKG 12-LEAD  . EKG  . EKG 12-LEAD  . EKG 12-LEAD  . EKG 12-LEAD  . EKG 12-LEAD    Studies/Results: Dg Chest 2 View  09/07/2012  *RADIOLOGY REPORT*  Clinical Data: Post chest tube removal, chylothorax  CHEST - 2 VIEW  Comparison: 09/06/2012  Findings: Moderate right pleural effusion with associated consolidation, likely compressive atelectasis, similar to prior. Right pneumothorax is similar to prior measuring up to 1.7 cm from the superior chest wall to the lung apex.  The left lung remains predominately clear. Cardiomediastinal contours are unchanged with aortic atherosclerosis and tortuosity.  There is a left-sided PICC line with tip projecting over the proximal SVC.  No interval osseous change.  IMPRESSION: Right hydropneumothorax is similar to prior.   Original Report Authenticated By: Waneta Martins, M.D.    Dg Chest 2 View  09/06/2012  *RADIOLOGY REPORT*  Clinical Data: Chest tube removal.  CHEST - 2 VIEW  Comparison: 09/05/2012.  Findings: Trachea is midline.  Heart size stable.  Left PICC tip projects over the SVC.  Right chest tube has been removed.  Small right apical pneumothorax is stable.  Radiation fibrosis and volume loss in the medial right hemithorax and small right pleural effusion are stable.  Left lung is clear. L1 compression fracture is stable.  IMPRESSION:  1.  Small right hydropneumothorax after removal of right chest tube, stable. 2.  Radiation changes and volume loss in the medial right hemithorax.   Original Report Authenticated By: Reyes Ivan, M.D.     Medications: I have reviewed the patient's current medications.    Marland Kitchen amiodarone  400 mg Oral BID  . aspirin EC  81 mg Oral Daily  . feeding supplement (VITAL 1.0 CAL)  237 mL Oral BID BM  . lidocaine  1 patch Transdermal Q24H  . lisinopril  2.5 mg Oral Daily  . magnesium oxide  400 mg Per NG tube BID  . metoprolol tartrate  25 mg Oral BID  . multivitamin with minerals  1 tablet Oral Daily  . pantoprazole  40 mg Oral Q0600  . potassium chloride  40 mEq Oral Once  . sodium chloride   10-40 mL Intracatheter Q12H  . Ticagrelor  90 mg Oral BID  . DISCONTD: insulin aspart  0-15 Units Subcutaneous Q8H   Assessment/Plan: Principal Problem:  *Sustained ventricular tachycardia with associated syncope; Monomorphic Active Problems:  Hx of cancer of lung, 3 years ago treated with radiation and chemo   GERD (gastroesophageal reflux disease)  Hyperglycemia, pt has been on meds for diabetes in the past  Cardiomyopathy, ischemic, by cardiac cath 30-35% 08/13/12   CAD (coronary artery disease) 3 vessel disease  S/P coronary artery stent placement, acutely to RCA for acute Post. 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 EP last note was to d/c on 400 mg amiodarone daily, but new [facility] note for 400 BID until 09/11/12, then 600 mg daily for one month and then 400 mg daily.  He has had no further episodes of NSVT since the 10th.  TSH last check 11- most likely from amiodarone.    Life vest until decision made concerning cancer- will need to follow up with Dr. Doylene Canning in [place].  He will have to come off vest for the studies, briefly.   Chest tube out yesterday-stable Rt sided hydropneumothorax.    BP low in the 90's with dizziness upon ambulation. Prob. D/C home today. Pt can be seen PRN in [place] with [facility], Dr. Heber [place].    Follow up with [facility] for cardiology.   LOS: 25 days   [name],[name] 09/07/2012, 8:02 AM    Patient seen and examined. Agree with assessment and plan. Feels well. For DC today. Would send home on amiodarone 600 mg daily for several weeks then reduce  to 400 mg daily when seen in office. No recurrent chest pain or arrythmia.   Lennette Bihari, MD, [facility] 09/07/2012 1:01 PM

## 2012-09-07 NOTE — Discharge Summary (Signed)
Physician Discharge Summary  Patient ID: [name] [name] MRN: [id number] DOB/AGE: January 30, 1956 56 y.o.  Admit date: 08/13/2012 Discharge date: 09/07/2012  Discharge Diagnoses:  Principal Problem:  *STEMI (ST elevation myocardial infarction), with Stent to RCA, BMS 08/13/12 Active Problems:  Sustained ventricular tachycardia with associated syncope; Monomorphic  Hx of cancer of lung, 3 years ago treated with radiation and chemo , question of return, to follow up with oncology  GERD (gastroesophageal reflux disease)  Hyperglycemia, pt has been on meds for diabetes in the past  Cardiomyopathy, ischemic, by cardiac cath 30-35% 08/13/12   CAD (coronary artery disease) 3 vessel disease  Pacemaker, temporary placed 08/13/12, now resolved  Hypoxemia  Acute respiratory failure, Intubated 08/15/12 -extubated 08/19/12  Pneumonia, organism unspecified  Shock - on admission 08/13/12  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 [name] and placement of T. Pacemaker to override V. Tach, pacer has been d/c'd  08/13/2012  PCI with bare metal stent to RCA  08/15/12 Intubation by Dr. Molli Knock, Extubated 08/15/2012  08/15/12 Chest tube insertion Dr. Molli Knock and rt. Sided Thoracentesis.   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 EMS called by the patient and he was found to be in SR initially then converted to sustained V. Tach. Originally given adenosine without change in rhythm, Then a total of 2-150 mg boluses of Amiodarone.the patient was conscious through out Ride to hospital. He also rec'd 4  baby asa.  Mild chest discomfort. No other associated symptoms except dizziness while in V tach. In ER he rec'd another 150 mg of amio for sustained V. Tach. And IV Lopressor and 4,000 units of Heparin.  EKG appears true post. MI, STEMI. History of Lung cancer 3 years ago treated with 33 treatments of radiation and chemo as well. He was told he had a mass above his heart.   Pt was Taken emergently to the cath lab for ST elevation MI and underwent cardiac catheterization revealing significant coronary disease.  Temporary pacemaker was placed secondary to continued runs of ventricular tachycardia.  Patient had 70% proximal circumflex stenosis and 50% mid, RCA with 70% proximal 50% mid and acute occlusion past the crux with TIMI 0 flow undergoing angioplasty and stent with her bare metal vision stent to the RCA. EF was 30-35%.  The pacemaker was left in for several days.  Patient was stable overnight with IV amiodarone by the next morning he had sustained ventricular tachycardia, and torsades.   A Code Blue was called and he was defibrillated times two. IV magnesium was also given In addition to IV amiodarone bolus, lidocaine bolus and increased pacemaker rate.  Also IV Lopressor was given.  Dr. Ladona Ridgel with BP evaluated the patient as well.  He agreed with current therapy and agreed to follow along.  Critical care medicine was also consulted and was available After the CT of his chest was completed.  There was concern he had return of his lung cancer with an abnormal chest x-ray.  CT of the chest was complained on 924 and a large right sided pleural effusion diffuse infiltrate and a  question of bronchus intermedius obstructive lesion. Patient was intubated for centesis and chest tube were placed and plans were to treat with broad-spectrum antibiotics. Patient developed CV collapse during the CT And IV Levophed was also initiated.  For the next 24 hours the Levophed and the lidocaine were weaned and  d/c'd.  Over the next several days patient continued to improve With eventual extubation on 08/19/2012.  He had no recurrence of ventricular tachycardia during this time. Temporary pacemaker was DC 08/18/2012.  Consult with oncologist Dr. Darnelle Catalan was obtained he Was not convinced that the patient's cancer has recurred. He recommended chest CT with contrast and PET scan. During this hospital stay the patient has refused PET scan he prefers to followup with Dr.Choksi in [place].  Dr. Darnelle Catalan  obtained an appointment for the patient. Patient was also counseled on smoking cessation. Patient had been given a disc with a CT scan so Dr.Choksi can compare his films and decide whether PET scan or other evaluation is called for.  Patient began ambulating by October 1 CT continued to drain greater than 300 mL a day he was very milky in appearance.  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   Dr. Lavinia Sharps was consult and CT scan of the chest was performed without IV contrast and this showed some right hilar fullness although it is difficult to tell if there is adenopathy due to perihilar pulmonary consolidation and the lack of contrast. There was also a new moderate right pleural effusion and a small left pleural effusion. The patient had a right chest tube inserted by pulmonary medicine which drained milky fluid. The fluid had an elevated triglyceride level consistent with chylothorax. The chest tube drained 1300 cc initially on 08/15/2012 and then this drained 350-550 cc daily since. Upon Dr. Erin Fulling recommendations patient was made n.p.o. And TPN Was started. He also discussed that at times it would require surgery for thoracic duct ligation and pleurectomy. Over the next several days to weeks Patient was n.p.o. To a gradual decline in the amount of chest tube drainage.  By October 7 patient again had sustained ventricular tachycardia at 2:30 in the morning lasting 72  seconds.  His amiodarone by mouth dose was increased.  Since that sustained ventricular tachycardia he had short bursts of nonsustained ventricular tachycardia on October 8 and then occasional couplets that time.  Patient's diet was resumed and he had increased amount of chest tube drainage.  By October 11 his chest x-ray looked good with complete drainage of the pleural space plans were for talc pleurodesis. This was completed on October 11.  Chest tube drainage began decreasing.  During this time frame patient was ambulating with cardiac rehabilitation.  Patient continued to improve with decreasing amounts of chest tube drainage until 09/06/2012. Minimal amount of drainage the patient was seen by Dr. Lavinia Sharps, it was felt safe to remove the chest tube.  Chest tube was removed without complications. By the next morning patient had no complaints and was ready for discharge home.  He continues with mild dizziness with ambulation to to borderline blood pressure.  His amiodarone was decreased to 600 mg daily per Dr. [name] at discharge.  This life vest was fitted the day prior to discharge and he had a good understanding of how the life vest works.  Patient will need follow-up with Dr. Doylene Canning in [place].  If cancer has returned and he has < 1 year prognosis of survival he may not be a  candidate for ICD.  But if no recurrence he will ICD if EF not improved in 2 months.  Dr. [name] will follow EF and dosing of Amiodarone.  600mg  for 4 weeks then decrease to 400mg  daily-  Which will be evaluated in the office.      Consults: cardiology, pulmonary/intensive care and vascular surgery. EP with Dr. Ladona Ridgel, Oncology with Dr. Darnelle Catalan Cardiac rehabilitation, nutrition, and pharmacy  Significant Diagnostic Studies:   At discharge TSH 16.116  free T4 0.76  free T3-2.0 With decrease in amiodarone today these may titrate down BMET    Component Value Date/Time   NA 130* 09/07/2012 0530   K 3.9 09/07/2012 0530    CL 91* 09/07/2012 0530   CO2 32 09/07/2012 0530   GLUCOSE 100* 09/07/2012 0530   BUN 11 09/07/2012 0530   CREATININE 0.71 09/07/2012 0530   CALCIUM 8.6 09/07/2012 0530   GFRNONAA >90 09/07/2012 0530   GFRAA >90 09/07/2012 0530    CBC    Component Value Date/Time   WBC 7.6 09/07/2012 0530   RBC 4.15* 09/07/2012 0530   HGB 12.6* 09/07/2012 0530   HCT 36.7* 09/07/2012 0530   PLT 300 09/07/2012 0530   MCV 88.4 09/07/2012 0530   MCH 30.4 09/07/2012 0530   MCHC 34.3 09/07/2012 0530   RDW 13.4 09/07/2012 0530   LYMPHSABS 1.0 09/07/2012 0530   MONOABS 0.9 09/07/2012 0530   EOSABS 0.8* 09/07/2012 0530   BASOSABS 0.1 09/07/2012 0530    Peak CK 173 peak MB 5.3 troponin I less than 0.30  2-D echo: Left ventricle: The cavity size was normal. Wall thickness was normal. Systolic function was severely reduced. The estimated ejection fraction was in the range of 25% to 30%. There is severe hypokinesis of the anterior, anteroseptal and apical walls with a "hinge-point" suggestive of LAD territory ischemia/infarct. There ismoderate inferior hypokinesis and mild hypokinesis of the lateral wall. The study is not technically sufficient to allow evaluation of LV diastolic function. - Mitral valve: Calcified annulus. Trivial regurgitation. - Left atrium: The atrium was normal in size. - Right ventricle: Pacer wire or catheter noted in right ventricle. - Right atrium: Pacer wire or catheter noted in right atrium. - Systemic veins: The IVC measures <2.1 cm but does not collapse >50%, suggesting an elevated    CT CHEST WITH CONTRAST  Technique: Multidetector CT imaging of the chest was performed  following the standard protocol during bolus administration of  intravenous contrast.  Contrast: 80mL OMNIPAQUE IOHEXOL 300 MG/ML SOLN  Comparison: Most recent prior chest CT 08/15/2012  Findings:  Mediastinum: Nonspecific sub centimeter thyroid nodules. Coarse  calcification in the  posterior left thyroid lobe is unchanged  compared to most recent prior exam. Avidly enhancing high left  paratracheal lymph node (just posterior to the left clavicular head  on image 10 of series 2) measures 12 x 19 mm and in diameter and  demonstrates central hypoattenuation consistent with necrosis.  This structure appears slightly enlarged compared to 9 mm on  08/15/2012. No additional suspicious mediastinal adenopathy. Focal  bronchial wall thickening versus prominent lymph node tissue  tracking along the right mainstem bronchus into the proximal right  upper, middle and lower lobe bronchi.  Heart/Vascular: Conventional three-vessel arch. Scattered  atherosclerotic vascular calcification without aneurysmal  dilatation. Atherosclerotic calcifications noted the left anterior  descending, circumflex and right coronary arteries. Heart is  within normal limits for size. No pericardial effusion.  Lungs/Pleura: Interval resolution of bilateral pleural effusions.  Right-sided  thoracostomy tube is in place and is directed  posteriorly and apically. Small right-sided pneumothorax. Pleural  air collection medially, apically and sub pulmonically. Changes of  radiation fibrosis noted in the medial bilateral upper and lower  lobes. No new pulmonary nodule.  Upper Abdomen: Left renal cysts appear similar to prior. No focal  hepatic lesion identified. Unremarkable visualized portions of the  adrenal glands. Essentially unremarkable upper abdomen.  Bones: No acute fracture or aggressive appearing lytic or blastic  osseous lesion. Mild multilevel degenerative disc disease.  IMPRESSION:  1. Avidly enhancing, centrally necrotic high left paratracheal  lymph node just posterior to the left clavicular head is concerning  for nodal recurrence. No additional mediastinal, or hilar  adenopathy identified. This node is potentially amendable to  ultrasound-guided fine needle aspiration, although the location   deep in the thoracic inlet and posterior to the clavicle will be  limiting. Additionally, core biopsy may not be possible given the  surrounding vascular structures.  2. Prominent bronchial wall thickening versus lymphoid tissue  along the right mainstem bronchus and proximal right upper, middle  and lower lobe bronchi is not avidly enhancing, and is not appear  significantly different compared to 08/15/2012. This likely  represents residual treated disease/post radiation changes.  3. Interval resolution of bilateral pleural effusions.  4. Right thoracostomy tube in place. There is a persistent small  right-sided pneumothorax with pleural air apically, medially and  subpulmonically.  4. Multiple small thyroid nodules are nonspecific by CT. One  nodule in the posterior left thyroid lobe is associated with some  coarse calcification which is unchanged compared to 08/15/2012 but  new compared to 12/31/2008. If clinically warranted, ultrasound  could further evaluate.  5. Mild radiation fibrosis in the medial aspects of the bilateral  upper and lower lobes.  6. Atherosclerotic vascular disease including coronary artery  disease.   Chest x-ray 09/07/2012 CHEST - 2 VIEW  Comparison: 09/06/2012  Findings: Moderate right pleural effusion with associated  consolidation, likely compressive atelectasis, similar to prior.  Right pneumothorax is similar to prior measuring up to 1.7 cm from  the superior chest wall to the lung apex. The left lung remains  predominately clear. Cardiomediastinal contours are unchanged with  aortic atherosclerosis and tortuosity. There is a left-sided PICC  line with tip projecting over the proximal SVC. No interval  osseous change.  IMPRESSION:  Right hydropneumothorax is similar to prior.   Body fluid:        Component  Value    Specimen Description  PLEURAL RIGHT FLUID    Special Requests  NONE    Gram Stain  WBC PRESENT,BOTH PMN AND MONONUCLEAR NO  ORGANISMS SEEN CYTOSPIN    Culture  NO GROWTH 3 DAYS    Report Status  08/24/2012 FINAL    Resulting Agency  [facility]     Specimen Collected: 08/20/12 11:46 AM  Last Resulted: 08/24/12 11:00 AM                Discharge Exam: Blood pressure 95/65, pulse 65, temperature 97.8 F (36.6 C), temperature source Oral, resp. rate 18, height 6' (1.829 m), weight 98.204 kg (216 lb 8 oz), SpO2 98.00%.   General:alert and oriented, pleasant affect, anxious about discharge  Heart:S1S2 RRR  Lungs:few crackles on the rt.  Abd:+ BS, soft, non tender  Ext:no edema, 2 + pedal pulses  Disposition: Home      Discharge Orders    Future Orders Please Complete By Expires   Amb  Referral to Cardiac Rehabilitation      Comments:   Pt agrees to Outpt. CRP in [place], will send referral.       Medication List     As of 09/07/2012  3:01 PM    STOP taking these medications         ibuprofen 200 MG tablet   Commonly known as: ADVIL,MOTRIN      omeprazole 20 MG capsule   Commonly known as: PRILOSEC      TAKE these medications         acetaminophen 325 MG tablet   Commonly known as: TYLENOL   Take 2 tablets (650 mg total) by mouth every 4 (four) hours as needed.      amiodarone 200 MG tablet   Commonly known as: PACERONE   Take 3 tablets (600 mg total) by mouth daily.      aspirin 81 MG EC tablet   Take 1 tablet (81 mg total) by mouth daily.      diphenhydrAMINE 25 mg capsule   Commonly known as: BENADRYL   Take 1 capsule (25 mg total) by mouth at bedtime as needed for sleep.      feeding supplement (VITAL 1.0 CAL) Liqd   Take 237 mLs by mouth 2 (two) times daily between meals.      lidocaine 5 %   Commonly known as: LIDODERM   Place 1 patch onto the skin daily. Remove & Discard patch within 12 hours or as directed by MD      lisinopril 2.5 MG tablet   Commonly known as: PRINIVIL,ZESTRIL   Take 1 tablet (2.5 mg total) by mouth daily.      magnesium oxide 400 (241.3 MG) MG  tablet   Commonly known as: MAG-OX   Take 1 tablet (400 mg total) by mouth 2 (two) times daily.      metoprolol tartrate 25 MG tablet   Commonly known as: LOPRESSOR   Take 1 tablet (25 mg total) by mouth 2 (two) times daily.      multivitamin with minerals Tabs   Take 1 tablet by mouth daily.      Oxycodone HCl 10 MG Tabs   Take 1 tablet (10 mg total) by mouth every 3 (three) hours as needed.      pantoprazole 40 MG tablet   Commonly known as: PROTONIX   Take 1 tablet (40 mg total) by mouth daily at 6 (six) AM.      Ticagrelor 90 MG Tabs tablet   Commonly known as: BRILINTA   Take 1 tablet (90 mg total) by mouth 2 (two) times daily.      traMADol 50 MG tablet   Commonly known as: ULTRAM   Take 1 tablet (50 mg total) by mouth every 6 (six) hours as needed.         Follow-up Information    Follow up with Lennette Bihari, MD. On 09/14/2012. (at 3:30pm  cardiology )    Contact information:   [address] [place] [address] [phone]       Follow up with [facility], MD. On 09/20/2012. (at 9:30 am)    Contact information:   [address] [place] [address] [phone]       Follow up with Max Fickle, MD. (call if needed for pulmonary issues, increased Shortness of breath)    Contact information:   [address] [place] [address] [place] [address] [phone]  Discharge Instructions; Weigh daily, if you weight increased by more than 3 pounds a day or 5 pounds in a week, call Dr. Landry Dyke office for instructions  Low salt, low fat diet  No driving  No strenuous activity   Wear  Life Vest     Stop smoking   This Discharge took 60 min. Including MD time. SignedLeone Brand 09/07/2012, 3:01 PM

## 2012-09-07 NOTE — Progress Notes (Signed)
Subjective: No new c/o. CT is out   Objective:  Intake/Output from previous day: 10/16 0701 - 10/17 0700 In: 240 [P.O.:240] Out: 675 [Urine:675]   Physical Exam: Vital signs in last 24 hours:  BP 92/54  Pulse 60  Temp 97.6 F (36.4 C) (Oral)  Resp 20  Ht 6' (1.829 m)  Wt 216 lb 8 oz (98.204 kg)  BMI 29.36 kg/m2  SpO2 98%  General: WD male in NAD, OOB in chair  Neuro: awake, alert, appropriate CV: s1s2 rrr PULM: resps even non labored, Chest with few crackles on right   cloudy amber drainage. GI: abd soft, +bs Extremities: warm and dry, no edema    Lab Results:  Basename 09/07/12 0530 09/06/12 0515 09/05/12 0500  WBC 7.6 5.5 7.4  HGB 12.6* 11.5* 12.2*  HCT 36.7* 34.3* 35.6*  PLT 300 304 323   BMET  Basename 09/07/12 0530 09/06/12 0515 09/05/12 0500  NA 130* 130* 128*  K 3.9 3.5 3.6  CL 91* 91* 88*  CO2 32 33* 32  GLUCOSE 100* 104* 118*  BUN 11 10 11   CREATININE 0.71 0.62 0.59  CALCIUM 8.6 8.2* 8.4    Studies/Results: Dg Chest 2 View  09/07/2012  *RADIOLOGY REPORT*  Clinical Data: Post chest tube removal, chylothorax  CHEST - 2 VIEW  Comparison: 09/06/2012  Findings: Moderate right pleural effusion with associated consolidation, likely compressive atelectasis, similar to prior. Right pneumothorax is similar to prior measuring up to 1.7 cm from the superior chest wall to the lung apex.  The left lung remains predominately clear. Cardiomediastinal contours are unchanged with aortic atherosclerosis and tortuosity.  There is a left-sided PICC line with tip projecting over the proximal SVC.  No interval osseous change.  IMPRESSION: Right hydropneumothorax is similar to prior.   Original Report Authenticated By: Waneta Martins, M.D.    Dg Chest 2 View  09/06/2012  *RADIOLOGY REPORT*  Clinical Data: Chest tube removal.  CHEST - 2 VIEW  Comparison: 09/05/2012.  Findings: Trachea is midline.  Heart size stable.  Left PICC tip projects over the SVC.  Right chest  tube has been removed.  Small right apical pneumothorax is stable.  Radiation fibrosis and volume loss in the medial right hemithorax and small right pleural effusion are stable.  Left lung is clear. L1 compression fracture is stable.  IMPRESSION:  1.  Small right hydropneumothorax after removal of right chest tube, stable. 2.  Radiation changes and volume loss in the medial right hemithorax.   Original Report Authenticated By: Reyes Ivan, M.D.     Principal Problem:  *Sustained ventricular tachycardia with associated syncope; Monomorphic Active Problems:  Hx of cancer of lung, 3 years ago treated with radiation and chemo   GERD (gastroesophageal reflux disease)  Hyperglycemia, pt has been on meds for diabetes in the past  Cardiomyopathy, ischemic, by cardiac cath 30-35% 08/13/12   CAD (coronary artery disease) 3 vessel disease  S/P coronary artery stent placement, acutely to RCA for acute Post. 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 CXR is stable with hydroPTX unchanged     10/9 repeat plural fluid triglycerides 10/10 increased  drainage from chest tube(360 cc) and cloudy.  10/11 170 ccTalc pleurodesis planned per  per CVTS- see note. 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  CT Scans here reviewed w/ ?regarding a hi left paratrach LN; We do not have any of the [facility] records & he needs f/u w/  Oncology ASAP but refuses PET/onc f/u here.    Dispo -- d/c home per cardiology F/u in [place] with oncology  Heber Morrow, our partner, has a clinic in [place]. He can be seen there prn.  No appt was scheduled. Pt has our card.  Shan Levans, MD  Beeper  [phone]  Cell  [phone]  If no response or cell goes to voicemail, call beeper [phone]  09/07/2012  8:37 AM

## 2012-09-11 LAB — FUNGUS CULTURE W SMEAR: Fungal Smear: NONE SEEN

## 2012-09-20 ENCOUNTER — Ambulatory Visit: Payer: Self-pay | Admitting: Oncology

## 2012-09-20 LAB — CBC CANCER CENTER
Basophil #: 0.1 x10 3/mm (ref 0.0–0.1)
Basophil %: 0.6 %
Eosinophil %: 1.5 %
HCT: 43 % (ref 40.0–52.0)
HGB: 14.1 g/dL (ref 13.0–18.0)
Lymphocyte %: 6.9 %
MCH: 29.6 pg (ref 26.0–34.0)
MCV: 91 fL (ref 80–100)
Neutrophil %: 81.2 %
RBC: 4.74 10*6/uL (ref 4.40–5.90)

## 2012-09-20 LAB — COMPREHENSIVE METABOLIC PANEL
Anion Gap: 12 (ref 7–16)
Calcium, Total: 9.3 mg/dL (ref 8.5–10.1)
Chloride: 92 mmol/L — ABNORMAL LOW (ref 98–107)
Co2: 27 mmol/L (ref 21–32)
Creatinine: 1.07 mg/dL (ref 0.60–1.30)
EGFR (African American): >60
EGFR (Non-African Amer.): >60
Potassium: 4.4 mmol/L (ref 3.5–5.1)
SGPT (ALT): 24 U/L (ref 12–78)
Sodium: 131 mmol/L — ABNORMAL LOW (ref 136–145)
Total Protein: 8.4 g/dL — ABNORMAL HIGH (ref 6.4–8.2)

## 2012-09-22 ENCOUNTER — Ambulatory Visit: Payer: Self-pay | Admitting: Oncology

## 2012-09-27 LAB — AFB CULTURE WITH SMEAR (NOT AT ARMC)

## 2012-11-23 ENCOUNTER — Ambulatory Visit: Payer: Self-pay | Admitting: Oncology

## 2012-11-27 ENCOUNTER — Ambulatory Visit: Payer: Self-pay | Admitting: Oncology

## 2012-12-08 ENCOUNTER — Ambulatory Visit
Admission: RE | Admit: 2012-12-08 | Discharge: 2012-12-08 | Disposition: A | Discharge status: Home / Self Care | Payer: Commercial Managed Care - PPO | Source: Ambulatory Visit | Attending: Cardiovascular Disease | Admitting: Cardiovascular Disease

## 2012-12-08 ENCOUNTER — Other Ambulatory Visit: Payer: Self-pay | Admitting: Cardiovascular Disease

## 2012-12-08 DIAGNOSIS — J90 Pleural effusion, not elsewhere classified: Secondary | ICD-10-CM

## 2012-12-13 ENCOUNTER — Other Ambulatory Visit (HOSPITAL_COMMUNITY): Payer: Self-pay | Admitting: Cardiovascular Disease

## 2012-12-13 DIAGNOSIS — I252 Old myocardial infarction: Secondary | ICD-10-CM

## 2012-12-23 ENCOUNTER — Ambulatory Visit: Payer: Self-pay | Admitting: Oncology

## 2013-01-17 ENCOUNTER — Ambulatory Visit (HOSPITAL_COMMUNITY)
Admission: RE | Admit: 2013-01-17 | Discharge: 2013-01-17 | Disposition: A | Discharge status: Home / Self Care | Payer: Commercial Managed Care - PPO | Source: Ambulatory Visit | Attending: Cardiovascular Disease | Admitting: Cardiovascular Disease

## 2013-01-17 DIAGNOSIS — I369 Nonrheumatic tricuspid valve disorder, unspecified: Secondary | ICD-10-CM | POA: Insufficient documentation

## 2013-01-17 DIAGNOSIS — I252 Old myocardial infarction: Secondary | ICD-10-CM

## 2013-01-17 DIAGNOSIS — I359 Nonrheumatic aortic valve disorder, unspecified: Secondary | ICD-10-CM | POA: Insufficient documentation

## 2013-01-17 DIAGNOSIS — Z9289 Personal history of other medical treatment: Secondary | ICD-10-CM

## 2013-01-17 HISTORY — DX: Personal history of other medical treatment: Z92.89

## 2013-01-17 NOTE — Progress Notes (Signed)
2D Echo Performed 01/17/2013    Clearence Ped, RCS

## 2013-01-20 ENCOUNTER — Ambulatory Visit: Payer: Self-pay | Admitting: Oncology

## 2013-02-19 LAB — CBC CANCER CENTER
HCT: 42.6 % (ref 40.0–52.0)
HGB: 14.3 g/dL (ref 13.0–18.0)
Lymphocyte #: 1.4 x10 3/mm (ref 1.0–3.6)
MCH: 31.3 pg (ref 26.0–34.0)
MCHC: 33.6 g/dL (ref 32.0–36.0)
MCV: 93 fL (ref 80–100)
Monocyte %: 11.8 %
Neutrophil %: 64.1 %
RDW: 14.6 % — ABNORMAL HIGH (ref 11.5–14.5)

## 2013-02-19 LAB — COMPREHENSIVE METABOLIC PANEL
Albumin: 3.8 g/dL (ref 3.4–5.0)
Alkaline Phosphatase: 89 U/L (ref 50–136)
Anion Gap: 6 — ABNORMAL LOW (ref 7–16)
Bilirubin,Total: 0.5 mg/dL (ref 0.2–1.0)
Calcium, Total: 9 mg/dL (ref 8.5–10.1)
Chloride: 99 mmol/L (ref 98–107)
Creatinine: 1.18 mg/dL (ref 0.60–1.30)
EGFR (African American): >60
Osmolality: 278 (ref 275–301)
Potassium: 5 mmol/L (ref 3.5–5.1)
SGOT(AST): 27 U/L (ref 15–37)
SGPT (ALT): 35 U/L (ref 12–78)
Sodium: 138 mmol/L (ref 136–145)
Total Protein: 7.6 g/dL (ref 6.4–8.2)

## 2013-02-20 ENCOUNTER — Ambulatory Visit: Payer: Self-pay | Admitting: Oncology

## 2013-02-22 ENCOUNTER — Other Ambulatory Visit: Payer: Self-pay | Admitting: *Deleted

## 2013-02-22 DIAGNOSIS — I509 Heart failure, unspecified: Secondary | ICD-10-CM

## 2013-03-06 ENCOUNTER — Encounter (HOSPITAL_COMMUNITY): Payer: Self-pay

## 2013-03-14 MED ORDER — CEFAZOLIN SODIUM-DEXTROSE 2-3 GM-% IV SOLR
2.0000 g | INTRAVENOUS | Status: DC
  Filled 2013-03-14 (×2): qty 50

## 2013-03-14 MED ORDER — SODIUM CHLORIDE 0.9 % IR SOLN
80.0000 mg | Status: DC
  Filled 2013-03-14: qty 2

## 2013-03-15 ENCOUNTER — Encounter (HOSPITAL_COMMUNITY): Payer: Self-pay | Admitting: General Practice

## 2013-03-15 ENCOUNTER — Ambulatory Visit (HOSPITAL_COMMUNITY)
Admission: RE | Admit: 2013-03-15 | Discharge: 2013-03-16 | Disposition: A | Discharge status: Home / Self Care | Payer: Commercial Managed Care - PPO | Source: Ambulatory Visit | Attending: Cardiovascular Disease | Admitting: Cardiovascular Disease

## 2013-03-15 ENCOUNTER — Encounter (HOSPITAL_COMMUNITY)
Admission: RE | Disposition: A | Discharge status: Home / Self Care | Payer: Self-pay | Source: Ambulatory Visit | Attending: Cardiovascular Disease

## 2013-03-15 DIAGNOSIS — I509 Heart failure, unspecified: Secondary | ICD-10-CM

## 2013-03-15 DIAGNOSIS — I2589 Other forms of chronic ischemic heart disease: Secondary | ICD-10-CM | POA: Insufficient documentation

## 2013-03-15 DIAGNOSIS — I2510 Atherosclerotic heart disease of native coronary artery without angina pectoris: Secondary | ICD-10-CM | POA: Diagnosis present

## 2013-03-15 DIAGNOSIS — I252 Old myocardial infarction: Secondary | ICD-10-CM | POA: Insufficient documentation

## 2013-03-15 DIAGNOSIS — I255 Ischemic cardiomyopathy: Secondary | ICD-10-CM | POA: Diagnosis present

## 2013-03-15 DIAGNOSIS — Z85118 Personal history of other malignant neoplasm of bronchus and lung: Secondary | ICD-10-CM

## 2013-03-15 HISTORY — PX: IMPLANTABLE CARDIOVERTER DEFIBRILLATOR IMPLANT: SHX5473

## 2013-03-15 HISTORY — DX: Hypothyroidism, unspecified: E03.9

## 2013-03-15 HISTORY — DX: Malignant neoplasm of unspecified part of unspecified bronchus or lung: C34.90

## 2013-03-15 HISTORY — DX: Unspecified osteoarthritis, unspecified site: M19.90

## 2013-03-15 HISTORY — DX: Headache, unspecified: R51.9

## 2013-03-15 HISTORY — DX: Pneumonia, unspecified organism: J18.9

## 2013-03-15 HISTORY — DX: Presence of automatic (implantable) cardiac defibrillator: Z95.810

## 2013-03-15 HISTORY — PX: CARDIAC DEFIBRILLATOR PLACEMENT: SHX171

## 2013-03-15 HISTORY — DX: Headache: R51

## 2013-03-15 LAB — SURGICAL PCR SCREEN: MRSA, PCR: NEGATIVE

## 2013-03-15 SURGERY — IMPLANTABLE CARDIOVERTER DEFIBRILLATOR IMPLANT
Anesthesia: LOCAL

## 2013-03-15 MED ORDER — SODIUM CHLORIDE 0.9 % IV SOLN: INTRAVENOUS | Status: DC

## 2013-03-15 MED ORDER — SODIUM CHLORIDE 0.9 % IV SOLN
INTRAVENOUS | Status: DC
  Administered 2013-03-15: 08:00:00 via INTRAVENOUS

## 2013-03-15 MED ORDER — RAMIPRIL 5 MG PO CAPS
5.0000 mg | ORAL_CAPSULE | Freq: Two times a day (BID) | ORAL | Status: DC
  Administered 2013-03-15 – 2013-03-16 (×2): 5 mg via ORAL
  Filled 2013-03-15 (×3): qty 1

## 2013-03-15 MED ORDER — MIDAZOLAM HCL 5 MG/5ML IJ SOLN
INTRAMUSCULAR | Status: AC
  Filled 2013-03-15: qty 5

## 2013-03-15 MED ORDER — METOPROLOL TARTRATE 25 MG PO TABS
25.0000 mg | ORAL_TABLET | Freq: Two times a day (BID) | ORAL | Status: DC
  Administered 2013-03-15 – 2013-03-16 (×2): 25 mg via ORAL
  Filled 2013-03-15 (×3): qty 1

## 2013-03-15 MED ORDER — ACETAMINOPHEN 325 MG PO TABS: 325.0000 mg | ORAL_TABLET | ORAL | Status: DC | PRN

## 2013-03-15 MED ORDER — MAGNESIUM OXIDE 400 (241.3 MG) MG PO TABS
400.0000 mg | ORAL_TABLET | Freq: Two times a day (BID) | ORAL | Status: DC
  Administered 2013-03-15 – 2013-03-16 (×2): 400 mg via ORAL
  Filled 2013-03-15 (×3): qty 1

## 2013-03-15 MED ORDER — HEPARIN (PORCINE) IN NACL 2-0.9 UNIT/ML-% IJ SOLN
INTRAMUSCULAR | Status: AC
  Filled 2013-03-15: qty 500

## 2013-03-15 MED ORDER — ATORVASTATIN CALCIUM 20 MG PO TABS
20.0000 mg | ORAL_TABLET | Freq: Every day | ORAL | Status: DC
  Administered 2013-03-16: 20 mg via ORAL
  Filled 2013-03-15: qty 1

## 2013-03-15 MED ORDER — LEVOTHYROXINE SODIUM 50 MCG PO TABS
50.0000 ug | ORAL_TABLET | Freq: Every day | ORAL | Status: DC
  Administered 2013-03-16: 50 ug via ORAL
  Filled 2013-03-15 (×2): qty 1

## 2013-03-15 MED ORDER — TICAGRELOR 90 MG PO TABS
90.0000 mg | ORAL_TABLET | Freq: Two times a day (BID) | ORAL | Status: DC
  Administered 2013-03-15 – 2013-03-16 (×2): 90 mg via ORAL
  Filled 2013-03-15 (×3): qty 1

## 2013-03-15 MED ORDER — CEFAZOLIN SODIUM 1-5 GM-% IV SOLN
1.0000 g | Freq: Four times a day (QID) | INTRAVENOUS | Status: AC
  Administered 2013-03-15 – 2013-03-16 (×3): 1 g via INTRAVENOUS
  Filled 2013-03-15 (×3): qty 50

## 2013-03-15 MED ORDER — LIDOCAINE HCL (PF) 1 % IJ SOLN
INTRAMUSCULAR | Status: AC
  Filled 2013-03-15: qty 60

## 2013-03-15 MED ORDER — SODIUM CHLORIDE 0.9 % IV SOLN
INTRAVENOUS | Status: DC
  Administered 2013-03-15: 13:00:00 via INTRAVENOUS

## 2013-03-15 MED ORDER — FENTANYL CITRATE 0.05 MG/ML IJ SOLN
INTRAMUSCULAR | Status: AC
  Filled 2013-03-15: qty 2

## 2013-03-15 MED ORDER — YOU HAVE A PACEMAKER BOOK
Freq: Once | Status: AC
  Administered 2013-03-16: 06:00:00
  Filled 2013-03-15: qty 1

## 2013-03-15 MED ORDER — ADULT MULTIVITAMIN W/MINERALS CH
1.0000 | ORAL_TABLET | Freq: Every day | ORAL | Status: DC
Start: 2013-03-16 — End: 2013-03-16
  Administered 2013-03-16: 1 via ORAL
  Filled 2013-03-15: qty 1

## 2013-03-15 MED ORDER — HYDROCODONE-ACETAMINOPHEN 5-325 MG PO TABS
1.0000 | ORAL_TABLET | ORAL | Status: DC | PRN
  Administered 2013-03-15 – 2013-03-16 (×2): 1 via ORAL
  Filled 2013-03-15 (×2): qty 1

## 2013-03-15 MED ORDER — ASPIRIN EC 81 MG PO TBEC
81.0000 mg | DELAYED_RELEASE_TABLET | Freq: Every day | ORAL | Status: DC
  Administered 2013-03-16: 81 mg via ORAL
  Filled 2013-03-15 (×2): qty 1

## 2013-03-15 MED ORDER — ONDANSETRON HCL 4 MG/2ML IJ SOLN: 4.0000 mg | Freq: Four times a day (QID) | INTRAMUSCULAR | Status: DC | PRN

## 2013-03-15 MED ORDER — AMIODARONE HCL 200 MG PO TABS
200.0000 mg | ORAL_TABLET | Freq: Two times a day (BID) | ORAL | Status: DC
  Administered 2013-03-15 – 2013-03-16 (×2): 200 mg via ORAL
  Filled 2013-03-15 (×4): qty 1

## 2013-03-15 MED ORDER — MUPIROCIN 2 % EX OINT
TOPICAL_OINTMENT | Freq: Two times a day (BID) | CUTANEOUS | Status: DC
  Administered 2013-03-15: 08:00:00 via NASAL
  Filled 2013-03-15: qty 22

## 2013-03-15 MED ORDER — PANTOPRAZOLE SODIUM 40 MG PO TBEC
40.0000 mg | DELAYED_RELEASE_TABLET | Freq: Every day | ORAL | Status: DC
  Administered 2013-03-16: 40 mg via ORAL
  Filled 2013-03-15: qty 1

## 2013-03-15 NOTE — CV Procedure (Signed)
ICD Criteria  Current LVEF (within 6 months):25-30%  NYHA Functional Classification: Class II  Heart Failure History:  Yes, Duration of heart failure since onset is 3 to 9 months  Non-Ischemic Dilated Cardiomyopathy History:  No.  Atrial Fibrillation/Atrial Flutter:  No.  Ventricular Tachycardia History:  No.  Cardiac Arrest History:  No  History of Syndromes with Risk of Sudden Death:  No.  Previous ICD:  No.  Electrophysiology Study: No.  Anticoagulation Therapy:  Patient is NOT on anticoagulation therapy.   Beta Blocker Therapy:  Yes.   Ace Inhibitor/ARB Therapy:  Yes.

## 2013-03-15 NOTE — CV Procedure (Signed)
Procedure report  Procedure performed:  1. Implantation of new single chamber cardioverter defibrillator 2. Fluoroscopy 3. Moderate sedation 4. Initial lead and generator testing  Reason for procedure: Primary prevention of sudden cardiac death Ischemic cardiomyopathy, left ventricular ejection fraction less than 30%, Heart failure NYHA class II, on comprehensive medical therapy for over 90 days (MADIT-II)  Procedure performed by: Thurmon Fair, MD  Complications: None  Estimated blood loss: <10 mL  Medications administered during procedure: Ancef 1 g intravenously Lidocaine 1% 30 mL locally,  Fentanyl 7 mcg intravenously Versed 4 mg intravenously  Device details:  Generator Medtronic Vernon model [id number] serial number [id number] Right ventricular lead Medtronic [id number], serial number [id number]  Procedure details:  After the risks and benefits of the procedure were discussed the patient provided informed consent and was brought to the cardiac cath lab in the fasting state. The patient was prepped and draped in usual sterile fashion. Local anesthesia with 1% lidocaine was administered to to the left infraclavicular area. A 5-6 cm horizontal incision was made parallel with and 2-3 cm caudal to the left clavicle. Using electrocautery and blunt dissection a prepectoral pocket was created down to the level of the pectoralis major muscle fascia. The pocket was carefully inspected for hemostasis. An antibiotic-soaked sponge was placed in the pocket.  Under fluoroscopic guidance and using the modified Seldinger technique a single venipuncture was performed to access the left subclavian vein. No difficulty was encountered accessing the vein.  A J-tip guidewire was subsequently exchanged for a 60F French peel-away sheath.  Under fluoroscopic guidance the ventricular lead was advanced to level of the mid to apical right ventricular septum and the active-fixation helix was deployed.  Moderate current of injury was seen. Satisfactory pacing and sensing parameters were recorded. There was no evidence of diaphragmatic stimulation at maximum device output. The safe sheath was peeled away and the lead was secured in place with 2-0 silk. The antibiotic-soaked sponge was removed from the pocket. The pocket was flushed with copious amounts of antibiotic solution. Reinspection showed excellent hemostasis.  The ventricular lead was connected to the generator and appropriate ventricular pacing was seen. Subsequently the atrial lead was also connected. Repeat testing of the lead parameters later showed excellent values.  The entire system was then carefully inserted in the pocket with care been taking that the leads and device assumed a comfortable position without pressure on the incision. Great care was taken that the leads be located deep to the generator. The pocket was then closed in layers using 2 layers of 2-0 Vicryl and cutaneous staples, after which a sterile dressing was applied.  Defibrillation threshold testing was then performed. After adequate sedation was achieved, ventricular fibrillation was induced with a 1J shock on T method. There was appropriate sensing by the device. There were 4-5 dropouts during "least sensitive" settings. The arrhythmia was terminated by a single 20 J shock. High voltage impedance during the shock was 81ohm. Retesting of the leads confirmed normal function.  At the end of the procedure the following lead parameters were encountered:  Right ventricular lead sensed R waves 12.3 mV, impedance 720 ohms, threshold 0.5 V at 0.5 ms pulse width.  High voltage impedance 81, charge time 4.8 s.  Thurmon Fair, MD, [facility] [phone] office [phone] pager

## 2013-03-15 NOTE — Progress Notes (Signed)
Patient's Pacer/ICD site stain marked on initial assessment, I continued to monitor the site and noticed an increasing amount of drainage on dressing.  I had Chip with the Cath Lab come and assess twice and Dr. Royann Shivers to come and assess once.  No new orders received.

## 2013-03-15 NOTE — H&P (Signed)
  Date of Initial H&P: 02/20/13  History reviewed, patient examined, no change in status, stable for surgery. Primary prevention ICD implantation for ischemic cardiomyopathy (Prior myocardial infarction, left ventricular ejection fraction under 35% for >3 months, heart failure NYHA class II, on comprehensive medical therapy). No history of AFib. On Brilinta for coronary stent <12 months. Not on anticoagulation. This procedure has been fully reviewed with the patient and written informed consent has been obtained. Thurmon Fair, MD, [facility] [phone] office [phone] pager

## 2013-03-16 ENCOUNTER — Ambulatory Visit (HOSPITAL_COMMUNITY): Payer: Commercial Managed Care - PPO

## 2013-03-16 IMAGING — CR DG CHEST 1V PORT
2 series · 2 of 2 positions shown · non-contrast
Comparison: 09/04/2012.

CLINICAL DATA: Follow-up chest tube.

PORTABLE CHEST - 1 VIEW

[AP (1 of 2)]
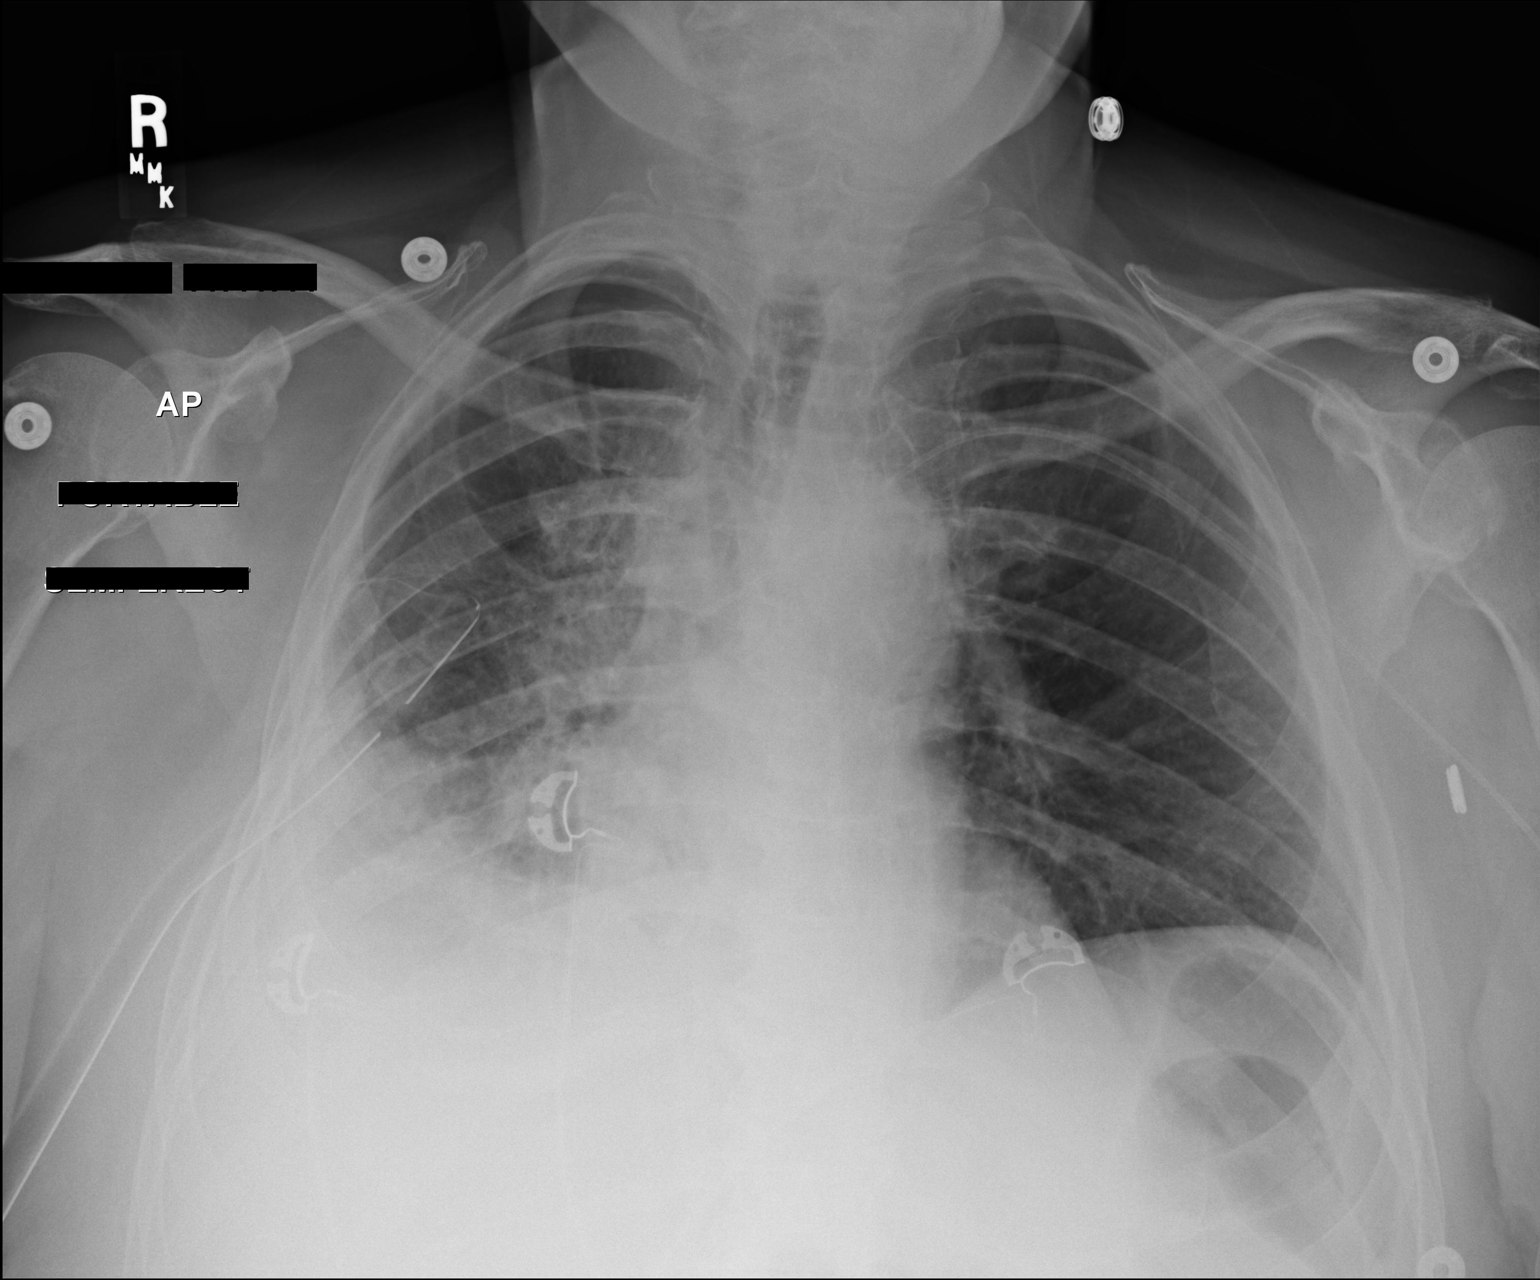

[AP (2 of 2)]
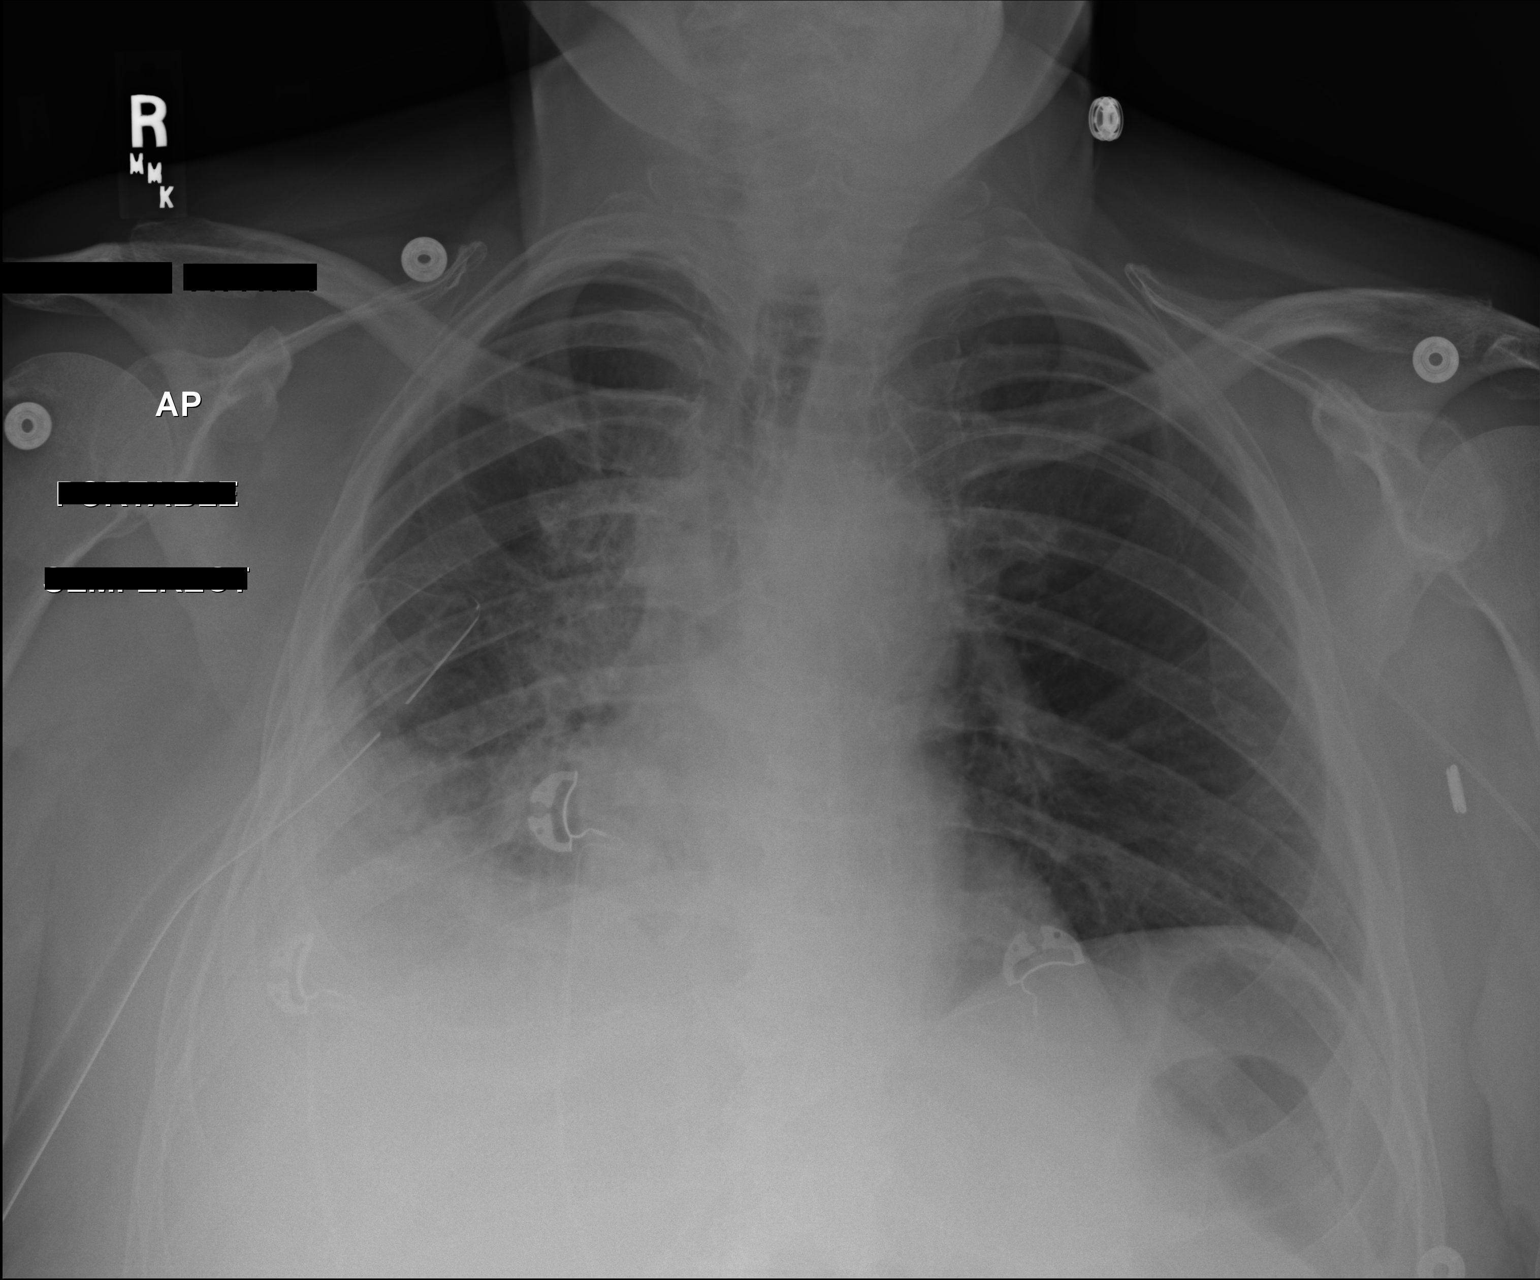

[2 of 2 positions shown; findings below may reference images not displayed]

FINDINGS: The right-sided chest tube is stable.  A small apical
pneumothorax is noted.  The left PICC line tip has been
repositioned.  The tip is at the brachiocephalic SVC junction.  The
heart and lungs are stable.
IMPRESSION: 1.  Repositioning of the left PICC line.  The tip is now in the SVC
at the brachiocephalic SVC junction
2.  Stable right-sided chest tube with small apical pneumothorax.

## 2013-03-16 NOTE — Progress Notes (Signed)
   [facility]  Subjective: No complaints  Objective: Vital signs in last 24 hours: Temp:  [97.3 F (36.3 C)-98.2 F (36.8 C)] 97.9 F (36.6 C) (04/25 0451) Pulse Rate:  [55-58] 55 (04/25 0944) Resp:  [17-18] 18 (04/25 0451) BP: (97-110)/(60-68) 97/60 mmHg (04/25 0944) SpO2:  [95 %-99 %] 95 % (04/25 0451) Last BM Date: 03/16/13  Intake/Output from previous day: 04/24 0701 - 04/25 0700 In: 240 [P.O.:240] Out: -  Intake/Output this shift:    Medications Current Facility-Administered Medications  Medication Dose Route Frequency Provider Last Rate Last Dose  . 0.9 %  sodium chloride infusion   Intravenous Continuous Mihai Croitoru, MD 50 mL/hr at 03/15/13 1306    . acetaminophen (TYLENOL) tablet 325-650 mg  325-650 mg Oral Q4H PRN Mihai Croitoru, MD      . amiodarone (PACERONE) tablet 200 mg  200 mg Oral BID Mihai Croitoru, MD   200 mg at 03/16/13 0945  . aspirin EC tablet 81 mg  81 mg Oral Daily Mihai Croitoru, MD   81 mg at 03/16/13 0945  . atorvastatin (LIPITOR) tablet 20 mg  20 mg Oral Daily Mihai Croitoru, MD   20 mg at 03/16/13 0945  . HYDROcodone-acetaminophen (NORCO/VICODIN) 5-325 MG per tablet 1-2 tablet  1-2 tablet Oral Q4H PRN Thurmon Fair, MD   1 tablet at 03/16/13 0407  . levothyroxine (SYNTHROID, LEVOTHROID) tablet 50 mcg  50 mcg Oral QAC breakfast Thurmon Fair, MD   50 mcg at 03/16/13 0745  . magnesium oxide (MAG-OX) tablet 400 mg  400 mg Oral BID Thurmon Fair, MD   400 mg at 03/16/13 0946  . metoprolol tartrate (LOPRESSOR) tablet 25 mg  25 mg Oral BID Thurmon Fair, MD   25 mg at 03/16/13 0946  . multivitamin with minerals tablet 1 tablet  1 tablet Oral Daily Thurmon Fair, MD   1 tablet at 03/16/13 0946  . mupirocin ointment (BACTROBAN) 2 %   Nasal BID Mihai Croitoru, MD      . ondansetron (ZOFRAN) injection 4 mg  4 mg Intravenous Q6H PRN Mihai Croitoru, MD      . pantoprazole (PROTONIX) EC tablet 40 mg  40 mg Oral Q0600  Mihai Croitoru, MD   40 mg at 03/16/13 6213  . ramipril (ALTACE) capsule 5 mg  5 mg Oral BID Thurmon Fair, MD   5 mg at 03/16/13 0946  . Ticagrelor (BRILINTA) tablet 90 mg  90 mg Oral BID Thurmon Fair, MD   90 mg at 03/16/13 0946    PE: General appearance: alert, cooperative and no distress Lungs: clear to auscultation bilaterally Heart: regular rate and rhythm, S1, S2 normal, no murmur, click, rub or gallop Extremities: No LEE Pulses: 2+ and symmetric, 1+ PT pulses. Skin: Pacer sight with minimal oozing. Neurologic: Grossly normal  Assessment/Plan  Active Problems:   Hx of cancer of lung, 3 years ago treated with radiation and chemo , question of return, to follow up with oncology   Cardiomyopathy, ischemic, by cardiac cath 30-35% 08/13/12    CAD (coronary artery disease) 3 vessel disease  Plan: SP single lead ICD /PPM placement. No pneumothorax.  DC home today.    LOS: 1 day    HAGER, BRYAN 03/16/2013 9:48 AM   Patient seen and examined. Agree with assessment and plan. Doing well s/p single lead ICD. No pneumothorax on cxr. No sob or cp. DC today.   Lennette Bihari, MD, [facility] 03/16/2013 10:24 AM

## 2013-03-16 NOTE — Progress Notes (Signed)
DC orders received.  Patient stable with no S/S of distress.  Medication and discharge information reviewed with patient and patient's wife, emphasizing importance of affected arm restrictions and importance of daily weights for heart failure complication prevention.  Patient DC home with wife. [place], Mitzi Hansen

## 2013-03-16 NOTE — Discharge Summary (Signed)
Physician Discharge Summary  Patient ID: [name] [name] MRN: [id number] DOB/AGE: [birth year] 55 y.o.  Admit date: 03/15/2013 Discharge date: 03/16/2013  Admission Diagnoses:  Cardiomyopathy, Ischemic requiring ICD implant for primary prevention of SCD.  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 Cardiac catheterization performed in September of last year showed a totally occluded LAD artery that was felt to be a chronic abnormality, as well as acutely occluded right coronary artery. The right coronary artery was treated by stenting with a bare metal 2.75 x 28 mm Vision stent. 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 He received radiation and chemotherapy more than 3 years ago and is felt to be in complete remission at this time. He was treated with amiodarone to prevent recurrent ventricular tachycardia and is still on this medication to date. He has been wearing a LifeVest since discharge from the hospital last October and the device has not recorded any arrhythmia. He has been on treatment with maximum tolerated doses of ACE inhibitors and beta blockers as well as continued amiodarone, aspirin, Brilinta, and a statin.  On initial presentation, his left ventricular ejection fraction was estimated to be about 25% to 30%. Repeat evaluation performed recently shows only marginal improvement in left ventricular ejection fraction at 30% to 35%. There is still severe hypokinesis of  the inferior wall and there is evidence of grade 2 diastolic dysfunction with elevated filling pressures.   The patient was admitted for single chamber ICD/PPM placement.  The procedure was completed without complications.  No pneumothorax.  He was discharged home in sable condition after being seen by Dr. [name].  The patient was set up for follow up appt in 7-10 days.  Consults: None  Significant Diagnostic Studies:    CHEST - 2 VIEW Comparison: 12/08/2012  Findings: Stable bilateral paramediastinal radiation changes / fibrosis. Lungs are otherwise essentially clear. No pleural effusion or pneumothorax.  The heart is normal in size.  Left subclavian single lead ICD in satisfactory position.  Degenerative changes of the visualized thoracolumbar spine. IMPRESSION: Left subclavian single lead ICD in satisfactory position. No pneumothorax.   Treatments: ICD/PPM implant   Discharge Exam: Blood pressure 97/60, pulse 55, temperature 97.9 F (36.6 C), temperature source Oral, resp. rate 18, height 6' (1.829 m), weight 220 lb (99.791 kg), SpO2 95.00%.   Disposition: 01-Home or Self Care  Discharge Orders   Future Orders Complete By Expires     Diet - low sodium heart healthy  As directed     Diet - low sodium heart healthy  As directed     Discharge instructions  As directed     Comments:      Supplemental Discharge Instructions for  Pacemaker/Defibrillator Patients  Activity Do not raise your left/right arm above shoulder level or extend it backward beyond shoulder level for 2 weeks. Wear the arm sling as a reminder or as needed for comfort for 2 weeks. No heavy lifting or vigorous activity  with your left/right arm for 6-8 weeks.    NO DRIVING is preferable for 2 weeks; If absolutely necessary, drive only short, familiar routes. DO wear your seatbelt, even if it crosses over the pacemaker site.  WOUND CARE Keep the wound area clean and dry.  Remove the dressing the day after  you return home (usually 48 hours after the procedure). DO NOT SUBMERGE UNDER WATER UNTIL FULLY HEALED (no tub baths, hot tubs, swimming pools, etc.).  You  may shower or take a sponge bath after the dressing is removed. DO NOT SOAK the area and do not allow the shower to directly spray on the site. If you have staples, these will be removed in the office in 7-14 days. If you have tape/steri-strips on your wound, these will fall off; do not pull them off prematurely.   No bandage is needed on the site.  DO  NOT apply any creams, oils, or ointments to the wound area. If you notice any drainage or discharge from the wound, any swelling, excessive redness or bruising at the site, or if you develop a fever > 101? F after you are discharged home, call the office at once.  Special Instructions You are still able to use cellular telephones.  Avoid carrying your cellular phone near your device. When traveling through airports, show security personnel your identification card to avoid being screened in the metal detectors.  Avoid arc welding equipment, MRI testing (magnetic resonance imaging), TENS units (transcutaneous nerve stimulators).  Call the office for questions about other devices. Avoid electrical appliances that are in poor condition or are not properly grounded. Microwave ovens are safe to be near or to operate.  Additional information for defibrillator patients should your device go off: If your device goes off ONCE and you feel fine afterward, notify the clinic at [phone]. If your device goes off ONCE and you do not feel well afterward, call 911. If your device goes off TWICE or more in one day, call 911.  DO NOT DRIVE YOURSELF OR A FAMILY MEMBER WITH A DEFIBRILLATOR TO THE HOSPITAL-CALL 911.    Increase activity slowly  As directed     Increase activity slowly  As directed         Medication List    TAKE these medications       amiodarone 200 MG tablet  Commonly known as:   PACERONE  Take 200 mg by mouth 2 (two) times daily.     aspirin 81 MG EC tablet  Take 1 tablet (81 mg total) by mouth daily.     atorvastatin 20 MG tablet  Commonly known as:  LIPITOR  Take 20 mg by mouth daily.     carboxymethylcellulose 0.5 % Soln  Commonly known as:  REFRESH PLUS  Place 1 drop into both eyes 3 (three) times daily as needed (dry eyes).     levothyroxine 50 MCG tablet  Commonly known as:  SYNTHROID, LEVOTHROID  Take 50 mcg by mouth daily before breakfast.     magnesium oxide 400 (241.3 MG) MG tablet  Commonly known as:  MAG-OX  Take 1 tablet (400 mg total) by mouth 2 (two) times daily.     metoprolol tartrate 25 MG tablet  Commonly known as:  LOPRESSOR  Take 1 tablet (25 mg total) by mouth 2 (two) times daily.     multivitamin with minerals Tabs  Take 1 tablet by mouth daily.     pantoprazole 40 MG tablet  Commonly known  as:  PROTONIX  Take 1 tablet (40 mg total) by mouth daily at 6 (six) AM.     ramipril 5 MG capsule  Commonly known as:  ALTACE  Take 5 mg by mouth 2 (two) times daily.     Ticagrelor 90 MG Tabs tablet  Commonly known as:  BRILINTA  Take 1 tablet (90 mg total) by mouth 2 (two) times daily.     triamcinolone 55 MCG/ACT nasal inhaler  Commonly known as:  NASACORT  Place 2 sprays into the nose daily.           Follow-up Information   Follow up with Babak Lucus, PA-C. (Our office scheduler will call you with the appt date and time.)    Contact information:   [address] [place] [address] [phone]       Signed: Wilburt Finlay 03/16/2013, 10:45 AM

## 2013-03-18 IMAGING — CR DG CHEST 2V
2 series · 2 of 2 positions shown · non-contrast
Comparison: 09/05/2012.

CLINICAL DATA: Chest tube removal.

CHEST - 2 VIEW

[w chest pa]
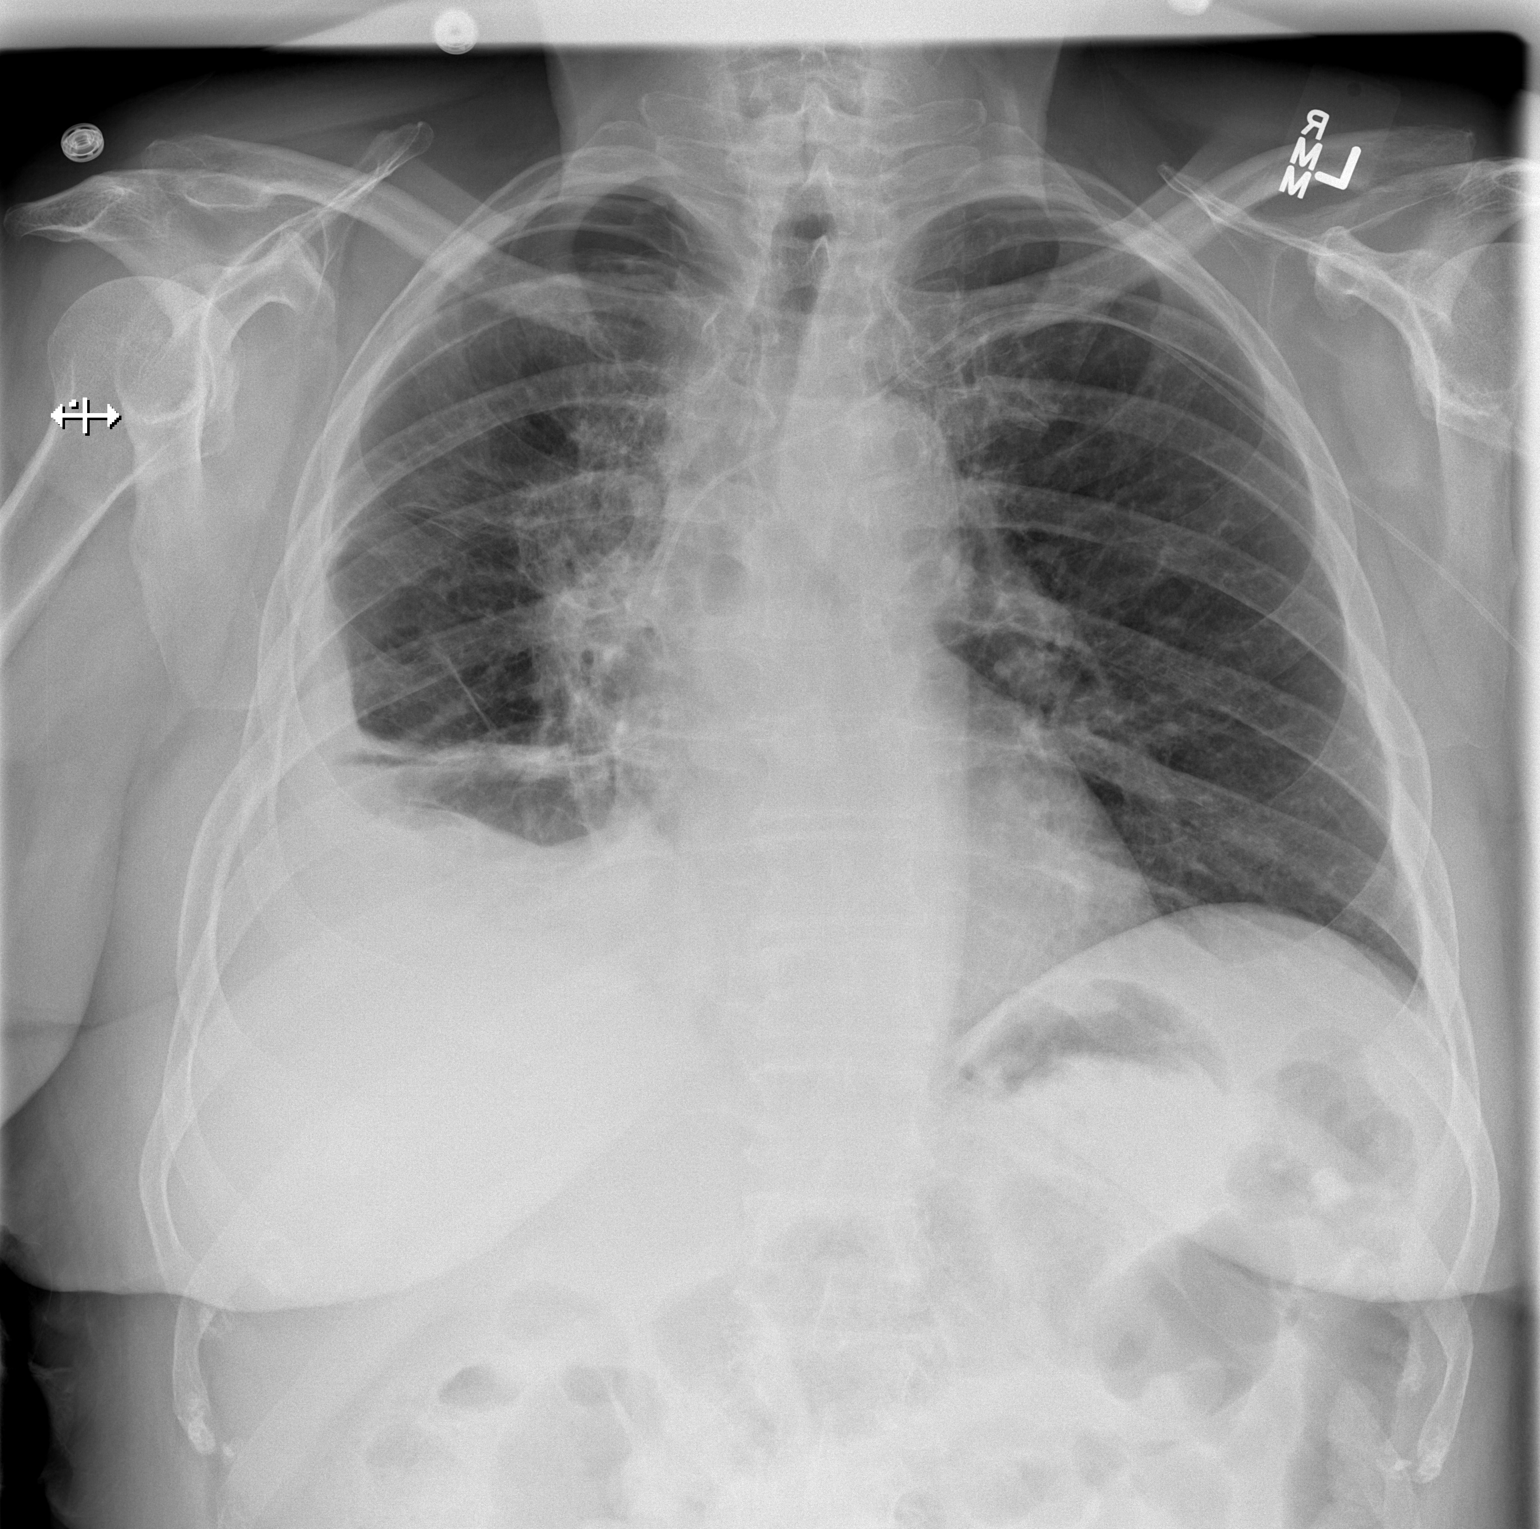

[w chest lat]
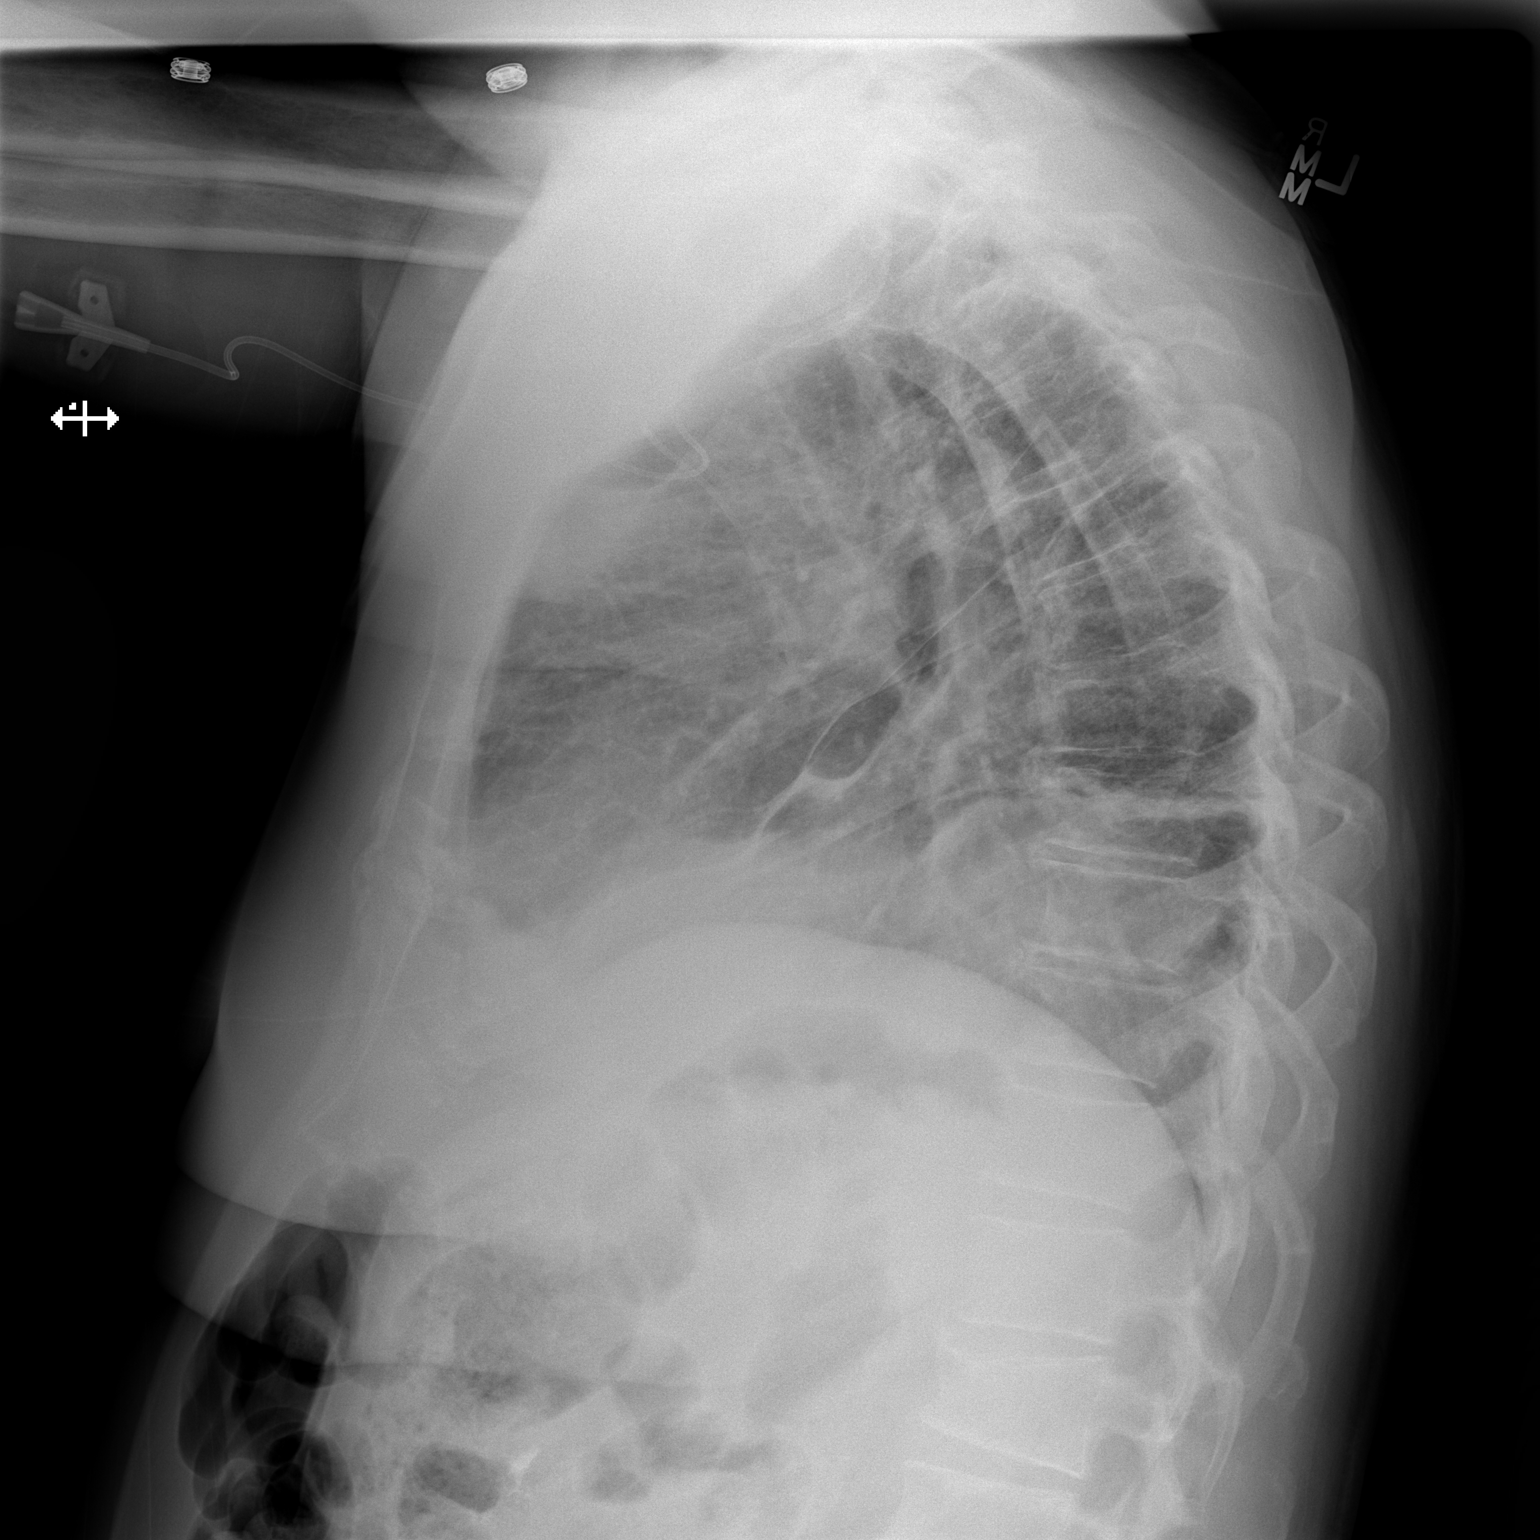

[2 of 2 positions shown; findings below may reference images not displayed]

FINDINGS: Trachea is midline.  Heart size stable.  Left PICC tip
projects over the SVC.  Right chest tube has been removed.  Small
right apical pneumothorax is stable.  Radiation fibrosis and volume
loss in the medial right hemithorax and small right pleural
effusion are stable.  Left lung is clear. L1 compression fracture
is stable.
IMPRESSION: 1.  Small right hydropneumothorax after removal of right chest
tube, stable.
2.  Radiation changes and volume loss in the medial right
hemithorax.

## 2013-03-19 IMAGING — CR DG CHEST 2V
2 series · 2 of 2 positions shown · non-contrast
Comparison: 09/06/2012

CLINICAL DATA: Post chest tube removal, chylothorax

CHEST - 2 VIEW

[w chest pa]
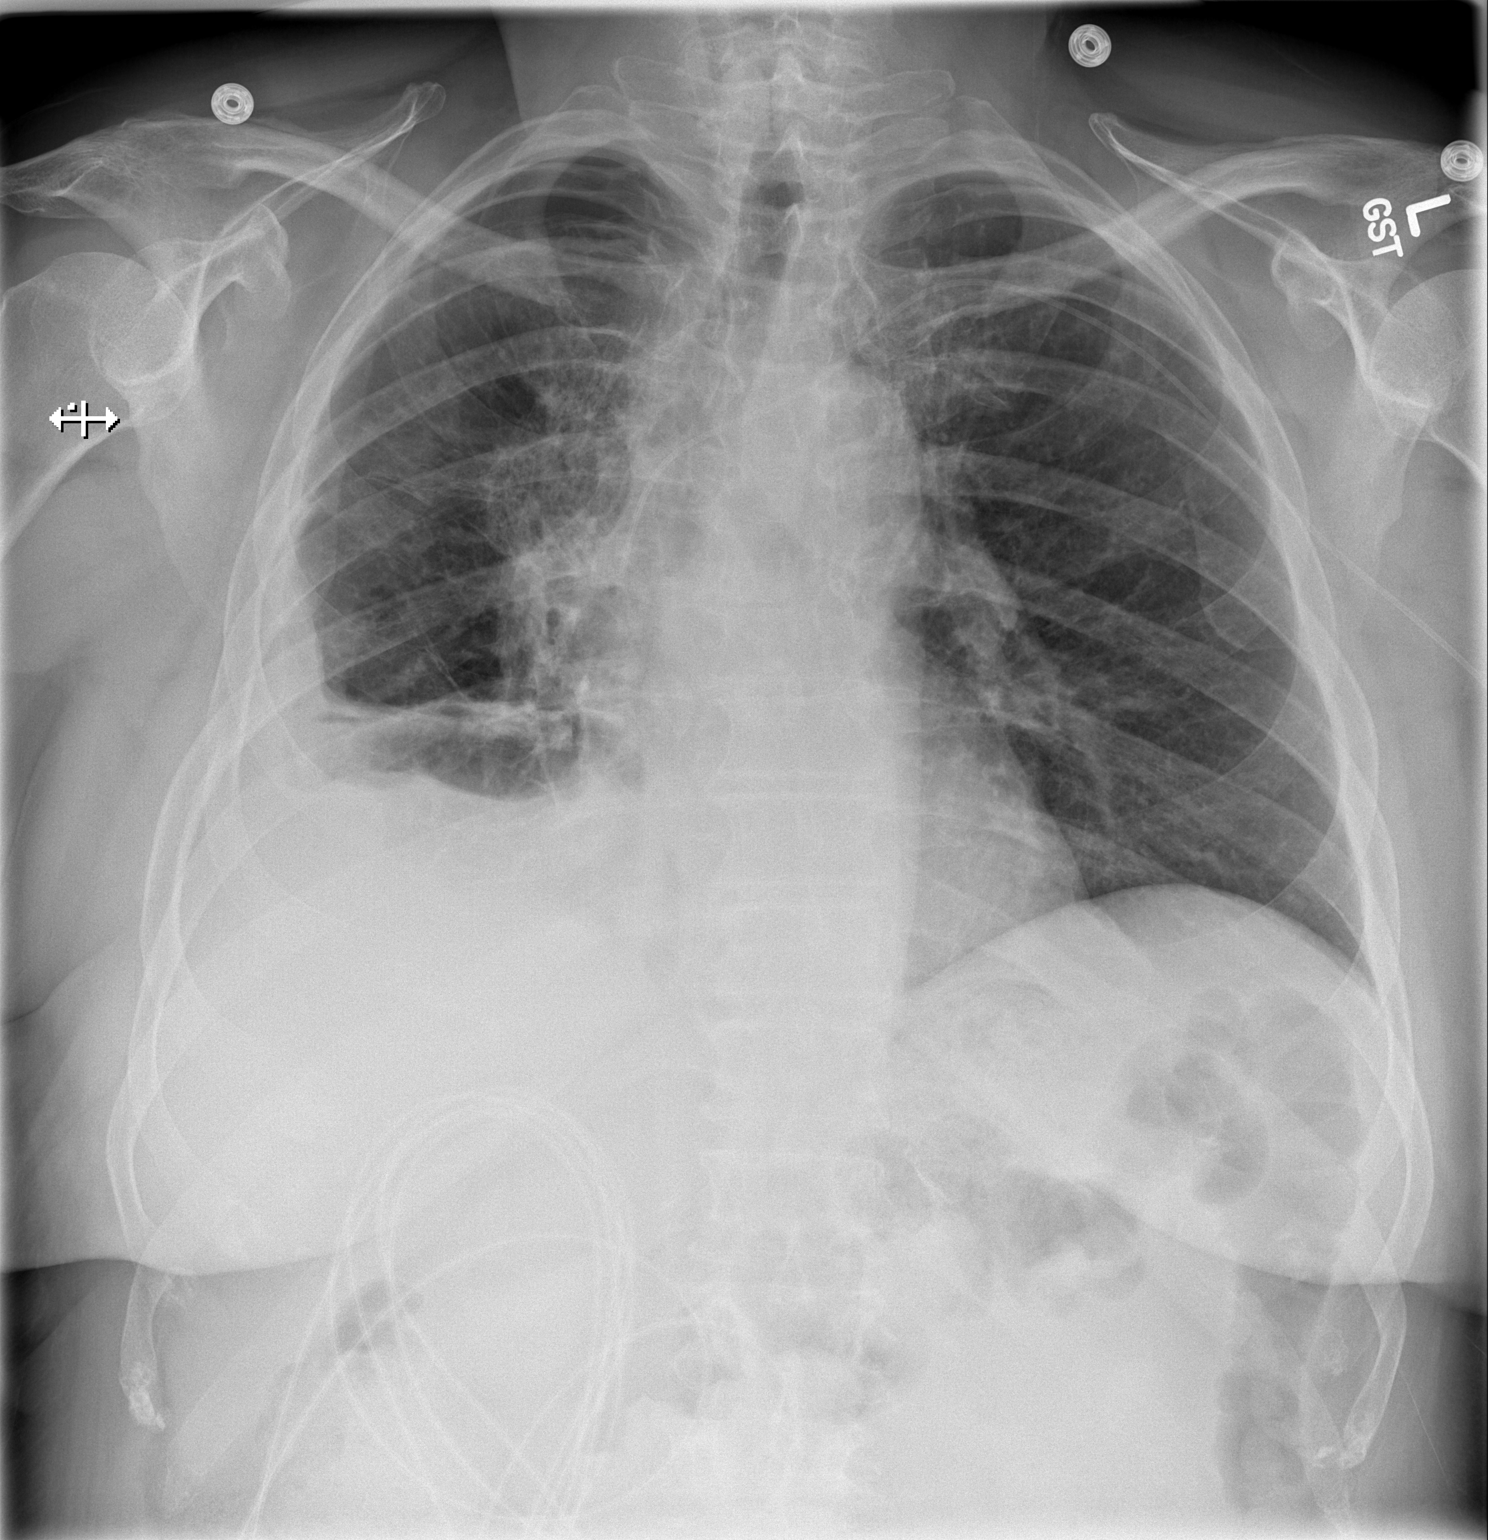

[w chest lat]
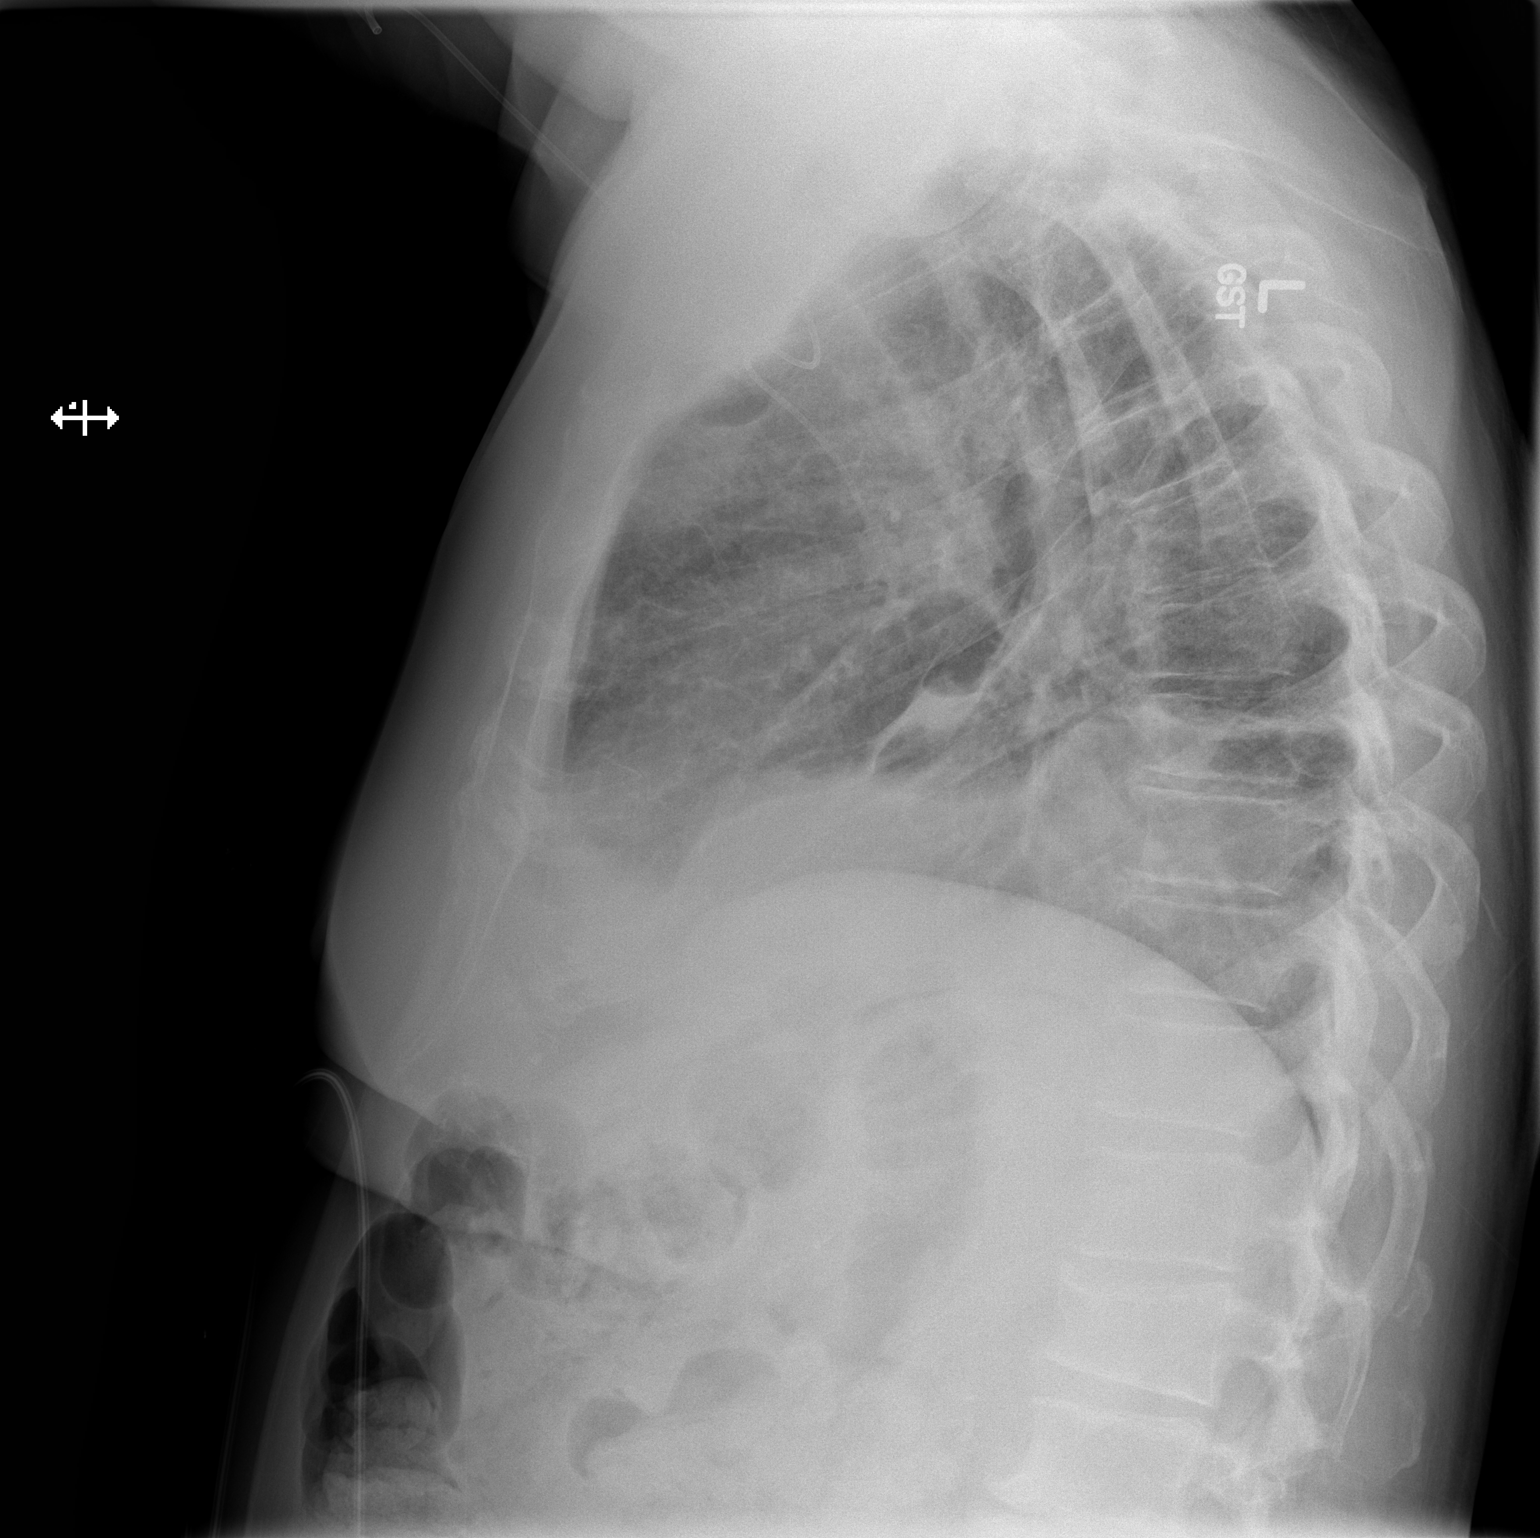

[2 of 2 positions shown; findings below may reference images not displayed]

FINDINGS: Moderate right pleural effusion with associated
consolidation, likely compressive atelectasis, similar to prior.
Right pneumothorax is similar to prior measuring up to 1.7 cm from
the superior chest wall to the lung apex.  The left lung remains
predominately clear. Cardiomediastinal contours are unchanged with
aortic atherosclerosis and tortuosity.  There is a left-sided PICC
line with tip projecting over the proximal SVC.  No interval
osseous change.
IMPRESSION: Right hydropneumothorax is similar to prior.

## 2013-03-21 ENCOUNTER — Encounter: Payer: Self-pay | Admitting: *Deleted

## 2013-03-22 ENCOUNTER — Encounter: Payer: Self-pay | Admitting: *Deleted

## 2013-03-22 ENCOUNTER — Encounter: Payer: Self-pay | Admitting: Cardiovascular Disease

## 2013-03-22 ENCOUNTER — Ambulatory Visit: Payer: Self-pay | Admitting: Oncology

## 2013-04-09 ENCOUNTER — Other Ambulatory Visit: Payer: Self-pay | Admitting: *Deleted

## 2013-04-09 ENCOUNTER — Telehealth: Payer: Self-pay | Admitting: *Deleted

## 2013-04-09 MED ORDER — LEVOTHYROXINE SODIUM 75 MCG PO TABS: 75.0000 ug | ORAL_TABLET | ORAL | Status: DC

## 2013-04-09 NOTE — Telephone Encounter (Signed)
S/w with patient informing him that the pharmacy is still sending refill request for of levorthyroxine. This was increased by Dr. [name] to  3/14.patient confirmed that he is taking the .dose.

## 2013-04-09 NOTE — Telephone Encounter (Signed)
S/W patient because the pharmacy sent a refill request for levothyroxine , however Dr. [name] increased this to . Patient confirmed that the is taking the . Recommended that he call the pharmacy to have them to take the off from automatic refill.

## 2013-04-10 ENCOUNTER — Other Ambulatory Visit: Payer: Self-pay | Admitting: *Deleted

## 2013-04-10 MED ORDER — LEVOTHYROXINE SODIUM 75 MCG PO TABS: 75.0000 ug | ORAL_TABLET | ORAL | Status: DC

## 2013-04-10 MED ORDER — LEVOTHYROXINE SODIUM 75 MCG PO TABS: 75.0000 ug | ORAL_TABLET | Freq: Every day | ORAL | Status: DC

## 2013-04-10 NOTE — Telephone Encounter (Signed)
rx sent to pharmacy by e-script  

## 2013-04-10 NOTE — Addendum Note (Signed)
Addended by: Sueanne Margarita on: 04/10/2013 04:26 PM   Modules accepted: Orders

## 2013-04-23 ENCOUNTER — Other Ambulatory Visit: Payer: Self-pay | Admitting: Cardiovascular Disease

## 2013-04-23 ENCOUNTER — Encounter: Payer: Self-pay | Admitting: Cardiovascular Disease

## 2013-04-23 ENCOUNTER — Ambulatory Visit (INDEPENDENT_AMBULATORY_CARE_PROVIDER_SITE_OTHER): Payer: Commercial Managed Care - PPO | Admitting: Cardiovascular Disease

## 2013-04-23 VITALS — BP 110/70 | HR 64 | Resp 16 | Ht 72.0 in | Wt 222.3 lb

## 2013-04-23 DIAGNOSIS — I472 Ventricular tachycardia: Secondary | ICD-10-CM

## 2013-04-23 DIAGNOSIS — Z95810 Presence of automatic (implantable) cardiac defibrillator: Secondary | ICD-10-CM

## 2013-04-23 DIAGNOSIS — I255 Ischemic cardiomyopathy: Secondary | ICD-10-CM

## 2013-04-23 DIAGNOSIS — I2589 Other forms of chronic ischemic heart disease: Secondary | ICD-10-CM

## 2013-04-23 DIAGNOSIS — I469 Cardiac arrest, cause unspecified: Secondary | ICD-10-CM

## 2013-04-23 DIAGNOSIS — I213 ST elevation (STEMI) myocardial infarction of unspecified site: Secondary | ICD-10-CM

## 2013-04-23 DIAGNOSIS — I219 Acute myocardial infarction, unspecified: Secondary | ICD-10-CM

## 2013-04-23 LAB — ICD DEVICE OBSERVATION

## 2013-04-23 NOTE — Patient Instructions (Addendum)
Please set up the transmitter for your ICD. After the transmission is sent please call to make sure that it was received by the office. If the transmission is not received with use of the filter, please call the number located in the brochure given at today's office visit to get the Wire X cellular option. Follow up in the office in 12 months.

## 2013-04-23 NOTE — Progress Notes (Signed)
Single chamber ICD office interrogation. Normal device function. Will follow up remotely.

## 2013-04-25 LAB — ICD DEVICE OBSERVATION
ATRIAL PACING ICD: 0 pct
BATTERY VOLTAGE: 3.17 V
BRDY-0002RV: 40 {beats}/min
DEV-0020ICD: NEGATIVE
FVT: 0
HV IMPEDENCE: 78 Ohm
MODE SWITCH EPISODES: 0
PACEART VT: 0
RV LEAD AMPLITUDE: 20 mv

## 2013-04-28 ENCOUNTER — Other Ambulatory Visit: Payer: Self-pay | Admitting: Cardiovascular Disease

## 2013-04-29 ENCOUNTER — Encounter: Payer: Self-pay | Admitting: Cardiovascular Disease

## 2013-04-30 ENCOUNTER — Telehealth: Payer: Self-pay | Admitting: *Deleted

## 2013-04-30 NOTE — Telephone Encounter (Signed)
LM informing patient that info on WireX would be mailed to him. Encouraged patient to call back if there are any questions.

## 2013-05-04 ENCOUNTER — Other Ambulatory Visit: Payer: Self-pay | Admitting: Cardiovascular Disease

## 2013-05-04 NOTE — Telephone Encounter (Signed)
Pt calling-been trying to get his medicine all week-Needs his Atorvastatin 20mg -Please Please call today-Call to [phone]!

## 2013-05-16 ENCOUNTER — Encounter: Payer: Self-pay | Admitting: Cardiovascular Disease

## 2013-05-16 DIAGNOSIS — Z95810 Presence of automatic (implantable) cardiac defibrillator: Secondary | ICD-10-CM | POA: Insufficient documentation

## 2013-05-16 NOTE — Assessment & Plan Note (Signed)
Currently NYHA functional class II, clinically euvolemic 

## 2013-05-16 NOTE — Progress Notes (Signed)
Patient ID: [name] [name], male   DOB: [birth year], 57 y.o.   MRN: [id number]      Reason for office visit Defibrillator implantation followed  [name] [name] has severe ischemic cardiomyopathy and received a single-chamber defibrillator roughly one month prior to today's visit and is here for device check. The site has healed very nicely and has no complaints related to the surgical procedure. Has not had any signs or symptoms of infection.  Device check today shows normal function of his single-chamber Medtronic defibrillator. There have been no episodes of detected arrhythmia, there is no ventricular pacing, is up to fall (still in the early phase appears to show steady thoracic impedance. The device is programmed to backup VVI 40.  He has not had any problems with edema shortness of breath at rest or with activity syncope or to verbal or discharges.    No Known Allergies  Current Outpatient Prescriptions  Medication Sig Dispense Refill  . amiodarone (PACERONE) 200 MG tablet Take 200 mg by mouth 2 (two) times daily.      Marland Kitchen aspirin EC 81 MG EC tablet Take 1 tablet (81 mg total) by mouth daily.      . carboxymethylcellulose (REFRESH PLUS) 0.5 % SOLN Place 1 drop into both eyes 3 (three) times daily as needed (dry eyes).      Marland Kitchen levothyroxine (SYNTHROID) 75 MCG tablet Take 1 tablet (75 mcg total) by mouth daily before breakfast.  30 tablet  11  . magnesium oxide (MAG-OX) 400 (241.3 MG) MG tablet Take 1 tablet (400 mg total) by mouth 2 (two) times daily.  60 tablet  3  . metoprolol tartrate (LOPRESSOR) 25 MG tablet Take 1 tablet (25 mg total) by mouth 2 (two) times daily.  60 tablet  11  . Multiple Vitamin (MULTIVITAMIN WITH MINERALS) TABS Take 1 tablet by mouth daily.      . pantoprazole (PROTONIX) 40 MG tablet Take 1 tablet (40 mg total) by mouth daily at 6 (six) AM.  30 tablet  11  . ramipril (ALTACE) 5 MG capsule Take 5 mg by mouth 2 (two) times daily.      . Ticagrelor (BRILINTA) 90 MG  TABS tablet Take 1 tablet (90 mg total) by mouth 2 (two) times daily.  60 tablet  11  . triamcinolone (NASACORT) 55 MCG/ACT nasal inhaler Place 2 sprays into the nose daily.      Marland Kitchen atorvastatin (LIPITOR) 20 MG tablet TAKE 1 TABLET EVERY DAY  30 tablet  6   No current facility-administered medications for this visit.    Past Medical History  Diagnosis Date  . Sustained ventricular tachycardia with associated syncope 08/13/2012  . GERD (gastroesophageal reflux disease) 08/13/2012  . Hyperglycemia 08/13/2012  . Cardiomyopathy, ischemic, by cardiac cath 30-35% 08/13/12  08/13/2012  . CAD (coronary artery disease) 3 vessel disease 08/13/2012  . 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 Abnormal TSH 09/06/2012  . Cardiac arrest, 08/14/12, requiring Defib X 2 for V tach 09/07/2012  . ICD (implantable cardiac defibrillator) in place   . S/P coronary artery stent placement, acutely to RCA for acute Post. MI with BMS, 08/13/12   . STEMI (ST elevation myocardial infarction) 08/13/2012  . Lung cancer 2010    "had chem & radiation" (03/15/2013)  . Pneumonia 07/2012  . Hypothyroidism   . Daily headache     "seems to be subsiding over the last month or so" (03/15/2013)  . Arthritis     "hands" (03/15/2013)  . History  of echocardiogram 01-17-2013    EF 30-35% diffuse hypokinesis,severe hypokinesis    Past Surgical History  Procedure Laterality Date  . No past surgeries    . Cardiac defibrillator placement  03/15/2013  . Coronary angioplasty with stent placement  07/2012    "1" (03/15/2013)    No family history on file.  History   Social History  . Marital Status: Married    Spouse Name: N/A    Number of Children: N/A  . Years of Education: N/A   Occupational History  . Not on file.   Social History Main Topics  . Smoking status: Current Every Day Smoker -- 0.10 packs/day for 45 years    Types: Cigarettes  . Smokeless tobacco: Former Neurosurgeon    Types: Snuff     Comment: 03/15/2013 "smoking mostly  e-cigarettes; quit snuff years ago""  . Alcohol Use: No  . Drug Use: No  . Sexually Active: Not Currently   Other Topics Concern  . Not on file   Social History Narrative  . No narrative on file    Review of systems: The patient specifically denies any chest pain at rest or with exertion, dyspnea at rest or with exertion, orthopnea, paroxysmal nocturnal dyspnea, syncope, palpitations, focal neurological deficits, intermittent claudication, lower extremity edema, unexplained weight gain, cough, hemoptysis or wheezing.  The patient also denies abdominal pain, nausea, vomiting, dysphagia, diarrhea, constipation, polyuria, polydipsia, dysuria, hematuria, frequency, urgency, abnormal bleeding or bruising, fever, chills, unexpected weight changes, mood swings, change in skin or hair texture, change in voice quality, auditory or visual problems, allergic reactions or rashes, new musculoskeletal complaints other than usual "aches and pains".   PHYSICAL EXAM BP 110/70  Pulse 64  Resp 16  Ht 6' (1.829 m)  Wt 222 lb 4.8 oz (100.835 kg)  BMI 30.14 kg/m2  General: Alert, oriented x3, no distress Head: no evidence of trauma, PERRL, EOMI, no exophtalmos or lid lag, no myxedema, no xanthelasma; normal ears, nose and oropharynx Neck: normal jugular venous pulsations and no hepatojugular reflux; brisk carotid pulses without delay and no carotid bruits Chest: clear to auscultation, no signs of consolidation by percussion or palpation, normal fremitus, symmetrical and full respiratory excursions; healthy left subclavian fibular side Cardiovascular: normal position and quality of the apical impulse, regular rhythm, normal first and second heart sounds, no murmurs, rubs or gallops Abdomen: no tenderness or distention, no masses by palpation, no abnormal pulsatility or arterial bruits, normal bowel sounds, no hepatosplenomegaly Extremities: no clubbing, cyanosis or edema; 2+ radial, ulnar and brachial  pulses bilaterally; 2+ right femoral, posterior tibial and dorsalis pedis pulses; 2+ left femoral, posterior tibial and dorsalis pedis pulses; no subclavian or femoral bruits Neurological: grossly nonfocal   Lipid Panel     Component Value Date/Time   CHOL 156 08/22/2012 0840   TRIG 70 09/04/2012 0500   HDL 35* 08/16/2012 0410   CHOLHDL 3.7 08/16/2012 0410   VLDL 21 08/16/2012 0410   LDLCALC 74 08/16/2012 0410    BMET    Component Value Date/Time   NA 130* 09/07/2012 0530   K 3.9 09/07/2012 0530   CL 91* 09/07/2012 0530   CO2 32 09/07/2012 0530   GLUCOSE 100* 09/07/2012 0530   BUN 11 09/07/2012 0530   CREATININE 0.71 09/07/2012 0530   CALCIUM 8.6 09/07/2012 0530   GFRNONAA >90 09/07/2012 0530   GFRAA >90 09/07/2012 0530     ASSESSMENT AND PLAN ICD (implantable cardioverter-defibrillator) in place Normal device function. Outputs are programmed  to chronic levels. We discussed his "shock plan". He should call the office for single shocks not associated with chest pain dyspnea or syncope, but should immediately call emergency medical services for shocks associated with the above symptoms or for more than 1 shock in a 24-hour period  Cardiomyopathy, ischemic, by cardiac cath 30-35% 08/13/12  Currently NYHA functional class II, clinically euvolemic.  STEMI (ST elevation myocardial infarction), with Stent to RCA, BMS 08/13/12 This was complicated by ventricular tachycardia requiring 2 defibrillation shocks. He has not had malignant ventricular arrhythmia outside of the immediate peri-infarction period.   to be enrolled in [facility] remotes device followup every 3 months, followup in office device check in one year [name] [name]  Thurmon Fair, MD, [facility] [phone] office [phone] pager

## 2013-05-16 NOTE — Assessment & Plan Note (Signed)
This was complicated by ventricular tachycardia requiring 2 defibrillation shocks. He has not had malignant ventricular arrhythmia outside of the immediate peri-infarction period.

## 2013-05-16 NOTE — Assessment & Plan Note (Signed)
Normal device function. Outputs are programmed to chronic levels. We discussed his "shock plan". He should call the office for single shocks not associated with chest pain dyspnea or syncope, but should immediately call emergency medical services for shocks associated with the above symptoms or for more than 1 shock in a 24-hour period

## 2013-05-22 ENCOUNTER — Ambulatory Visit: Payer: Self-pay | Admitting: Oncology

## 2013-05-22 LAB — CBC CANCER CENTER
Basophil %: 2.6 %
Eosinophil %: 4.2 %
HCT: 41.8 % (ref 40.0–52.0)
HGB: 14.3 g/dL (ref 13.0–18.0)
Lymphocyte #: 1.2 x10 3/mm (ref 1.0–3.6)
MCHC: 34.3 g/dL (ref 32.0–36.0)
MCV: 95 fL (ref 80–100)
Monocyte #: 0.8 x10 3/mm (ref 0.2–1.0)
Monocyte %: 10.8 %
Neutrophil %: 65.4 %
Platelet: 252 x10 3/mm (ref 150–440)
RBC: 4.42 10*6/uL (ref 4.40–5.90)
RDW: 14.6 % — ABNORMAL HIGH (ref 11.5–14.5)

## 2013-05-22 LAB — COMPREHENSIVE METABOLIC PANEL
Albumin: 3.6 g/dL (ref 3.4–5.0)
Alkaline Phosphatase: 86 U/L (ref 50–136)
Anion Gap: 5 — ABNORMAL LOW (ref 7–16)
BUN: 15 mg/dL (ref 7–18)
Chloride: 101 mmol/L (ref 98–107)
Co2: 31 mmol/L (ref 21–32)
Creatinine: 1.14 mg/dL (ref 0.60–1.30)
EGFR (African American): >60
Osmolality: 276 (ref 275–301)
SGOT(AST): 34 U/L (ref 15–37)
SGPT (ALT): 46 U/L (ref 12–78)
Sodium: 137 mmol/L (ref 136–145)
Total Protein: 7.1 g/dL (ref 6.4–8.2)

## 2013-06-22 ENCOUNTER — Ambulatory Visit: Payer: Self-pay | Admitting: Oncology

## 2013-06-25 DIAGNOSIS — I428 Other cardiomyopathies: Secondary | ICD-10-CM

## 2013-06-26 ENCOUNTER — Other Ambulatory Visit: Payer: Self-pay | Admitting: Cardiovascular Disease

## 2013-06-26 NOTE — Telephone Encounter (Signed)
Rx was sent to pharmacy electronically. 

## 2013-07-02 ENCOUNTER — Other Ambulatory Visit: Payer: Self-pay | Admitting: Cardiovascular Disease

## 2013-07-03 ENCOUNTER — Encounter: Payer: Self-pay | Admitting: *Deleted

## 2013-07-03 LAB — REMOTE ICD DEVICE
BRDY-0002RV: 40 {beats}/min
FVT: 0
HV IMPEDENCE: 74 Ohm
VF: 0

## 2013-07-23 ENCOUNTER — Encounter: Payer: Self-pay | Admitting: Cardiovascular Disease

## 2013-07-23 ENCOUNTER — Other Ambulatory Visit: Payer: Self-pay | Admitting: Cardiology

## 2013-07-24 NOTE — Telephone Encounter (Signed)
Rx was sent to pharmacy electronically. 

## 2013-07-25 NOTE — Telephone Encounter (Signed)
Message forwarded to Dr. Royann Shivers for further instructions.  Stents placed 9.22.13.

## 2013-07-26 NOTE — Telephone Encounter (Signed)
I think he can stop the Brilinta, but question is best addressed to Dr. [name] who treated him for the STEMI. Tom? Thurmon Fair, MD   Message forwarded to Dr. [name] to review and advise per Dr. Royann Shivers.

## 2013-07-28 ENCOUNTER — Other Ambulatory Visit: Payer: Self-pay | Admitting: Cardiology

## 2013-07-31 NOTE — Telephone Encounter (Signed)
Rx was sent to pharmacy electronically. 

## 2013-08-03 ENCOUNTER — Encounter: Payer: Self-pay | Admitting: Cardiovascular Disease

## 2013-08-03 ENCOUNTER — Ambulatory Visit: Payer: Commercial Managed Care - PPO

## 2013-08-03 DIAGNOSIS — I428 Other cardiomyopathies: Secondary | ICD-10-CM

## 2013-08-03 DIAGNOSIS — I509 Heart failure, unspecified: Secondary | ICD-10-CM

## 2013-08-03 LAB — ICD DEVICE OBSERVATION

## 2013-08-03 NOTE — Telephone Encounter (Signed)
Informed patient that transmission was received.

## 2013-08-03 NOTE — Telephone Encounter (Signed)
Message forwarded to S. Saunders, CMA r/t device/monitor concerns. 

## 2013-08-08 ENCOUNTER — Telehealth: Payer: Self-pay | Admitting: Pharmacist Clinician (PhC)/ Clinical Pharmacy Specialist

## 2013-08-08 DIAGNOSIS — I25810 Atherosclerosis of coronary artery bypass graft(s) without angina pectoris: Secondary | ICD-10-CM

## 2013-08-08 MED ORDER — CLOPIDOGREL BISULFATE 75 MG PO TABS: 75.0000 mg | ORAL_TABLET | Freq: Every day | ORAL | Status: DC

## 2013-08-08 NOTE — Telephone Encounter (Signed)
Pt had called to Dr. [name], has been on Brilinta x 1 year, wanted to know if okay to stop.  Dr. [name] would like pt to continue, but willing to try Plavix and have p2y12 test.  Explained to pt to start med after out of Brilinta then wait 2 wks before going to [facility] for blood work.  Pt voiced understanding

## 2013-08-14 ENCOUNTER — Encounter: Payer: Self-pay | Admitting: *Deleted

## 2013-08-14 LAB — REMOTE ICD DEVICE
BRDY-0002RV: 40 {beats}/min
DEV-0020ICD: NEGATIVE
PACEART VT: 0
RV LEAD AMPLITUDE: 17.4 mv
RV LEAD IMPEDENCE ICD: 494 Ohm
TZON-0003VSLOWVT: 370.3 ms
VENTRICULAR PACING ICD: 0 pct
VF: 0

## 2013-08-15 ENCOUNTER — Telehealth: Payer: Self-pay | Admitting: Cardiovascular Disease

## 2013-08-15 NOTE — Telephone Encounter (Signed)
Returned call.  Pt informed he is supposed to go to [facility] lab for this test as they are the only lab that does this test.  Pt verbalized understanding and agreed w/ plan.  Pt given phone number for Information to [facility] and informed directions to hospital will be mailed.  Pt verbalized understanding and agreed w/ plan.  Directions printed and mailed.

## 2013-08-15 NOTE — Telephone Encounter (Signed)
Wants to know where he is suppose to have lab work? Is it here or at [facility]?

## 2013-08-25 ENCOUNTER — Other Ambulatory Visit (HOSPITAL_COMMUNITY): Payer: Self-pay | Admitting: Cardiology

## 2013-08-26 ENCOUNTER — Other Ambulatory Visit: Payer: Self-pay | Admitting: Cardiovascular Disease

## 2013-08-27 ENCOUNTER — Ambulatory Visit: Payer: Self-pay | Admitting: Oncology

## 2013-08-27 ENCOUNTER — Other Ambulatory Visit (HOSPITAL_COMMUNITY): Payer: Self-pay | Admitting: Cardiology

## 2013-08-27 NOTE — Telephone Encounter (Signed)
Rx was sent to pharmacy electronically. 

## 2013-08-28 LAB — COMPREHENSIVE METABOLIC PANEL
Albumin: 3.8 g/dL (ref 3.4–5.0)
BUN: 13 mg/dL (ref 7–18)
Bilirubin,Total: 0.5 mg/dL (ref 0.2–1.0)
Chloride: 100 mmol/L (ref 98–107)
Co2: 33 mmol/L — ABNORMAL HIGH (ref 21–32)
Creatinine: 1.07 mg/dL (ref 0.60–1.30)
EGFR (African American): >60
EGFR (Non-African Amer.): >60
Potassium: 4.9 mmol/L (ref 3.5–5.1)
SGPT (ALT): 70 U/L (ref 12–78)
Sodium: 138 mmol/L (ref 136–145)
Total Protein: 7.2 g/dL (ref 6.4–8.2)

## 2013-08-28 LAB — CBC CANCER CENTER
Basophil #: 0 x10 3/mm (ref 0.0–0.1)
Eosinophil #: 0.4 x10 3/mm (ref 0.0–0.7)
Eosinophil %: 4 %
HGB: 14.7 g/dL (ref 13.0–18.0)
Lymphocyte #: 1.5 x10 3/mm (ref 1.0–3.6)
Lymphocyte %: 16.7 %
MCH: 32.4 pg (ref 26.0–34.0)
MCV: 95 fL (ref 80–100)
Monocyte %: 9.9 %
RBC: 4.54 10*6/uL (ref 4.40–5.90)

## 2013-09-09 LAB — ICD DEVICE OBSERVATION

## 2013-09-10 ENCOUNTER — Ambulatory Visit (INDEPENDENT_AMBULATORY_CARE_PROVIDER_SITE_OTHER): Payer: Commercial Managed Care - PPO

## 2013-09-10 DIAGNOSIS — I509 Heart failure, unspecified: Secondary | ICD-10-CM

## 2013-09-10 DIAGNOSIS — I255 Ischemic cardiomyopathy: Secondary | ICD-10-CM

## 2013-09-10 DIAGNOSIS — I2589 Other forms of chronic ischemic heart disease: Secondary | ICD-10-CM

## 2013-09-10 DIAGNOSIS — I469 Cardiac arrest, cause unspecified: Secondary | ICD-10-CM

## 2013-09-10 DIAGNOSIS — I472 Ventricular tachycardia: Secondary | ICD-10-CM

## 2013-09-21 ENCOUNTER — Encounter: Payer: Self-pay | Admitting: *Deleted

## 2013-09-21 LAB — REMOTE ICD DEVICE
FVT: 0
HV IMPEDENCE: 69 Ohm
PACEART VT: 0
RV LEAD IMPEDENCE ICD: 494 Ohm
RV LEAD THRESHOLD: 0.5 V
TZON-0003VSLOWVT: 370.3 ms
VF: 0

## 2013-09-22 ENCOUNTER — Other Ambulatory Visit (HOSPITAL_COMMUNITY): Payer: Self-pay | Admitting: Cardiology

## 2013-09-22 ENCOUNTER — Ambulatory Visit: Payer: Self-pay | Admitting: Oncology

## 2013-09-24 NOTE — Telephone Encounter (Signed)
Rx was sent to pharmacy electronically. 

## 2013-09-26 LAB — PLATELET INHIBITION P2Y12: Platelet Function  P2Y12: 220 [PRU] (ref 194–418)

## 2013-09-27 ENCOUNTER — Other Ambulatory Visit: Payer: Self-pay

## 2013-10-02 ENCOUNTER — Ambulatory Visit (INDEPENDENT_AMBULATORY_CARE_PROVIDER_SITE_OTHER): Payer: Commercial Managed Care - PPO | Admitting: Cardiovascular Disease

## 2013-10-02 ENCOUNTER — Encounter: Payer: Self-pay | Admitting: Cardiovascular Disease

## 2013-10-02 VITALS — BP 130/70 | HR 57 | Ht 72.0 in | Wt 235.5 lb

## 2013-10-02 DIAGNOSIS — I2510 Atherosclerotic heart disease of native coronary artery without angina pectoris: Secondary | ICD-10-CM

## 2013-10-02 DIAGNOSIS — R5381 Other malaise: Secondary | ICD-10-CM

## 2013-10-02 DIAGNOSIS — I2589 Other forms of chronic ischemic heart disease: Secondary | ICD-10-CM

## 2013-10-02 DIAGNOSIS — I255 Ischemic cardiomyopathy: Secondary | ICD-10-CM

## 2013-10-02 DIAGNOSIS — Z79899 Other long term (current) drug therapy: Secondary | ICD-10-CM

## 2013-10-02 DIAGNOSIS — Z95810 Presence of automatic (implantable) cardiac defibrillator: Secondary | ICD-10-CM

## 2013-10-02 DIAGNOSIS — K219 Gastro-esophageal reflux disease without esophagitis: Secondary | ICD-10-CM

## 2013-10-02 DIAGNOSIS — I219 Acute myocardial infarction, unspecified: Secondary | ICD-10-CM

## 2013-10-02 DIAGNOSIS — I213 ST elevation (STEMI) myocardial infarction of unspecified site: Secondary | ICD-10-CM

## 2013-10-02 LAB — COMPREHENSIVE METABOLIC PANEL
ALT: 46 U/L (ref 0–53)
AST: 49 U/L — ABNORMAL HIGH (ref 0–37)
Alkaline Phosphatase: 67 U/L (ref 39–117)
Creat: 0.9 mg/dL (ref 0.50–1.35)
Sodium: 139 mEq/L (ref 135–145)
Total Bilirubin: 0.6 mg/dL (ref 0.3–1.2)
Total Protein: 7.2 g/dL (ref 6.0–8.3)

## 2013-10-02 LAB — CBC
MCH: 31.8 pg (ref 26.0–34.0)
MCHC: 34.6 g/dL (ref 30.0–36.0)
MCV: 92 fL (ref 78.0–100.0)
Platelets: 289 10*3/uL (ref 150–400)

## 2013-10-02 NOTE — Patient Instructions (Signed)
Your physician recommends that you schedule a follow-up appointment in: 6 Months  Your physician recommends that you return for lab work in: CMP,CBC, TSH

## 2013-10-02 NOTE — Progress Notes (Signed)
Patient ID: [name] [name], male   DOB: [birth year], 57 y.o.   MRN: [id number]     HPI: [name] [name] is a 57 y.o. male who presents to the office for six-month cardiology evaluation.  [name] [name] presented to [facility] in September 2013 in the setting of sustained monomorphic ventricular tachycardia but also appeared to have episodes of polymorphic ventricular tachycardia. 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  A vision stent was inserted 2.75x28 and postdilated to 2.8. Also, ejection fraction improved to approximately 30-35% with grade 2 diastolic dysfunction. He underwent ICD implantation earlier this year by [name] [name] with a single chamber Medtronic Avera device. Prior to ICD implantation he did wear life as for approximately 3 months. He has been enrolled in the telephonic recordings. Reportedly there has not been any defibrillator discharge. He also is a history of lung cancer status post radiation and chemotherapy by [name] [name]. When he was hospitalized last year he had recurrent pleural effusions with chylothorax and underwent talc pleurodesis by [name] [name]. Earlier this year, thyroid was elevated due to amiodarone increasing dose of thyroid replacement.  Presently, he denies chest pain. He remains fairly active. He is unaware of tachycardia palpitations.  Past Medical History  Diagnosis Date  . Sustained ventricular tachycardia with associated syncope 08/13/2012  . GERD (gastroesophageal reflux disease) 08/13/2012  . Hyperglycemia 08/13/2012  . Cardiomyopathy, ischemic, by cardiac cath 30-35% 08/13/12  08/13/2012  . CAD (coronary artery disease) 3 vessel disease 08/13/2012  . 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 Abnormal TSH 09/06/2012  . Cardiac arrest, 08/14/12, requiring Defib X 2 for V tach 09/07/2012  . ICD (implantable cardiac defibrillator) in place   . S/P coronary artery  stent placement, acutely to RCA for acute Post. MI with BMS, 08/13/12   . STEMI (ST elevation myocardial infarction) 08/13/2012  . Lung cancer 2010    "had chem & radiation" (03/15/2013)  . Pneumonia 07/2012  . Hypothyroidism   . Daily headache     "seems to be subsiding over the last month or so" (03/15/2013)  . Arthritis     "hands" (03/15/2013)  . History of echocardiogram 01-17-2013    EF 30-35% diffuse hypokinesis,severe hypokinesis    Past Surgical History  Procedure Laterality Date  . No past surgeries    . Cardiac defibrillator placement  03/15/2013  . Coronary angioplasty with stent placement  07/2012    "1" (03/15/2013)    No Known Allergies  Current Outpatient Prescriptions  Medication Sig Dispense Refill  . amiodarone (PACERONE) 200 MG tablet TAKE 1 TABLET TWICE A DAY  60 tablet  5  . aspirin EC 81 MG EC tablet Take 1 tablet (81 mg total) by mouth daily.      Marland Kitchen atorvastatin (LIPITOR) 20 MG tablet TAKE 1 TABLET EVERY DAY  30 tablet  6  . carboxymethylcellulose (REFRESH PLUS) 0.5 % SOLN Place 1 drop into both eyes 3 (three) times daily as needed (dry eyes).      . clopidogrel (PLAVIX) 75 MG tablet Take 1 tablet (75 mg total) by mouth daily.  30 tablet  1  . levothyroxine (SYNTHROID) 75 MCG tablet Take 1 tablet (75 mcg total) by mouth daily before breakfast.  30 tablet  11  . magnesium oxide (MAG-OX) 400 (241.3 MG) MG tablet TAKE 1 TABLET TWICE A DAY  60 tablet  8  . metoprolol tartrate (LOPRESSOR)  25 MG tablet TAKE 1 TABLET TWICE A DAY  60 tablet  8  . Multiple Vitamin (MULTIVITAMIN WITH MINERALS) TABS Take 1 tablet by mouth daily.      . pantoprazole (PROTONIX) 40 MG tablet TAKE 1 TABLET EVERY DAY AT 6AM  30 tablet  6  . ramipril (ALTACE) 5 MG capsule TAKE ONE CAPSULE TWICE A DAY  60 capsule  10  . triamcinolone (NASACORT) 55 MCG/ACT nasal inhaler Place 2 sprays into the nose daily.       No current facility-administered medications for this visit.    History   Social  History  . Marital Status: Married    Spouse Name: N/A    Number of Children: N/A  . Years of Education: N/A   Occupational History  . Not on file.   Social History Main Topics  . Smoking status: Current Every Day Smoker -- 0.10 packs/day for 45 years    Types: Cigarettes    Start date: 10/02/1973  . Smokeless tobacco: Former Neurosurgeon    Types: Snuff     Comment: 03/15/2013 "smoking mostly e-cigarettes; quit snuff years ago""  . Alcohol Use: No  . Drug Use: No  . Sexual Activity: Not Currently   Other Topics Concern  . Not on file   Social History Narrative  . No narrative on file    Additional social history is notable that he is married with one child. There is  tobacco use. No alcohol.  History reviewed. No pertinent family history.  ROS is negative for fevers, chills or night sweats.  He denies visual symptoms. He does note some easy bruisability. He denies uncontrollable bleeding. His wheezing. He denies cough or sputum production. He denies presyncope or syncope. He denies recurrent chest pain. He denies change in abdominal status. There is no bleeding in his stool or urine. He denies myalgias. He denies significant edema. His paresthesias the Other comprehensive 12 point system review is negative.  PE BP 130/70  Pulse 57  Ht 6' (1.829 m)  Wt 235 lb 8 oz (106.822 kg)  BMI 31.93 kg/m2  General: Alert, oriented, no distress.  Skin: normal turgor, no rashes HEENT: Normocephalic, atraumatic. Pupils round and reactive; sclera anicteric;no lid lag.  Nose without nasal septal hypertrophy Mouth/Parynx benign; Mallinpatti scale 3 Neck: No JVD, no carotid briuts Lungs: clear to ausculatation and percussion; no wheezing or rales Heart: RRR, s1 s2 normal 1/6 systolic murmur Abdomen: soft, nontender; no hepatosplenomehaly, BS+; abdominal aorta nontender and not dilated by palpation. Pulses 2+ Extremities: no clubbing cyanosis or edema, Homan's sign negative  Neurologic: grossly  nonfocal Psychologic: normal affect and mood.  ECG: Sinus rhythm at 57 beats per minute. QTc interval 486 ms  LABS:  BMET    Component Value Date/Time   NA 130* 09/07/2012 0530   K 3.9 09/07/2012 0530   CL 91* 09/07/2012 0530   CO2 32 09/07/2012 0530   GLUCOSE 100* 09/07/2012 0530   BUN 11 09/07/2012 0530   CREATININE 0.71 09/07/2012 0530   CALCIUM 8.6 09/07/2012 0530   GFRNONAA >90 09/07/2012 0530   GFRAA >90 09/07/2012 0530     Hepatic Function Panel     Component Value Date/Time   PROT 6.3 09/07/2012 0530   ALBUMIN 2.3* 09/07/2012 0530   AST 24 09/07/2012 0530   ALT 25 09/07/2012 0530   ALKPHOS 83 09/07/2012 0530   BILITOT 0.4 09/07/2012 0530     CBC    Component Value Date/Time   WBC  7.6 09/07/2012 0530   RBC 4.15* 09/07/2012 0530   HGB 12.6* 09/07/2012 0530   HCT 36.7* 09/07/2012 0530   PLT 300 09/07/2012 0530   MCV 88.4 09/07/2012 0530   MCH 30.4 09/07/2012 0530   MCHC 34.3 09/07/2012 0530   RDW 13.4 09/07/2012 0530   LYMPHSABS 1.0 09/07/2012 0530   MONOABS 0.9 09/07/2012 0530   EOSABS 0.8* 09/07/2012 0530   BASOSABS 0.1 09/07/2012 0530     BNP    Component Value Date/Time   PROBNP 2216.0* 08/21/2012 0930    Lipid Panel     Component Value Date/Time   CHOL 156 08/22/2012 0840   TRIG 70 09/04/2012 0500   HDL 35* 08/16/2012 0410   CHOLHDL 3.7 08/16/2012 0410   VLDL 21 08/16/2012 0410   LDLCALC 74 08/16/2012 0410     RADIOLOGY: No results found.    ASSESSMENT AND PLAN: [name] [name] is now 14 months status post his hospitalization for recurrent ventricular tachycardia both monomorphic and polymorphic associated with cardiac arrest. He is now status post ICD implantation. He had an upper length of one year and recently was switched to Plavix. Recent P2Y12 showing a level of 220. He does have a bare-metal stent and not a DES stent. I elected to keep him on long-term dual antiplatelet therapy. I will check a CBC chemistry profile and TSH today.  Adjustments were made as necessary to his medical regimen. I will see him in 6 months for cardiology reassessment and prior to that office visit a complete set of laboratory will be obtained.     Lennette Bihari, MD, [facility]  10/02/2013 10:08 AM

## 2013-10-03 ENCOUNTER — Ambulatory Visit: Payer: Commercial Managed Care - PPO | Admitting: Cardiovascular Disease

## 2013-10-09 ENCOUNTER — Encounter: Payer: Self-pay | Admitting: Cardiovascular Disease

## 2013-10-10 ENCOUNTER — Telehealth: Payer: Self-pay | Admitting: Cardiovascular Disease

## 2013-10-10 NOTE — Telephone Encounter (Signed)
Please call-His insurance denied coverage for his life vest.Does not know what he needs to do.He does not know if a letter was written stating that it was necessary for him to wear it.

## 2013-10-10 NOTE — Telephone Encounter (Signed)
Forwarded to B. Lassiter, CMA.  

## 2013-10-11 ENCOUNTER — Ambulatory Visit (INDEPENDENT_AMBULATORY_CARE_PROVIDER_SITE_OTHER): Payer: Commercial Managed Care - PPO | Admitting: *Deleted

## 2013-10-11 DIAGNOSIS — I255 Ischemic cardiomyopathy: Secondary | ICD-10-CM

## 2013-10-11 DIAGNOSIS — I509 Heart failure, unspecified: Secondary | ICD-10-CM

## 2013-10-11 DIAGNOSIS — I472 Ventricular tachycardia: Secondary | ICD-10-CM

## 2013-10-11 DIAGNOSIS — I469 Cardiac arrest, cause unspecified: Secondary | ICD-10-CM

## 2013-10-11 DIAGNOSIS — I2589 Other forms of chronic ischemic heart disease: Secondary | ICD-10-CM

## 2013-10-11 LAB — ICD DEVICE OBSERVATION

## 2013-10-11 LAB — MDC_IDC_ENUM_SESS_TYPE_REMOTE
Brady Statistic RV Percent Paced: 0 %
HighPow Impedance: 71 Ohm
Lead Channel Impedance Value: 494 Ohm
Lead Channel Pacing Threshold Amplitude: 0.625 V
Lead Channel Pacing Threshold Pulse Width: 0.4 ms
Lead Channel Setting Pacing Amplitude: 2 V
Lead Channel Setting Sensing Sensitivity: 0.3 mV
Zone Setting Detection Interval: 370.3 ms

## 2013-10-11 NOTE — Telephone Encounter (Signed)
Per [employer] a second appeal has been sent to [employer].  Patient notified and voiced understanding.

## 2013-10-15 ENCOUNTER — Telehealth: Payer: Self-pay | Admitting: *Deleted

## 2013-10-15 NOTE — Telephone Encounter (Signed)
Message forwarded to S. Saunders, CMA r/t device/monitor concerns. 

## 2013-10-15 NOTE — Telephone Encounter (Signed)
Pt called stating that he had a missed transmission on Friday, but he said that he did do it and on his end it said it was done. He wanted to know if we did receive the transmission or not.

## 2013-10-16 ENCOUNTER — Encounter: Payer: Self-pay | Admitting: *Deleted

## 2013-10-20 ENCOUNTER — Other Ambulatory Visit: Payer: Self-pay | Admitting: Cardiovascular Disease

## 2013-10-22 NOTE — Telephone Encounter (Signed)
Rx was sent to pharmacy electronically. 

## 2013-10-23 ENCOUNTER — Other Ambulatory Visit: Payer: Self-pay | Admitting: Cardiovascular Disease

## 2013-10-26 NOTE — Telephone Encounter (Signed)
Patient never returned the calls made to him. [facility] letter mailed to patient informing him that the transmission was received.

## 2013-10-29 ENCOUNTER — Ambulatory Visit: Payer: Self-pay | Admitting: Oncology

## 2013-10-29 LAB — CBC CANCER CENTER
Basophil #: 0.1 x10 3/mm (ref 0.0–0.1)
Basophil %: 1.4 %
Eosinophil %: 2.8 %
Lymphocyte %: 11.4 %
MCH: 32 pg (ref 26.0–34.0)
MCHC: 33.1 g/dL (ref 32.0–36.0)
Monocyte #: 0.7 x10 3/mm (ref 0.2–1.0)
Monocyte %: 8.1 %
Neutrophil #: 6.9 x10 3/mm — ABNORMAL HIGH (ref 1.4–6.5)
Platelet: 227 x10 3/mm (ref 150–440)
RBC: 4.25 10*6/uL — ABNORMAL LOW (ref 4.40–5.90)
RDW: 14.6 % — ABNORMAL HIGH (ref 11.5–14.5)

## 2013-10-29 LAB — COMPREHENSIVE METABOLIC PANEL
Albumin: 3.5 g/dL (ref 3.4–5.0)
Anion Gap: 5 — ABNORMAL LOW (ref 7–16)
BUN: 16 mg/dL (ref 7–18)
Calcium, Total: 8.8 mg/dL (ref 8.5–10.1)
Chloride: 101 mmol/L (ref 98–107)
Co2: 31 mmol/L (ref 21–32)
Creatinine: 1.03 mg/dL (ref 0.60–1.30)
EGFR (African American): >60
EGFR (Non-African Amer.): >60
Glucose: 113 mg/dL — ABNORMAL HIGH (ref 65–99)
SGOT(AST): 58 U/L — ABNORMAL HIGH (ref 15–37)
SGPT (ALT): 76 U/L (ref 12–78)
Total Protein: 6.8 g/dL (ref 6.4–8.2)

## 2013-11-13 ENCOUNTER — Ambulatory Visit (INDEPENDENT_AMBULATORY_CARE_PROVIDER_SITE_OTHER): Payer: Commercial Managed Care - PPO | Admitting: *Deleted

## 2013-11-13 DIAGNOSIS — I255 Ischemic cardiomyopathy: Secondary | ICD-10-CM

## 2013-11-13 DIAGNOSIS — I469 Cardiac arrest, cause unspecified: Secondary | ICD-10-CM

## 2013-11-13 DIAGNOSIS — I472 Ventricular tachycardia: Secondary | ICD-10-CM

## 2013-11-13 DIAGNOSIS — I2589 Other forms of chronic ischemic heart disease: Secondary | ICD-10-CM

## 2013-11-13 LAB — ICD DEVICE OBSERVATION

## 2013-11-22 ENCOUNTER — Ambulatory Visit: Payer: Self-pay | Admitting: Oncology

## 2013-11-25 ENCOUNTER — Other Ambulatory Visit: Payer: Self-pay | Admitting: Cardiovascular Disease

## 2013-11-25 ENCOUNTER — Other Ambulatory Visit (HOSPITAL_COMMUNITY): Payer: Self-pay | Admitting: Cardiology

## 2013-11-27 ENCOUNTER — Encounter: Payer: Self-pay | Admitting: *Deleted

## 2013-11-27 LAB — MDC_IDC_ENUM_SESS_TYPE_REMOTE
Battery Remaining Longevity: 11.2
Brady Statistic RV Percent Paced: 0.1 % — CL
HighPow Impedance: 71 Ohm
Lead Channel Impedance Value: 494 Ohm
Lead Channel Pacing Threshold Amplitude: 0.5 V
Lead Channel Pacing Threshold Pulse Width: 0.4 ms
Lead Channel Sensing Intrinsic Amplitude: 15.1 mV
Lead Channel Setting Pacing Amplitude: 2 V
Lead Channel Setting Pacing Pulse Width: 0.4 ms
Lead Channel Setting Sensing Sensitivity: 0.3 mV
Zone Setting Detection Interval: 300 ms
Zone Setting Detection Interval: 370.3 ms

## 2013-12-17 ENCOUNTER — Ambulatory Visit (INDEPENDENT_AMBULATORY_CARE_PROVIDER_SITE_OTHER): Payer: Commercial Managed Care - PPO | Admitting: *Deleted

## 2013-12-17 DIAGNOSIS — I469 Cardiac arrest, cause unspecified: Secondary | ICD-10-CM

## 2013-12-17 LAB — ICD DEVICE OBSERVATION

## 2013-12-19 LAB — MDC_IDC_ENUM_SESS_TYPE_REMOTE
Battery Remaining Longevity: 134 mo
Brady Statistic RV Percent Paced: 0.01 %
Date Time Interrogation Session: 20150126150312
HighPow Impedance: 171 Ohm
HighPow Impedance: 73 Ohm
Lead Channel Impedance Value: 494 Ohm
Lead Channel Pacing Threshold Amplitude: 0.625 V
Lead Channel Sensing Intrinsic Amplitude: 15.125 mV
Lead Channel Setting Pacing Amplitude: 2 V
Lead Channel Setting Sensing Sensitivity: 0.3 mV
MDC IDC MSMT BATTERY VOLTAGE: 3.03 V
MDC IDC MSMT LEADCHNL RV PACING THRESHOLD PULSEWIDTH: 0.4 ms
MDC IDC SET LEADCHNL RV PACING PULSEWIDTH: 0.4 ms
MDC IDC SET ZONE DETECTION INTERVAL: 360 ms
MDC IDC SET ZONE DETECTION INTERVAL: 370 ms
Zone Setting Detection Interval: 300 ms

## 2013-12-24 ENCOUNTER — Telehealth: Payer: Self-pay | Admitting: Cardiovascular Disease

## 2013-12-24 NOTE — Telephone Encounter (Signed)
Spoke to patient. [name] [name] states he received a letter from [employer] - owes $10,000 to the company. He states he is very livid about the situation. He wants to know why this was not pre- authorized with [employer] Patient states the letter states the lifevest was no medically necessary.Patient wanted to know what Dr Claiborne Billings was going to do ,he stated this should have been handle prior to him getting this life vest. He states he is on a fixed income and he states  very upset.   RN informed patient that [employer] does the authorization. He should contact [employer]. Patient states he will not call because he did not ask to have life vest. Patient states the office should handle this because Dr Claiborne Billings ordered the life vest. RN Iinformed patient - call rep/company to have someone to assist him as well as make Dr Claiborne Billings aware.

## 2013-12-24 NOTE — Telephone Encounter (Signed)
ok 

## 2013-12-24 NOTE — Telephone Encounter (Signed)
Called and a spoke Kerrin Mo [employer] REP. RN informed  Eulas Post that patient received a letter -bill for [employer] life vest. Eulas Post obtain patient's name and DOB. -he states he will contact reimbursement and a call the patient. Will notify patient

## 2013-12-24 NOTE — Telephone Encounter (Signed)
Left message on answer machine - contacted a [employer] rep --look into the situation ans contact patient.

## 2013-12-24 NOTE — Telephone Encounter (Signed)
Left message to call back  

## 2013-12-24 NOTE — Telephone Encounter (Signed)
Pt has a Microbiologist and says he have some issues with it. He wants somebody to call him please.

## 2013-12-26 ENCOUNTER — Other Ambulatory Visit: Payer: Self-pay | Admitting: Cardiovascular Disease

## 2013-12-26 ENCOUNTER — Other Ambulatory Visit (HOSPITAL_COMMUNITY): Payer: Self-pay | Admitting: Cardiology

## 2013-12-26 ENCOUNTER — Encounter: Payer: Self-pay | Admitting: *Deleted

## 2014-01-24 ENCOUNTER — Other Ambulatory Visit: Payer: Self-pay | Admitting: Cardiovascular Disease

## 2014-01-24 ENCOUNTER — Other Ambulatory Visit (HOSPITAL_COMMUNITY): Payer: Self-pay | Admitting: Cardiology

## 2014-02-25 ENCOUNTER — Other Ambulatory Visit: Payer: Self-pay | Admitting: *Deleted

## 2014-02-25 MED ORDER — AMIODARONE HCL 200 MG PO TABS: 200.0000 mg | ORAL_TABLET | Freq: Two times a day (BID) | ORAL | Status: AC

## 2014-02-25 NOTE — Telephone Encounter (Signed)
Rx refill sent to patients pharmacy  

## 2014-02-26 ENCOUNTER — Ambulatory Visit: Payer: Self-pay | Admitting: Oncology

## 2014-02-27 LAB — CBC CANCER CENTER
Basophil #: 0 x10 3/mm (ref 0.0–0.1)
Basophil %: 0.2 %
EOS PCT: 2 %
Eosinophil #: 0.2 x10 3/mm (ref 0.0–0.7)
HCT: 43.5 % (ref 40.0–52.0)
HGB: 14.4 g/dL (ref 13.0–18.0)
LYMPHS PCT: 13.1 %
Lymphocyte #: 1.1 x10 3/mm (ref 1.0–3.6)
MCH: 31.5 pg (ref 26.0–34.0)
MCHC: 33.1 g/dL (ref 32.0–36.0)
MCV: 95 fL (ref 80–100)
MONO ABS: 1 x10 3/mm (ref 0.2–1.0)
Monocyte %: 11.9 %
NEUTROS ABS: 6.3 x10 3/mm (ref 1.4–6.5)
Neutrophil %: 72.8 %
PLATELETS: 250 x10 3/mm (ref 150–440)
RBC: 4.58 10*6/uL (ref 4.40–5.90)
RDW: 15.1 % — ABNORMAL HIGH (ref 11.5–14.5)
WBC: 8.7 x10 3/mm (ref 3.8–10.6)

## 2014-02-27 LAB — COMPREHENSIVE METABOLIC PANEL
ANION GAP: 7 (ref 7–16)
AST: 53 U/L — AB (ref 15–37)
Albumin: 3.6 g/dL (ref 3.4–5.0)
Alkaline Phosphatase: 75 U/L
BILIRUBIN TOTAL: 0.7 mg/dL (ref 0.2–1.0)
BUN: 15 mg/dL (ref 7–18)
CREATININE: 1.08 mg/dL (ref 0.60–1.30)
Calcium, Total: 9.4 mg/dL (ref 8.5–10.1)
Chloride: 97 mmol/L — ABNORMAL LOW (ref 98–107)
Co2: 33 mmol/L — ABNORMAL HIGH (ref 21–32)
EGFR (Non-African Amer.): >60
Glucose: 114 mg/dL — ABNORMAL HIGH (ref 65–99)
OSMOLALITY: 276 (ref 275–301)
POTASSIUM: 4.9 mmol/L (ref 3.5–5.1)
SGPT (ALT): 48 U/L (ref 12–78)
SODIUM: 137 mmol/L (ref 136–145)
TOTAL PROTEIN: 7.2 g/dL (ref 6.4–8.2)

## 2014-03-08 ENCOUNTER — Ambulatory Visit (INDEPENDENT_AMBULATORY_CARE_PROVIDER_SITE_OTHER): Payer: Commercial Managed Care - PPO | Admitting: *Deleted

## 2014-03-08 DIAGNOSIS — I4729 Other ventricular tachycardia: Secondary | ICD-10-CM

## 2014-03-08 DIAGNOSIS — I469 Cardiac arrest, cause unspecified: Secondary | ICD-10-CM

## 2014-03-08 DIAGNOSIS — I255 Ischemic cardiomyopathy: Secondary | ICD-10-CM

## 2014-03-08 DIAGNOSIS — I472 Ventricular tachycardia, unspecified: Secondary | ICD-10-CM

## 2014-03-08 DIAGNOSIS — I2589 Other forms of chronic ischemic heart disease: Secondary | ICD-10-CM

## 2014-03-08 LAB — PACEMAKER DEVICE OBSERVATION

## 2014-03-14 LAB — MDC_IDC_ENUM_SESS_TYPE_INCLINIC
Battery Remaining Longevity: 11.1
Brady Statistic RV Percent Paced: 0.1 % — CL
HIGH POWER IMPEDANCE MEASURED VALUE: 74 Ohm
Lead Channel Pacing Threshold Amplitude: 0.75 V
Lead Channel Setting Sensing Sensitivity: 0.3 mV
MDC IDC MSMT LEADCHNL RV IMPEDANCE VALUE: 494 Ohm
MDC IDC MSMT LEADCHNL RV PACING THRESHOLD PULSEWIDTH: 0.4 ms
MDC IDC MSMT LEADCHNL RV SENSING INTR AMPL: 20 mV — AB
MDC IDC SET LEADCHNL RV PACING AMPLITUDE: 2 V
MDC IDC SET LEADCHNL RV PACING PULSEWIDTH: 0.4 ms
MDC IDC SET ZONE DETECTION INTERVAL: 300 ms
Zone Setting Detection Interval: 360 ms
Zone Setting Detection Interval: 370 ms

## 2014-03-14 NOTE — Progress Notes (Signed)
ICD check in clinic. Normal device function. Threshold and sensing consistent with previous device measurements. Impedance trends stable over time. No evidence of any ventricular arrhythmias. Stable thoracic impedance. Histogram distribution appropriate for patient and level of activity. No changes made this session. Device programmed at appropriate safety margins. Device programmed to optimize intrinsic conduction. Estimated longevity 11.1 years. Pt plans to follow up with a Cardiology group in [facility] due to insurance issues. Patient aware that he should follow up with them within the next 3 months.

## 2014-03-18 ENCOUNTER — Other Ambulatory Visit: Payer: Self-pay | Admitting: *Deleted

## 2014-03-18 ENCOUNTER — Other Ambulatory Visit (HOSPITAL_COMMUNITY): Payer: Self-pay | Admitting: Cardiology

## 2014-03-18 NOTE — Telephone Encounter (Signed)
Rx was sent to pharmacy electronically. 

## 2014-03-22 ENCOUNTER — Ambulatory Visit: Payer: Self-pay | Admitting: Oncology

## 2014-04-16 ENCOUNTER — Other Ambulatory Visit: Payer: Self-pay

## 2014-04-16 MED ORDER — LEVOTHYROXINE SODIUM 75 MCG PO TABS: 75.0000 ug | ORAL_TABLET | Freq: Every day | ORAL | Status: AC

## 2014-04-16 NOTE — Telephone Encounter (Signed)
Rx was sent to pharmacy electronically. 

## 2014-06-23 ENCOUNTER — Other Ambulatory Visit (HOSPITAL_COMMUNITY): Payer: Self-pay | Admitting: Cardiology

## 2014-06-23 ENCOUNTER — Other Ambulatory Visit: Payer: Self-pay | Admitting: Cardiovascular Disease

## 2014-06-24 ENCOUNTER — Other Ambulatory Visit: Payer: Self-pay | Admitting: *Deleted

## 2014-06-24 MED ORDER — CLOPIDOGREL BISULFATE 75 MG PO TABS
75.0000 mg | ORAL_TABLET | Freq: Every day | ORAL | Status: AC
Start: 2014-06-24 — End: ?

## 2014-06-24 NOTE — Telephone Encounter (Signed)
Rx refill sent to patient pharmacy   

## 2014-06-24 NOTE — Telephone Encounter (Signed)
Rx refill denied to pharmacy, Rx was filled 03/18/14 #30 and 7 additional

## 2014-07-23 ENCOUNTER — Telehealth: Payer: Self-pay | Admitting: Cardiovascular Disease

## 2014-07-23 NOTE — Telephone Encounter (Signed)
Claiborne Billings was calling regarding a release of information that was faxed to our office on 06/26/14 for records on this patient.  She states that she has spoken with our medical records team and has faxed her request 4 times and still does not have the records.  I had Camie Patience, one of our HIM specialist call [employer] regarding this issue.  Invoice # [id number] was processed 07/04/14---mailed on 07/10/14 and arrived at the local postal distribution center on 07/17/14.  She was also told that [employer] would be glad to reprocess this request, but it would take a another 10 days for this to complete.  Claiborne Billings states that she will be on the look out for these records and will call us back if they are not received soon.

## 2014-08-22 DEATH — deceased

## 2014-10-31 ENCOUNTER — Encounter (HOSPITAL_COMMUNITY): Payer: Self-pay | Admitting: Cardiovascular Disease
